# Patient Record
Sex: Male | Born: 1955 | Race: Black or African American | Hispanic: No | Marital: Married | Smoking: Never smoker
Health system: Southern US, Community
[De-identification: ages and names within clinical notes are randomized; demographics above are authoritative.]

## PROBLEM LIST (undated history)

## (undated) DIAGNOSIS — R600 Localized edema: Secondary | ICD-10-CM

## (undated) DIAGNOSIS — I10 Essential (primary) hypertension: Secondary | ICD-10-CM

## (undated) DIAGNOSIS — I219 Acute myocardial infarction, unspecified: Secondary | ICD-10-CM

## (undated) DIAGNOSIS — R0600 Dyspnea, unspecified: Secondary | ICD-10-CM

## (undated) DIAGNOSIS — E119 Type 2 diabetes mellitus without complications: Secondary | ICD-10-CM

## (undated) DIAGNOSIS — U071 COVID-19: Secondary | ICD-10-CM

## (undated) DIAGNOSIS — N185 Chronic kidney disease, stage 5: Secondary | ICD-10-CM

## (undated) DIAGNOSIS — K219 Gastro-esophageal reflux disease without esophagitis: Secondary | ICD-10-CM

## (undated) HISTORY — PX: CHOLECYSTECTOMY: SHX55

## (undated) HISTORY — PX: TOE SURGERY: SHX1073

---

## 2006-01-18 ENCOUNTER — Other Ambulatory Visit: Payer: Self-pay

## 2006-01-18 ENCOUNTER — Emergency Department: Payer: Self-pay | Admitting: Unknown Physician Specialty

## 2006-01-18 ENCOUNTER — Emergency Department: Payer: Self-pay | Admitting: Emergency Medicine

## 2006-03-22 ENCOUNTER — Emergency Department: Payer: Self-pay | Admitting: Emergency Medicine

## 2006-03-23 ENCOUNTER — Emergency Department: Payer: Self-pay | Admitting: Emergency Medicine

## 2006-05-25 ENCOUNTER — Emergency Department: Payer: Self-pay | Admitting: Emergency Medicine

## 2006-07-09 ENCOUNTER — Emergency Department: Payer: Self-pay | Admitting: Emergency Medicine

## 2006-08-16 ENCOUNTER — Ambulatory Visit: Payer: Self-pay | Admitting: Gastroenterology

## 2006-08-21 ENCOUNTER — Emergency Department: Payer: Self-pay | Admitting: Unknown Physician Specialty

## 2006-08-23 ENCOUNTER — Ambulatory Visit: Payer: Self-pay | Admitting: Unknown Physician Specialty

## 2006-08-23 ENCOUNTER — Inpatient Hospital Stay: Payer: Self-pay | Admitting: Unknown Physician Specialty

## 2006-08-23 ENCOUNTER — Other Ambulatory Visit: Payer: Self-pay

## 2006-10-12 ENCOUNTER — Other Ambulatory Visit: Payer: Self-pay

## 2006-10-12 ENCOUNTER — Emergency Department: Payer: Self-pay | Admitting: Unknown Physician Specialty

## 2006-12-24 ENCOUNTER — Emergency Department: Payer: Self-pay | Admitting: Emergency Medicine

## 2006-12-24 ENCOUNTER — Other Ambulatory Visit: Payer: Self-pay

## 2007-02-17 ENCOUNTER — Emergency Department: Payer: Self-pay | Admitting: Emergency Medicine

## 2007-04-07 ENCOUNTER — Emergency Department: Payer: Self-pay | Admitting: Emergency Medicine

## 2007-04-07 ENCOUNTER — Other Ambulatory Visit: Payer: Self-pay

## 2007-05-11 ENCOUNTER — Emergency Department: Payer: Self-pay | Admitting: Emergency Medicine

## 2007-05-11 ENCOUNTER — Other Ambulatory Visit: Payer: Self-pay

## 2007-06-02 ENCOUNTER — Other Ambulatory Visit: Payer: Self-pay

## 2007-06-02 ENCOUNTER — Emergency Department: Payer: Self-pay | Admitting: Emergency Medicine

## 2007-11-09 ENCOUNTER — Emergency Department: Payer: Self-pay | Admitting: Emergency Medicine

## 2009-10-30 ENCOUNTER — Ambulatory Visit: Payer: Self-pay | Admitting: Internal Medicine

## 2009-11-06 ENCOUNTER — Ambulatory Visit: Payer: Self-pay | Admitting: Internal Medicine

## 2009-12-03 ENCOUNTER — Emergency Department: Payer: Self-pay | Admitting: Emergency Medicine

## 2009-12-06 ENCOUNTER — Ambulatory Visit: Payer: Self-pay | Admitting: Internal Medicine

## 2009-12-06 ENCOUNTER — Inpatient Hospital Stay: Payer: Self-pay | Admitting: Internal Medicine

## 2010-12-03 ENCOUNTER — Ambulatory Visit: Payer: Self-pay | Admitting: Podiatry

## 2010-12-05 ENCOUNTER — Ambulatory Visit: Payer: Self-pay | Admitting: Podiatry

## 2010-12-08 LAB — PATHOLOGY REPORT

## 2011-09-14 DIAGNOSIS — N529 Male erectile dysfunction, unspecified: Secondary | ICD-10-CM | POA: Insufficient documentation

## 2012-03-25 DIAGNOSIS — E782 Mixed hyperlipidemia: Secondary | ICD-10-CM | POA: Insufficient documentation

## 2012-03-25 DIAGNOSIS — E559 Vitamin D deficiency, unspecified: Secondary | ICD-10-CM | POA: Insufficient documentation

## 2012-06-17 ENCOUNTER — Other Ambulatory Visit: Payer: Self-pay | Admitting: Podiatry

## 2012-06-23 LAB — WOUND CULTURE

## 2012-07-05 ENCOUNTER — Ambulatory Visit: Payer: Self-pay | Admitting: Podiatry

## 2012-07-05 LAB — BASIC METABOLIC PANEL
Anion Gap: 7 (ref 7–16)
BUN: 29 mg/dL — ABNORMAL HIGH (ref 7–18)
Creatinine: 1.41 mg/dL — ABNORMAL HIGH (ref 0.60–1.30)
EGFR (African American): >60
EGFR (Non-African Amer.): 55 — ABNORMAL LOW
Glucose: 325 mg/dL — ABNORMAL HIGH (ref 65–99)
Osmolality: 290 (ref 275–301)
Potassium: 3.9 mmol/L (ref 3.5–5.1)

## 2012-07-15 ENCOUNTER — Ambulatory Visit: Payer: Self-pay | Admitting: Podiatry

## 2012-07-19 LAB — WOUND CULTURE

## 2012-07-20 LAB — PATHOLOGY REPORT

## 2013-11-03 ENCOUNTER — Emergency Department: Payer: Self-pay | Admitting: Internal Medicine

## 2013-11-03 LAB — CBC WITH DIFFERENTIAL/PLATELET
BASOS PCT: 0.4 %
Basophil #: 0.1 10*3/uL (ref 0.0–0.1)
EOS PCT: 0.4 %
Eosinophil #: 0.1 10*3/uL (ref 0.0–0.7)
HCT: 35.6 % — AB (ref 40.0–52.0)
HGB: 11.4 g/dL — ABNORMAL LOW (ref 13.0–18.0)
Lymphocyte #: 1.1 10*3/uL (ref 1.0–3.6)
Lymphocyte %: 8.6 %
MCH: 27.8 pg (ref 26.0–34.0)
MCHC: 31.9 g/dL — AB (ref 32.0–36.0)
MCV: 87 fL (ref 80–100)
MONO ABS: 1.2 x10 3/mm — AB (ref 0.2–1.0)
MONOS PCT: 9.3 %
Neutrophil #: 10.6 10*3/uL — ABNORMAL HIGH (ref 1.4–6.5)
Neutrophil %: 81.3 %
PLATELETS: 174 10*3/uL (ref 150–440)
RBC: 4.09 10*6/uL — ABNORMAL LOW (ref 4.40–5.90)
RDW: 14.7 % — ABNORMAL HIGH (ref 11.5–14.5)
WBC: 13 10*3/uL — AB (ref 3.8–10.6)

## 2013-11-03 LAB — COMPREHENSIVE METABOLIC PANEL
ANION GAP: 6 — AB (ref 7–16)
Albumin: 2.8 g/dL — ABNORMAL LOW (ref 3.4–5.0)
Alkaline Phosphatase: 240 U/L — ABNORMAL HIGH
BUN: 20 mg/dL — ABNORMAL HIGH (ref 7–18)
Bilirubin,Total: 0.8 mg/dL (ref 0.2–1.0)
CO2: 25 mmol/L (ref 21–32)
Calcium, Total: 8.9 mg/dL (ref 8.5–10.1)
Chloride: 101 mmol/L (ref 98–107)
Creatinine: 1.46 mg/dL — ABNORMAL HIGH (ref 0.60–1.30)
GFR CALC NON AF AMER: 52 — AB
Glucose: 246 mg/dL — ABNORMAL HIGH (ref 65–99)
Osmolality: 275 (ref 275–301)
POTASSIUM: 3.8 mmol/L (ref 3.5–5.1)
SGOT(AST): 93 U/L — ABNORMAL HIGH (ref 15–37)
SGPT (ALT): 82 U/L — ABNORMAL HIGH (ref 12–78)
SODIUM: 132 mmol/L — AB (ref 136–145)
Total Protein: 7.5 g/dL (ref 6.4–8.2)

## 2013-11-06 ENCOUNTER — Inpatient Hospital Stay: Payer: Self-pay | Admitting: Internal Medicine

## 2013-11-06 LAB — COMPREHENSIVE METABOLIC PANEL
ALK PHOS: 532 U/L — AB
ALT: 85 U/L — AB (ref 12–78)
Albumin: 2.5 g/dL — ABNORMAL LOW (ref 3.4–5.0)
Anion Gap: 7 (ref 7–16)
BUN: 19 mg/dL — AB (ref 7–18)
Bilirubin,Total: 0.5 mg/dL (ref 0.2–1.0)
CALCIUM: 8.4 mg/dL — AB (ref 8.5–10.1)
CREATININE: 1.67 mg/dL — AB (ref 0.60–1.30)
Chloride: 96 mmol/L — ABNORMAL LOW (ref 98–107)
Co2: 29 mmol/L (ref 21–32)
GFR CALC AF AMER: 51 — AB
GFR CALC NON AF AMER: 44 — AB
Glucose: 132 mg/dL — ABNORMAL HIGH (ref 65–99)
OSMOLALITY: 269 (ref 275–301)
Potassium: 3.9 mmol/L (ref 3.5–5.1)
SGOT(AST): 44 U/L — ABNORMAL HIGH (ref 15–37)
SODIUM: 132 mmol/L — AB (ref 136–145)
TOTAL PROTEIN: 8.1 g/dL (ref 6.4–8.2)

## 2013-11-06 LAB — CBC WITH DIFFERENTIAL/PLATELET
BASOS ABS: 0.1 10*3/uL (ref 0.0–0.1)
BASOS PCT: 0.3 %
Eosinophil #: 0.1 10*3/uL (ref 0.0–0.7)
Eosinophil %: 0.4 %
HCT: 35 % — AB (ref 40.0–52.0)
HGB: 11.4 g/dL — AB (ref 13.0–18.0)
Lymphocyte #: 1.8 10*3/uL (ref 1.0–3.6)
Lymphocyte %: 11.2 %
MCH: 28 pg (ref 26.0–34.0)
MCHC: 32.6 g/dL (ref 32.0–36.0)
MCV: 86 fL (ref 80–100)
MONO ABS: 1.9 x10 3/mm — AB (ref 0.2–1.0)
Monocyte %: 12 %
Neutrophil #: 12.2 10*3/uL — ABNORMAL HIGH (ref 1.4–6.5)
Neutrophil %: 76.1 %
PLATELETS: 279 10*3/uL (ref 150–440)
RBC: 4.07 10*6/uL — AB (ref 4.40–5.90)
RDW: 14.6 % — ABNORMAL HIGH (ref 11.5–14.5)
WBC: 16.1 10*3/uL — ABNORMAL HIGH (ref 3.8–10.6)

## 2013-11-07 LAB — CBC WITH DIFFERENTIAL/PLATELET
Basophil #: 0.1 10*3/uL (ref 0.0–0.1)
Basophil %: 0.5 %
Eosinophil #: 0.1 10*3/uL (ref 0.0–0.7)
Eosinophil %: 1.1 %
HCT: 32.8 % — ABNORMAL LOW (ref 40.0–52.0)
HGB: 10.8 g/dL — AB (ref 13.0–18.0)
LYMPHS PCT: 11.5 %
Lymphocyte #: 1.5 10*3/uL (ref 1.0–3.6)
MCH: 28.3 pg (ref 26.0–34.0)
MCHC: 32.9 g/dL (ref 32.0–36.0)
MCV: 86 fL (ref 80–100)
Monocyte #: 1.6 x10 3/mm — ABNORMAL HIGH (ref 0.2–1.0)
Monocyte %: 12 %
Neutrophil #: 9.8 10*3/uL — ABNORMAL HIGH (ref 1.4–6.5)
Neutrophil %: 74.9 %
PLATELETS: 261 10*3/uL (ref 150–440)
RBC: 3.82 10*6/uL — AB (ref 4.40–5.90)
RDW: 14.5 % (ref 11.5–14.5)
WBC: 13 10*3/uL — AB (ref 3.8–10.6)

## 2013-11-07 LAB — BASIC METABOLIC PANEL
Anion Gap: 6 — ABNORMAL LOW (ref 7–16)
BUN: 21 mg/dL — AB (ref 7–18)
CO2: 27 mmol/L (ref 21–32)
CREATININE: 1.76 mg/dL — AB (ref 0.60–1.30)
Calcium, Total: 7.9 mg/dL — ABNORMAL LOW (ref 8.5–10.1)
Chloride: 100 mmol/L (ref 98–107)
EGFR (African American): 48 — ABNORMAL LOW
EGFR (Non-African Amer.): 41 — ABNORMAL LOW
GLUCOSE: 120 mg/dL — AB (ref 65–99)
Osmolality: 271 (ref 275–301)
Potassium: 3.4 mmol/L — ABNORMAL LOW (ref 3.5–5.1)
Sodium: 133 mmol/L — ABNORMAL LOW (ref 136–145)

## 2013-11-07 LAB — URINALYSIS, COMPLETE
BACTERIA: NONE SEEN
Bilirubin,UR: NEGATIVE
Blood: NEGATIVE
Glucose,UR: NEGATIVE mg/dL (ref 0–75)
Hyaline Cast: 3
Ketone: NEGATIVE
LEUKOCYTE ESTERASE: NEGATIVE
Nitrite: NEGATIVE
Ph: 5 (ref 4.5–8.0)
Protein: 30
SPECIFIC GRAVITY: 1.014 (ref 1.003–1.030)
Squamous Epithelial: NONE SEEN
WBC UR: 1 /HPF (ref 0–5)

## 2013-11-08 LAB — BASIC METABOLIC PANEL
Anion Gap: 6 — ABNORMAL LOW (ref 7–16)
BUN: 18 mg/dL (ref 7–18)
CHLORIDE: 103 mmol/L (ref 98–107)
Calcium, Total: 8.1 mg/dL — ABNORMAL LOW (ref 8.5–10.1)
Co2: 27 mmol/L (ref 21–32)
Creatinine: 1.54 mg/dL — ABNORMAL HIGH (ref 0.60–1.30)
EGFR (Non-African Amer.): 49 — ABNORMAL LOW
GFR CALC AF AMER: 56 — AB
Glucose: 108 mg/dL — ABNORMAL HIGH (ref 65–99)
OSMOLALITY: 274 (ref 275–301)
Potassium: 3.7 mmol/L (ref 3.5–5.1)
SODIUM: 136 mmol/L (ref 136–145)

## 2013-11-08 LAB — CBC WITH DIFFERENTIAL/PLATELET
BASOS ABS: 0.1 10*3/uL (ref 0.0–0.1)
Basophil %: 0.6 %
EOS ABS: 0.2 10*3/uL (ref 0.0–0.7)
EOS PCT: 1.5 %
HCT: 32.7 % — ABNORMAL LOW (ref 40.0–52.0)
HGB: 10.6 g/dL — AB (ref 13.0–18.0)
LYMPHS ABS: 2 10*3/uL (ref 1.0–3.6)
LYMPHS PCT: 15.1 %
MCH: 27.8 pg (ref 26.0–34.0)
MCHC: 32.2 g/dL (ref 32.0–36.0)
MCV: 86 fL (ref 80–100)
MONO ABS: 1.6 x10 3/mm — AB (ref 0.2–1.0)
Monocyte %: 12.1 %
NEUTROS ABS: 9.6 10*3/uL — AB (ref 1.4–6.5)
Neutrophil %: 70.7 %
PLATELETS: 302 10*3/uL (ref 150–440)
RBC: 3.79 10*6/uL — ABNORMAL LOW (ref 4.40–5.90)
RDW: 14.7 % — ABNORMAL HIGH (ref 11.5–14.5)
WBC: 13.6 10*3/uL — AB (ref 3.8–10.6)

## 2013-11-08 LAB — CULTURE, BLOOD (SINGLE)

## 2013-11-08 LAB — HEMOGLOBIN A1C: HEMOGLOBIN A1C: 10.7 % — AB (ref 4.2–6.3)

## 2013-11-09 LAB — SEDIMENTATION RATE: Erythrocyte Sed Rate: >140 mm/hr — ABNORMAL HIGH (ref 0–20)

## 2013-11-09 LAB — WOUND CULTURE

## 2013-11-10 LAB — CULTURE, BLOOD (SINGLE)

## 2013-11-11 LAB — CULTURE, BLOOD (SINGLE)

## 2013-11-11 LAB — WOUND CULTURE

## 2013-11-14 LAB — PATHOLOGY REPORT

## 2013-11-19 ENCOUNTER — Inpatient Hospital Stay: Payer: Self-pay | Admitting: Family Medicine

## 2013-11-19 LAB — CBC WITH DIFFERENTIAL/PLATELET
BASOS ABS: 0.1 10*3/uL (ref 0.0–0.1)
BASOS PCT: 0.8 %
Eosinophil #: 0.1 10*3/uL (ref 0.0–0.7)
Eosinophil %: 0.9 %
HCT: 36.1 % — AB (ref 40.0–52.0)
HGB: 12 g/dL — ABNORMAL LOW (ref 13.0–18.0)
LYMPHS PCT: 5.2 %
Lymphocyte #: 0.7 10*3/uL — ABNORMAL LOW (ref 1.0–3.6)
MCH: 28.4 pg (ref 26.0–34.0)
MCHC: 33.3 g/dL (ref 32.0–36.0)
MCV: 85 fL (ref 80–100)
MONOS PCT: 2.3 %
Monocyte #: 0.3 x10 3/mm (ref 0.2–1.0)
Neutrophil #: 11.6 10*3/uL — ABNORMAL HIGH (ref 1.4–6.5)
Neutrophil %: 90.8 %
PLATELETS: 399 10*3/uL (ref 150–440)
RBC: 4.24 10*6/uL — ABNORMAL LOW (ref 4.40–5.90)
RDW: 14.6 % — ABNORMAL HIGH (ref 11.5–14.5)
WBC: 12.8 10*3/uL — ABNORMAL HIGH (ref 3.8–10.6)

## 2013-11-19 LAB — COMPREHENSIVE METABOLIC PANEL
ALK PHOS: 197 U/L — AB
ANION GAP: 8 (ref 7–16)
Albumin: 2.8 g/dL — ABNORMAL LOW (ref 3.4–5.0)
BUN: 47 mg/dL — AB (ref 7–18)
Bilirubin,Total: 0.4 mg/dL (ref 0.2–1.0)
CALCIUM: 8.7 mg/dL (ref 8.5–10.1)
CO2: 26 mmol/L (ref 21–32)
CREATININE: 4.82 mg/dL — AB (ref 0.60–1.30)
Chloride: 101 mmol/L (ref 98–107)
EGFR (African American): 14 — ABNORMAL LOW
GFR CALC NON AF AMER: 12 — AB
Glucose: 308 mg/dL — ABNORMAL HIGH (ref 65–99)
OSMOLALITY: 294 (ref 275–301)
POTASSIUM: 4.4 mmol/L (ref 3.5–5.1)
SGOT(AST): 20 U/L (ref 15–37)
SGPT (ALT): 22 U/L (ref 12–78)
SODIUM: 135 mmol/L — AB (ref 136–145)
Total Protein: 9 g/dL — ABNORMAL HIGH (ref 6.4–8.2)

## 2013-11-19 LAB — URINALYSIS, COMPLETE
BACTERIA: NONE SEEN
Bilirubin,UR: NEGATIVE
Glucose,UR: 150 mg/dL (ref 0–75)
Ketone: NEGATIVE
Leukocyte Esterase: NEGATIVE
Nitrite: NEGATIVE
Ph: 6 (ref 4.5–8.0)
Protein: 30
RBC,UR: 2 /HPF (ref 0–5)
SQUAMOUS EPITHELIAL: NONE SEEN
Specific Gravity: 1.008 (ref 1.003–1.030)

## 2013-11-19 LAB — TROPONIN I: Troponin-I: <0.02 ng/mL

## 2013-11-19 LAB — LIPASE, BLOOD: LIPASE: 162 U/L (ref 73–393)

## 2013-11-20 LAB — CBC WITH DIFFERENTIAL/PLATELET
Basophil #: 0 10*3/uL (ref 0.0–0.1)
Basophil %: 0.3 %
Eosinophil #: 0 10*3/uL (ref 0.0–0.7)
Eosinophil %: 0 %
HCT: 35.3 % — ABNORMAL LOW (ref 40.0–52.0)
HGB: 11.5 g/dL — ABNORMAL LOW (ref 13.0–18.0)
LYMPHS PCT: 7 %
Lymphocyte #: 1 10*3/uL (ref 1.0–3.6)
MCH: 28 pg (ref 26.0–34.0)
MCHC: 32.7 g/dL (ref 32.0–36.0)
MCV: 86 fL (ref 80–100)
MONO ABS: 0.7 x10 3/mm (ref 0.2–1.0)
MONOS PCT: 4.8 %
NEUTROS ABS: 12 10*3/uL — AB (ref 1.4–6.5)
Neutrophil %: 87.9 %
Platelet: 402 10*3/uL (ref 150–440)
RBC: 4.11 10*6/uL — ABNORMAL LOW (ref 4.40–5.90)
RDW: 15 % — ABNORMAL HIGH (ref 11.5–14.5)
WBC: 13.7 10*3/uL — ABNORMAL HIGH (ref 3.8–10.6)

## 2013-11-20 LAB — BASIC METABOLIC PANEL
Anion Gap: 9 (ref 7–16)
BUN: 50 mg/dL — ABNORMAL HIGH (ref 7–18)
Calcium, Total: 9 mg/dL (ref 8.5–10.1)
Chloride: 103 mmol/L (ref 98–107)
Co2: 23 mmol/L (ref 21–32)
Creatinine: 4.48 mg/dL — ABNORMAL HIGH (ref 0.60–1.30)
EGFR (African American): 16 — ABNORMAL LOW
EGFR (Non-African Amer.): 13 — ABNORMAL LOW
Glucose: 262 mg/dL — ABNORMAL HIGH (ref 65–99)
Osmolality: 293 (ref 275–301)
POTASSIUM: 4.2 mmol/L (ref 3.5–5.1)
SODIUM: 135 mmol/L — AB (ref 136–145)

## 2013-11-20 LAB — PROTEIN / CREATININE RATIO, URINE
CREATININE, URINE: 85.9 mg/dL (ref 30.0–125.0)
PROTEIN/CREAT. RATIO: 885 mg/g{creat} — AB (ref 0–200)
Protein, Random Urine: 76 mg/dL — ABNORMAL HIGH (ref 0–12)

## 2013-11-20 LAB — OCCULT BLOOD X 1 CARD TO LAB, STOOL: Occult Blood, Feces: NEGATIVE

## 2013-11-21 LAB — CBC WITH DIFFERENTIAL/PLATELET
COMMENT - H1-COM1: NORMAL
COMMENT - H1-COM2: NORMAL
Eosinophil: 1 %
HCT: 40.3 % (ref 40.0–52.0)
HGB: 12.9 g/dL — ABNORMAL LOW (ref 13.0–18.0)
LYMPHS PCT: 15 %
MCH: 27.8 pg (ref 26.0–34.0)
MCHC: 32 g/dL (ref 32.0–36.0)
MCV: 87 fL (ref 80–100)
Monocytes: 4 %
Platelet: 391 10*3/uL (ref 150–440)
RBC: 4.64 10*6/uL (ref 4.40–5.90)
RDW: 15.3 % — AB (ref 11.5–14.5)
Segmented Neutrophils: 79 %
Variant Lymphocyte - H1-Rlymph: 1 %
WBC: 14.2 10*3/uL — AB (ref 3.8–10.6)

## 2013-11-21 LAB — BASIC METABOLIC PANEL
ANION GAP: 10 (ref 7–16)
BUN: 55 mg/dL — ABNORMAL HIGH (ref 7–18)
CREATININE: 4.21 mg/dL — AB (ref 0.60–1.30)
Calcium, Total: 8.8 mg/dL (ref 8.5–10.1)
Chloride: 103 mmol/L (ref 98–107)
Co2: 20 mmol/L — ABNORMAL LOW (ref 21–32)
EGFR (Non-African Amer.): 14 — ABNORMAL LOW
GFR CALC AF AMER: 17 — AB
Glucose: 209 mg/dL — ABNORMAL HIGH (ref 65–99)
OSMOLALITY: 288 (ref 275–301)
POTASSIUM: 4.2 mmol/L (ref 3.5–5.1)
Sodium: 133 mmol/L — ABNORMAL LOW (ref 136–145)

## 2013-11-21 LAB — UR PROT ELECTROPHORESIS, URINE RANDOM

## 2013-11-21 LAB — PROT IMMUNOELECTROPHORES(ARMC)

## 2013-11-21 LAB — KAPPA/LAMBDA FREE LIGHT CHAINS (ARMC)

## 2013-11-22 LAB — HEPATIC FUNCTION PANEL A (ARMC)
ALT: 32 U/L (ref 12–78)
AST: 16 U/L (ref 15–37)
Albumin: 2.8 g/dL — ABNORMAL LOW (ref 3.4–5.0)
Alkaline Phosphatase: 181 U/L — ABNORMAL HIGH
BILIRUBIN DIRECT: 0.1 mg/dL (ref 0.00–0.20)
BILIRUBIN TOTAL: 0.5 mg/dL (ref 0.2–1.0)
Total Protein: 8.2 g/dL (ref 6.4–8.2)

## 2013-11-22 LAB — BASIC METABOLIC PANEL
Anion Gap: 12 (ref 7–16)
BUN: 53 mg/dL — ABNORMAL HIGH (ref 7–18)
CALCIUM: 9.1 mg/dL (ref 8.5–10.1)
CHLORIDE: 100 mmol/L (ref 98–107)
Co2: 21 mmol/L (ref 21–32)
Creatinine: 4.06 mg/dL — ABNORMAL HIGH (ref 0.60–1.30)
EGFR (African American): 17 — ABNORMAL LOW
EGFR (Non-African Amer.): 15 — ABNORMAL LOW
Glucose: 268 mg/dL — ABNORMAL HIGH (ref 65–99)
Osmolality: 290 (ref 275–301)
POTASSIUM: 4.3 mmol/L (ref 3.5–5.1)
SODIUM: 133 mmol/L — AB (ref 136–145)

## 2013-11-23 LAB — BASIC METABOLIC PANEL
Anion Gap: 7 (ref 7–16)
BUN: 52 mg/dL — ABNORMAL HIGH (ref 7–18)
CREATININE: 3.65 mg/dL — AB (ref 0.60–1.30)
Calcium, Total: 8.7 mg/dL (ref 8.5–10.1)
Chloride: 104 mmol/L (ref 98–107)
Co2: 23 mmol/L (ref 21–32)
EGFR (African American): 20 — ABNORMAL LOW
EGFR (Non-African Amer.): 17 — ABNORMAL LOW
Glucose: 206 mg/dL — ABNORMAL HIGH (ref 65–99)
Osmolality: 288 (ref 275–301)
Potassium: 4.4 mmol/L (ref 3.5–5.1)
Sodium: 134 mmol/L — ABNORMAL LOW (ref 136–145)

## 2013-11-24 LAB — BASIC METABOLIC PANEL
Anion Gap: 10 (ref 7–16)
BUN: 51 mg/dL — AB (ref 7–18)
CO2: 23 mmol/L (ref 21–32)
Calcium, Total: 8.8 mg/dL (ref 8.5–10.1)
Chloride: 102 mmol/L (ref 98–107)
Creatinine: 3.79 mg/dL — ABNORMAL HIGH (ref 0.60–1.30)
EGFR (Non-African Amer.): 16 — ABNORMAL LOW
GFR CALC AF AMER: 19 — AB
Glucose: 266 mg/dL — ABNORMAL HIGH (ref 65–99)
OSMOLALITY: 293 (ref 275–301)
Potassium: 4.4 mmol/L (ref 3.5–5.1)
Sodium: 135 mmol/L — ABNORMAL LOW (ref 136–145)

## 2013-11-25 LAB — BASIC METABOLIC PANEL
Anion Gap: 9 (ref 7–16)
BUN: 44 mg/dL — AB (ref 7–18)
CHLORIDE: 105 mmol/L (ref 98–107)
CREATININE: 3.37 mg/dL — AB (ref 0.60–1.30)
Calcium, Total: 8.5 mg/dL (ref 8.5–10.1)
Co2: 24 mmol/L (ref 21–32)
EGFR (African American): 22 — ABNORMAL LOW
EGFR (Non-African Amer.): 19 — ABNORMAL LOW
Glucose: 150 mg/dL — ABNORMAL HIGH (ref 65–99)
Osmolality: 290 (ref 275–301)
Potassium: 4.3 mmol/L (ref 3.5–5.1)
SODIUM: 138 mmol/L (ref 136–145)

## 2013-11-25 LAB — PLATELET COUNT: PLATELETS: 246 10*3/uL (ref 150–440)

## 2013-12-25 ENCOUNTER — Emergency Department: Payer: Self-pay | Admitting: Emergency Medicine

## 2014-11-27 NOTE — Op Note (Signed)
PATIENT NAME:  Donald Silva, Donald Silva MR#:  [id number] DATE OF BIRTH:  1955-10-20  DATE OF PROCEDURE:  07/15/2012  PREOPERATIVE DIAGNOSES:  1. Gastroc equinus, right side.  2. Ulcer, plantar right first metatarsal.   POSTOPERATIVE DIAGNOSES:    1. Gastroc equinus, right side.  2. Ulcer, plantar right first metatarsal.   PROCEDURES:  1. Strayer gastroc recession.  2. Partial excision of bone, right first metatarsal.   SURGEON: Damon Hargrove A. Vickki Muff, DPM.   ANESTHESIA: General.   HEMOSTASIS: Thigh tourniquet inflated to 250 mmHg for 15 minutes.   COMPLICATIONS: None.   SPECIMEN: Bone for pathology and bone for culture.   OPERATIVE INDICATIONS: This is a 59 year old diabetic patient who has previously undergone a great toe amputation who has developed a chronic nonhealing distal first metatarsal ulceration. We have undergone conservative treatment and he presents today for surgery. All risks, benefits, alternatives, and complications associated with the procedure were discussed with the patient in full and consent has been given.   OPERATIVE PROCEDURE: The patient was brought into the Operating Room and placed on the operating table in the supine position. General intubation was administered by the anesthesia team. The posterior aspect and the foot were then prepped and draped in the usual sterile fashion. The foot was then elevated exposing the back of the calf and a longitudinal incision was made proximal to the tendo-Achilles area in the gastrocsoleus junction region. Sharp and blunt dissection was carried down to the peritenon. The peritenon was then exposed. Next, a Strayer gastroc recession was performed. Good excursion was noted afterwards and this was then flushed with copious amounts of irrigation. Layered skin closure was then performed with 3-0 and 4-0 Vicryl for the deep layers and a 3-0 nylon for skin. Attention was then directed to the first metatarsal where after inflation of the tourniquet,  a medial incision was made and this was carried distal to the plantar first metatarsal head and this was circumferential around the ulcerative site. A full thickness removal of the ulcer was then performed. The head of the first metatarsal was then exposed with subperiosteal dissection. This was then transected at about the mid shaft region. This was all smoothed and remodeled with a power saw. The fibular sesamoid was noted and was removed as well. This wound was then flushed with copious amounts of irrigation. No deeper areas of infection were noted, but I removed a small portion of bone for culture and sent the rest of the bone for pathological examination. This was then closed in layers with 2-0 Vicryl for the deep layer, 4-0 Vicryl for the subcutaneous tissue and a 3-0 nylon for the skin. 0.5% Marcaine block was then placed around the area. He tolerated the procedure and anesthesia well and was transported from the Operating Room to the PACU with all vital signs stable and neurovascular status intact. He was placed in an [employer]. He will be allowed for partial weight-bearing on his heel, but should use his walker and crutches most of the time for nonweightbearing. I will see him back in the outpatient clinic in 5 to 7 days. I will give him a prescription for pain medicine.   ____________________________ Pete Glatter. Vickki Muff, DPM jaf:ap D: 07/15/2012 14:31:08 ET T: 07/15/2012 15:17:29 ET JOB#: [id number]  cc: Larkin Ina A. Vickki Muff, DPM, <Dictator> Jayvian Escoe DPM ELECTRONICALLY SIGNED 08/05/2012 9:25

## 2014-12-01 NOTE — Discharge Summary (Signed)
PATIENT NAME:  Donald Silva, Donald Silva MR#:  [id number] DATE OF BIRTH:  1955/12/12  DATE OF ADMISSION:  11/19/2013 DATE OF DISCHARGE:  11/25/2013  REASON FOR ADMISSION: Nausea, vomiting, found to have acute kidney injury.   DISCHARGE DIAGNOSES:  1. Acute kidney injury, possible acute tubular necrosis versus interstitial nephritis  2. Chronic kidney disease stage III.  3. Proteinuria.  4. Nausea and vomiting. Likely secondary to uremia.  5. Osteomyelitis of the left fifth toe.  6. Malignant hypertension.  7. Type 2 diabetes.  IMPORTANT RESULTS: Creatinine on admission 4.82. Previous creatinine at discharge was 1.54. Glucose ranging in between 150 and 250s. LFTs overall within normal limits with a slight elevation of alkaline phosphatase. Albumin 2.8, white count 12.8 on admission up to 14,000, hemoglobin 12,000, and platelet count 399,000. Protein urine ratio is 76, protein creatinine ratio 85. Urinalysis: No signs of urinary tract infection. ANCA panel: Negative complement C3 of 196 so slightly elevated, C4 of 41 slightly elevated, eosinophils not has seen on urine, ANA negative, hepatitis panel negative. Protein electro immunophoresis: A spike on  immunoglobulin G and A and gammaglobulin. Total urine protein 61 mg/dL, free kappa LT change elevated at 71, lambda elevated at 46, occult blood negative.   HISTORY OF PRESENT ILLNESS: So the patient was admitted with a history of significant nausea, vomiting, was found to be in acute kidney injury. Unknown if this was secondary to recently being started on antibiotics Zosyn.  PROBLEMS: The patient was studied for kidney failure with the labs mentioned above. His eosinophils were negative, although they still could not rule out the possibility of acute interstitial nephritis secondary to Zosyn, which was the main medication tha could have caused his AKI.   The patient was dehydrated losing significant amount of fluids and vomiting that could be creating ATN  although the patient did not reverse significantly with IV fluids. He had some improvement and his creatinine is below 4. Nephrology want to keep him until creatinine was around 2 but the patient really wants to go home, and we are going to try to make arrangements. As far as his acute on chronic kidney failure, the patient is stopping the Zosyn and changing it for Cipro and clindamycin and recommend to continue with hydration.   2. Nausea and vomiting. The patient could have some effects of diabetes gastroparesis although at this moment he improved. We are going to hold off on any diagnostics for this as the patient requests. The patient also had kappa and lambda protein spikes on electrophoresis and Dr. Holley Raring has offer him to have a biopsy but the patient is not ready to commit to that. The patient agrees upon checking his creatinine Monday and send those results to Dr. Holley Raring for constant monitoring. At this moment, his volume status is euvolemic.  3. Osteomyelitis. The patient has fifth toe osteomyelitis status post surgery. Infectious disease has been following around. Dr. Ola Spurr has recommended changing the antibiotics to Cipro and Cleocin due to history of methicillin-resistant Staphylococcus aureus osteopenia. The patient was seen by Dr. Vickki Muff here in the hospital but there were not significant changes on management. 4. As far as his antibiotics, needs to continue for 5 more weeks as he already received 1 week. A total of 6 weeks was recommended on previous discharge up to 12/30/2013. The patient is going to be discharged on Cleocin 600 mg 3 times daily and ciprofloxacin 400 mg IV q. 24 hours.  5. As far as his diabetes, the patient  has trouble keeping up anything down for what his insulin was held at the beginning. After he was able to tolerate a diet, the patient was started back on insulin. Blood sugars remained stable. Goal blood sugar is below 180 for now, but at home, he should pursue a  common goal of blood sugars below 120 if possible.  6. As far as his cough, the patient had an x-ray that was negative.  7. As far as his elevation of protein. The patient had kappa lambda. He is going to be followed by Dr. Holley Raring for this for possible biopsy. 8. As far as his hypertension, the patient had significant malignant hypertension. He was taken off hydrochlorothiazide and enalapril due to the acute kidney injury. Now he is going to be going home with oral medications.   MEDICATIONS AT DISCHARGE: Aspirin 81 mg daily, Lantus 52 units once a day, metoprolol 50 mg once daily, amlodipine 10 mg once daily, clindamycin 600 mg IV every eight hours, ciprofloxacin 400 mg IV every 24 hours, Catapres 0.1 mg orally twice daily for blood pressure control. The patient has to complete 5 more weeks of antibiotics as per Dr. Ola Spurr at discharge.   TIME SPENT: I spent about 45 minutes discharging this patient.    ____________________________ [place] Sink, MD rsg:lt D: 11/25/2013 10:56:52 ET T: 11/25/2013 21:51:18 ET JOB#: [id number]  cc: Knowlton Sink, MD, <Dictator> Letha Mirabal America Brown MD ELECTRONICALLY SIGNED 11/26/2013 5:26

## 2014-12-01 NOTE — Consult Note (Signed)
PATIENT NAME:  Donald Silva, Donald Silva MR#:  [id number] DATE OF BIRTH:  14-Sep-1955  DATE OF CONSULTATION:  11/20/2013  INFECTIOUS DISEASE CONSULTATION  REFERRING PHYSICIAN:  Dr. Leslye Peer CONSULTING PHYSICIAN:  Cheral Marker. Ola Spurr, MD  REASON FOR CONSULTATION: Osteomyelitis and acute renal failure.   HISTORY OF PRESENT ILLNESS: This is a 59 year old gentleman who I saw on his last admission who was discharged 04/03 on IV Zosyn for severe foot infection, status post surgical debridement and toe amputation done on 04/02. He had cultures done at that time that grew methicillin-sensitive Staphylococcus aureus, as well as mixed anaerobes. His ESR was greater than 140 and his A1c was 10.7. He was discharged on Zosyn. He had labs drawn and sent to my office 04/07. These showed a creatinine of 1.6. He was admitted yesterday with nausea, vomiting and found to have a creatinine greater than 4. He reports he had been doing okay, but just had not been drinking much fluid over the last few days. Sunday he woke up and was vomiting, could not hold anything down so he came to the Emergency Room. His foot he says has been improving with minimal pain. He has decreased swelling there.   PAST MEDICAL HISTORY: 1.  Poorly controlled diabetes.  2.  Osteomyelitis and abscess of his toe status post amputation as above.  3.  Hypertension.   PAST SURGICAL HISTORY: 1.  Cholecystectomy.  2.  Right toe amputation.  3.  Left toe amputation as above.   SOCIAL HISTORY: Does not smoke or drink. He works at [employer] in Hyd[employer].   FAMILY HISTORY: Positive for hypertension, diabetes and strokes.   ALLERGIES: HE IS INTOLERANT OF CIPROFLOXACIN AND SEPTRA, BOTH OF WHICH CAUSE GI UPSET, NAUSEA AND VOMITING.  REVIEW OF SYSTEMS:  Eleven systems reviewed and negative except as per HPI.  MEDICATIONS SINCE ADMISSION:  The patient was switched to Cipro and clindamycin. Prior to that, he had been on Zosyn 4.5 q.8 hours.   PHYSICAL  EXAMINATION: VITAL SIGNS: Temperature 98.8, pulse 89, blood pressure 167/89, respirations 17, sat 95% on room air.  GENERAL: He is obese, lying in bed in no acute distress.  HEENT: Pupils equal, round and reactive to light and accommodation. Extraocular movements are intact. Sclerae anicteric. Oropharynx is clear. NECK: Supple.  HEART: Regular.  LUNGS: Clear.  ABDOMEN: Obese, soft, nontender.  EXTREMITIES: He has 1+ edema in his right lower extremity.  His foot is wrapped and had just been examined by podiatry.  LABORATORY, DIAGNOSTIC AND RADIOLOGICAL DATA:  Micro data is reviewed from his last admission. Cultures of his wound grew MSSA, as well as greater than 3 anaerobic organisms. The staph aureus was sensitive to clindamycin, oxacillin, ciprofloxacin, gentamicin, levofloxacin, linezolid, tigecycline and Bactrim. Blood cultures x 2 03/30 were negative. Culture on 03/27 also grew heavy growth staph aureus and mixed Bacteroides, beta-lactamase positive. Blood culture 03/27 had staph aureus in 1 of 2 bottles. Followup blood culture 03/30 was negative.  Basic panel showed a creatinine of 4.48, BUN 50. White count 13.7, hemoglobin 11.5, platelets 402. Neutrophil count was elevated at 12. Lymphocyte count was zero. Urinalysis showed less than 1 white cell and 30 red cells. Stool was negative for occult blood.  Renal ultrasound was significant for echogenic kidneys compatible with medical renal disease.  Chest x-ray showed no active cardiopulmonary disease.   IMPRESSION: A 59 year old gentleman with poorly controlled diabetes, admitted with renal failure after being discharged approximately a week ago on IV Zosyn for methicillin-sensitive Staphylococcus  aureus bacteremia and diabetic foot infection status post amputation 04/03. Clinically, he had been improving and creatinine on 02/07 drawn as an outpatient was 1.6. He is now in with acute renal failure after episodes of nausea, vomiting and poor by  mouth intake. It is unclear if the Zosyn is causing the renal failure, but I would suggest holding it. Renal consult is pending. There is no eosinophilia, rash, fevers and no white cells noted on his urinalysis. I would however send a Hansel stain. In order to avoid beta lactams at this point, I would recommend continuing ciprofloxacin and clindamycin.  Thank you for the consult. I will be gland to follow with you.    ____________________________ Cheral Marker. Ola Spurr, MD dpf:ce D: 11/20/2013 14:31:00 ET T: 11/20/2013 15:02:42 ET JOB#: [id number]  cc: Cheral Marker. Ola Spurr, MD, <Dictator> Kripa Foskey Ola Spurr MD ELECTRONICALLY SIGNED 11/21/2013 22:20

## 2014-12-01 NOTE — H&P (Signed)
PATIENT NAME:  Donald, Silva MR#:  [id number] DATE OF BIRTH:  1955-11-25  DATE OF ADMISSION:  11/06/2013   PRIMARY CARE PHYSICIAN: [facility].  REFERRING PHYSICIAN:  Dr. Samara Deist, from podiatry.   CHIEF COMPLAINT: Fever and not feeling well.   HISTORY OF PRESENT ILLNESS: This is a 59 year old male who has past medical history of hypertension and diabetes who also had a history of right big toe amputation 2 years ago and had infection after that a few times. For the last few days, he started feeling that his right foot at the site of amputation is getting infected again so he came to Emergency Room on the 27th of March. Samples were taken and he was sent home with some antibiotics. The patient does not remember which antibiotic. He said that he also had complaint of fever and was feeling somewhat nauseated and he had an appointment today to see Dr. Vickki Muff in the office. When he saw Dr. Vickki Muff in the office, because of this complaint, Dr. Vickki Muff decided to send him to hospital for admission directly. There were other findings also. The cultures, which were collected from his blood and wound, they were positive. His blood culture is positive for gram-positive cocci and so Dr. Vickki Muff decided to send him directly to the hospital for direct admission. On further questioning, the patient denies any cough or urinary symptoms, but he had some fever and chills.   REVIEW OF SYSTEMS:  CONSTITUTIONAL: Negative for fatigue, but positive for fever and some generalized weakness. Denies any weight loss or weight gain.  EYES: No blurring, double vision, discharge or redness.  EARS, NOSE, THROAT: No tinnitus, ear pain or hearing loss.  RESPIRATORY: No cough, wheezing, hemoptysis, or shortness of breath.  CARDIOVASCULAR: No chest pain, orthopnea, edema, arrhythmia or palpitations.  GASTROINTESTINAL: No nausea, vomiting, diarrhea, abdominal pain.  GENITOURINARY: No dysuria, hematuria, or increased frequency.   ENDOCRINE: No heat or cold intolerance.  SKIN: There is some increased redness and warmth on the right feet. Otherwise denies any other rashes.  NEUROLOGICAL: No numbness, weakness, tremor or vertigo.  PSYCHIATRIC: No anxiety, insomnia, bipolar disorder.   PAST MEDICAL HISTORY: Hypertension and diabetes.   PAST SURGICAL HISTORY: Cholecystectomy. Right toe amputation 2 years ago.   SOCIAL HISTORY: Does not smoke. No drinking alcohol. Works at a [employer].   FAMILY HISTORY: Positive for hypertension, diabetes and cerebrovascular accident.   HOME MEDICATIONS:  1. Oxycodone 5 mg every 6 hours as needed for pain.  2. Metformin 1000 mg oral 2 times a day.  3. Lantus 63 units once a day at bedtime.  4. Enalapril 20 mg once a day in the morning.  5. Aspirin 81 mg once a day.  6. Amoxicillin 875 mg oral tablet 2 times a day for 10 days.   PHYSICAL EXAMINATION:  VITAL SIGNS: Temperature 101.2, pulse rate 97, respiratory rate 18, blood pressure 131/84 and pulse oximetry 96 on room air.  GENERAL: The patient is fully alert and oriented to time, place, and person. He is cooperative with history taking and physical examination, slightly upset about being sent home from the Emergency Room on the 27th.  HEENT: Head and neck atraumatic. Oral mucosa moist.  NECK: Supple. No JVD.  RESPIRATORY: Bilateral clear and equal air entry.  CARDIOVASCULAR: S1, S2 present, regular. No murmur.  ABDOMEN: Soft, nontender. Bowel sounds present. No organomegaly.  SKIN: There is a right foot up to ankle redness and warmth somewhat edematous. Dressing present.  Right big toe status post amputation. Leg as mentioned above right ankle edema otherwise no edema on the legs.  NEUROLOGICAL: Power 5/5. Follows commands. Moves all four limbs. No gross abnormality.  PSYCHIATRIC: Does not appear in any acute psychiatric illness at this time.   LABORATORY RESULTS:  Currently the labs were sent when patient  arrived. They are still in the process.   Right foot x-ray is done, which does not show any osteomyelitis signs.   ASSESSMENT AND PLAN: 59 year old male with past medical history of diabetes and hypertension came to Emergency Room with right foot infection on 27th of March to Emergency Room and was sent home after collecting the cultures. He went to Dr. Alvera Singh office today and the cultures was reported to be positive so he was sent in for direct admission as the patient was also not feeling good with amoxicillin, what he was given from the Emergency Room.   1. Sepsis as evident by tachycardia and fever. Lab results are still pending, but his white cell count was elevated 2 days ago on 27th of March at 13,000 and his blood culture 1 out of 2 is positive. His wound culture is positive with Staphylococcus aureus, which is sensitive to oxacillin. We will give him antibiotic therapy and do further work-up as needed. Sent blood culture again as the patient had a fever here.  2. Cellulitis on right foot. Failed outpatient therapy with oral amoxicillin. We will start on vancomycin and Zosyn for now and should be able to taper down the antibiotics, as sensitivity results are available finally. Call podiatric consult with Dr. Vickki Muff for further management in the hospital.  3. Diabetes. Continue his Lantus and insulin sliding scale coverage.  4. Hypertension. Continue enalapril as he was taking at home.  5. Gastrointestinal prophylaxis with pantoprazole.   CODE STATUS: Full code.   TOTAL TIME SPENT: This admission 50 minutes.    ____________________________  Ceasar Lund Anselm Jungling, MD vgv:tc D: 11/06/2013 20:08:50 ET T: 11/06/2013 22:11:24 ET JOB#: [id number]  cc: Ceasar Lund. Anselm Jungling, MD, <Dictator> Pete Glatter. Vickki Muff, DPM  Vaughan Basta MD ELECTRONICALLY SIGNED 11/07/2013 18:32

## 2014-12-01 NOTE — H&P (Signed)
PATIENT NAME:  Donald Silva, Donald Silva MR#:  [id number] DATE OF BIRTH:  1955/11/05  DATE OF ADMISSION:  11/19/2013  PRIMARY CARE PHYSICIAN:  At the [facility]   PODIATRIST:  Dr. Vickki Muff   INFECTIOUS DISEASE:  Dr. Ola Spurr   HISTORY OF PRESENT ILLNESS:  The patient was recently in the hospital 11/06/2013 and discharged 11/10/2013. He had a left toe amputation for osteomyelitis that showed staph aureus and bacteroides, and was sent home on IV Zosyn for 6 weeks. The patient presents to the hospital with nausea/vomiting, and found to have a worsening creatinine of 4.82. The patient has no abdominal pain. No diarrhea. Does feel weak, dehydrated, and unable to keep anything down. Hospitalist services were contacted for further evaluation.   PAST MEDICAL HISTORY: Diabetes, hypertension, osteomyelitis of the fifth toe right foot.   PAST SURGICAL HISTORY: First toe right foot amputation in the past, fifth toe amputation recently.   ALLERGIES: As per patient is none. In the computer listed as CIPRO, but more of a side effect, nausea, vomiting, diarrhea; and SEPTRA.  MEDICATIONS:  As per discharge instructions shows enalapril 20 mg daily, Bayer aspirin 81 mg daily, oxycodone 5 mg every 6 hours as needed, Lantus 52 units at bedtime, metoprolol 25 mg daily, Zosyn 4.5 grams every 8 hours.   FAMILY HISTORY: Diabetes in the family.   REVIEW OF SYSTEMS:  GENERAL: Positive for weakness. No fever, chills or sweats. No weight loss. No weight gain.  EYES: No blurry vision.  EARS, NOSE, MOUTH, THROAT: Positive runny nose. No sore throat. No difficulty swallowing.  CARDIOVASCULAR: No chest pain. No palpitations.  RESPIRATORY: Positive for cough. No sputum. No hemoptysis.  GASTROINTESTINAL: Positive for nausea, vomiting. No abdominal pain. No diarrhea. No constipation. No bright red blood per rectum. No melena. No hematemesis.  GENITOURINARY: Decreased urination.  MUSCULOSKELETAL: No joint pain or muscle pain.   INTEGUMENT: No rashes or eruptions.  NEUROLOGIC: No fainting or blackouts.  PSYCHIATRIC: No anxiety or depression.   PHYSICAL EXAMINATION: VITAL SIGNS: Temperature 98.2, pulse 90, respirations 16, blood pressure 205/108, pulse ox 98% on room air.  GENERAL: No respiratory distress.  EYES: Conjunctivae and lids normal. Pupils equal, round, reactive to light. Extraocular muscles intact. No nystagmus.  EARS, NOSE, MOUTH, THROAT: Tympanic membranes: No erythema. Nasal mucosa: No erythema. Throat:  No erythema, no exudate seen. Lips and gums: No lesions.  CARDIOVASCULAR SYSTEM: S1, S2 normal. No gallops, rubs, or murmurs heard. Carotid upstroke 2+ bilaterally. No bruits. Dorsalis pedis pulses 1/2+ bilaterally.  RESPIRATORY: Scattered rhonchi throughout the lungs. No use of accessory muscles to breathe.  ABDOMEN: Soft, nontender. No organomegaly/splenomegaly. Normoactive bowel sounds. No masses felt.  LYMPHATIC: No lymph nodes in the neck.  MUSCULOSKELETAL: No clubbing, edema or cyanosis.  SKIN: Right fifth toe site, slight blackened area, slightly open. I do see sutures intact, though. I am not seeing any active drainage.  PSYCHIATRIC: The patient is oriented to person, place and time.  NEUROLOGIC: Cranial nerves II through XII grossly intact. Deep tendon reflexes 2+ bilateral lower extremities.   LABORATORY AND RADIOLOGICAL DATA: Troponin negative. White blood cell count 12.8, H and H 12.0 and 36.1, platelet count of 399. Glucose 308, BUN 47, creatinine 4.82, sodium 135, potassium 4.4, chloride 101, CO2 of 26, calcium 8.7. Liver function tests:  Alkaline phosphatase 197, ALT 22, AST 20, albumin 2.8, total protein 9.0.   ASSESSMENT AND PLAN: 1.  Acute on chronic kidney disease. Could be secondary to dehydration, could be secondary to  Zosyn and interstitial nephritis. Will give IV fluid hydration, get a Nephrology consultation, since GFR right now is 14. Hopefully this will improve with IV fluid  hydration and conservative management, and changing antibiotics.   2.  Malignant hypertension. Likely secondary to unable to keep medications down. Need to hold enalapril with the acute renal failure, but I will continue Toprol and give some p.r.n. IV hydralazine.   3.  Nausea, vomiting. Could be uremia. Will continue to monitor. Will give IV Zantac at this point, and will guaiac stools.   4. Osteomyelitis of the fifth toe, status post surgery, I spoke with Infectious Disease, Dr. Ola Spurr. He recommended changing antibiotics to IV Cipro and IV Cleocin. I will put in a consult for Dr. Vickki Muff to evaluate because the edges of the wound are blackened. I am not sure if that is secondary to bleeding into that area, or that part of the scan is not doing too well with necrosis.   5.  Diabetes. Will put on sliding scale.   6.  Cough. Will get a chest x-ray.   7.  Elevated total protein. Will send off a serum protein electrophoresis.  Time spent on admission:  55 minutes.     ____________________________ Tana Conch. Leslye Peer, MD rjw:mr D: 11/19/2013 17:39:49 ET T: 11/19/2013 19:27:01 ET JOB#: [id number]  cc: Tana Conch. Leslye Peer, MD, <Dictator> [facility] A. Vickki Muff, DPM Cheral Marker. Ola Spurr, MD    Marisue Brooklyn MD ELECTRONICALLY SIGNED 11/26/2013 14:17

## 2014-12-01 NOTE — Op Note (Signed)
PATIENT NAME:  Donald Silva, IMGRUND MR#:  [id number] DATE OF BIRTH:  01/25/1956  DATE OF PROCEDURE:  11/09/2013  PREOPERATIVE DIAGNOSIS: Right fifth toe osteomyelitis with fourth web space abscess.   POSTOPERATIVE DIAGNOSIS:  Right fifth toe osteomyelitis with fourth web space abscess.   PROCEDURE: Amputation right fifth toe with debridement of abscess.   COMPLICATIONS: None.   SPECIMEN: Infected right fifth toe.   OPERATIVE INDICATIONS: This is a 59 year old gentleman who was admitted with an infected right fourth web space. He underwent MRI that revealed an abscess in the fourth web space with an infected fifth toe with questionable infected fourth toe osteomyelitis. We have discussed possible surgical intervention and he consents today.    DESCRIPTION OF PROCEDURE: The patient was brought into the OR and placed on the operating table in the supine position. IV sedation was administered by the anesthesia team. Local sedation was administered around the fourth and fifth toe region. After sterile prep and drape, attention was directed to the fifth toe where a fishmouth incision was made around the fifth toe distal to the MTPJ. Full thickness incision was made. This was taken down to the MTPJ and the toe was disarticulated at the joint. There was noted to be purulence and necrotic tissue within the web space, as well as deep around the fifth toe. This was then debrided with a Versajet. Excisional debridement was then performed. The fourth MTPJ was evaluated and there was no obvious erosive changes, no weakness of the bone. The cortical bone looked to be intact at this point. I elected to leave this without any further debridement of bone to the fourth toe joint. The wound was then flushed further with a bulb syringe. Closure was performed with a 5-0 nylon for the skin and the proximal portion of the incision was packed with quarter inch Nu Gauze packing with Iodosorb iodoform. A large bulky sterile dressing  was placed around his right foot. He will be readmitted to the floor. We will continue to monitor until further cultures show definitive sensitivities. Will likely be discharged in the next 1 to 2 days with a PICC line placed. He has been evaluated by infectious disease.     ____________________________ Pete Glatter. Vickki Muff, DPM jaf:sg D: 11/09/2013 13:16:04 ET T: 11/09/2013 13:34:34 ET JOB#: [id number]  cc: Larkin Ina A. Vickki Muff, DPM, <Dictator> Chelsea Nusz DPM ELECTRONICALLY SIGNED 11/16/2013 9:13

## 2014-12-01 NOTE — Discharge Summary (Signed)
Dates of Admission and Diagnosis:  Date of Admission 06-Nov-2013   Date of Discharge 10-Nov-2013   Admitting Diagnosis Right foot cellulitis   Final Diagnosis Right 5th toe osteomyelitis- MSSA   Discharge Diagnosis 1 Sepsis   2 IDDM   3 HTN   4 CKD 3    Chief Complaint/History of Present Illness CHIEF COMPLAINT: Fever and not feeling well.   HISTORY OF PRESENT ILLNESS: This is a 59 year old male who has past medical history of hypertension and diabetes who also had a history of right big toe amputation 2 years ago and had infection after that a few times. For the last few days, he started feeling that his right foot at the site of amputation is getting infected again so he came to Emergency Room on the 27th of March. Samples were taken and he was sent home with some antibiotics. The patient does not remember which antibiotic. He said that he also had complaint of fever and was feeling somewhat nauseated and he had an appointment today to see Dr. Vickki Muff in the office. When he saw Dr. Vickki Muff in the office, because of this complaint, Dr. Vickki Muff decided to send him to hospital for admission directly. There were other findings also. The cultures, which were collected from his blood and wound, they were positive. His blood culture is positive for gram-positive cocci and so Dr. Vickki Muff decided to send him directly to the hospital for direct admission. On further questioning, the patient denies any cough or urinary symptoms, but he had some fever and chills.   Allergies:  Cipro: N/V/Diarrhea  Septra: GI Distress, N/V/Diarrhea    Routine Chem:  01-Apr-15 04:35   Hemoglobin A1c ([facility])  10.7 (The American Diabetes Association recommends that a primary goal of therapy should be <7% and that physicians should reevaluate the treatment regimen in patients with HbA1c values consistently >8%.)  Glucose, Serum  108  BUN 18  Creatinine (comp)  1.54  Sodium, Serum 136  Potassium, Serum 3.7   Chloride, Serum 103  CO2, Serum 27  Calcium (Total), Serum  8.1  Anion Gap  6  Osmolality (calc) 274  eGFR (African American)  56  eGFR (Non-African American)  49 (eGFR values <30m/min/1.73 m2 may be an indication of chronic kidney disease (CKD). Calculated eGFR is useful in patients with stable renal function. The eGFR calculation will not be reliable in acutely ill patients when serum creatinine is changing rapidly. It is not useful in  patients on dialysis. The eGFR calculation may not be applicable to patients at the low and high extremes of body sizes, pregnant women, and vegetarians.)  Routine Hem:  01-Apr-15 04:35   WBC (CBC)  13.6  RBC (CBC)  3.79  Hemoglobin (CBC)  10.6  Hematocrit (CBC)  32.7  Platelet Count (CBC) 302  MCV 86  MCH 27.8  MCHC 32.2  RDW  14.7  Neutrophil % 70.7  Lymphocyte % 15.1  Monocyte % 12.1  Eosinophil % 1.5  Basophil % 0.6  Neutrophil #  9.6  Lymphocyte # 2.0  Monocyte #  1.6  Eosinophil # 0.2  Basophil # 0.1 (Result(s) reported on 08 Nov 2013 at 05:16AM.)  02-Apr-15 06:19   Erythrocyte Sed Rate  > 140 (Result(s) reported on 09 Nov 2013 at 08:24AM.)   PERTINENT RADIOLOGY STUDIES: LabUnknown:    01-Apr-15 09:31, MRI Foot Right WWO  PACS Image   MRI:  MRI Foot Right WWO   REASON FOR EXAM:    right 4th webspace  abscess.  COMMENTS:       PROCEDURE: MR  - MR FOOT RT  WO/W CONTRAST  - Nov 08 2013  9:31AM     CLINICAL DATA:  Open wound.  Pain and swelling.  Diabetic.    EXAM:  MRI OF THE RIGHT FOREFOOT WITHOUT AND WITH CONTRAST    TECHNIQUE:  Multiplanar, multisequence MR imaging was performed both before and  after administration of intravenous contrast.    CONTRAST:  20 cc MultiHance  COMPARISON:  Radiographs 11/06/2013    FINDINGS:  There is an abscess between the fourth and fifth toes. This measures  approximately 3 x 3 x 1.5 cm. There is adjacent osteomyelitis  involving the fourth and fifth proximal phalanges. There is  also  probable septic arthritis involving the fifth interphalangeal joint.  Diffuse surrounding cellulitis and also myofasciitis without  findings for pyomyositis.    Remote surgical changes from a first toe and partial metatarsal  amputation.     IMPRESSION:  Fairly extensive abscess between the fourth and fifth toes which  contains air. There is also adjacent osteomyelitis involving both  the fourth and fifth proximal phalanges. Findings also suspicious  for septic arthritis involving the interphalangeal joint of the  fifth digit.    Cellulitis and myofasciitis without findings for pyomyositis.      Electronically Signed    By: Kalman Jewels M.D.    On: 11/08/2013 10:08         Verified By: Marlane Hatcher, M.D.,   Pertinent Past History:  Pertinent Past History DM   Hospital Course:  Hospital Course 60 yo male DM, HTN, hx of diabetic foot ulcer came into hospital due to fever and not feeling well and noted to have infected diabetic foot ulcer.   1. Sepsis - due to osteomyelitis - Afebrile - MSSA on cx - MRI Foot showing abscess w/ air and underlying Osteo.  s/p right 5th toe amputation and drainange of abscess.  pain control and care as per Podiatry.  - had PICC LIne placed and will need 3-6 weeks of IV abx as per ID.   2. Diabetic foot ulcer/Osteomyelitis - cont. IV Zosyn.  - MRI Foot showing abscess w/ air and underlying Osteo.   - s/p right 5th toe amputation and drainage of abscess.  seen by ID and will need 3-6 weeks of IV abx.  PICC Line placed.  [place] set up.  3. DM - On 50 Units Levemir, SSI  4. HTN - cont. Enalapril.  5. CKD Stage III - Cr. at baseline   Today on PE  S1,S2 Lungs CTA No edema A.O x 3  Time soent on discharge on discharge day 45 min   Condition on Discharge Fair   Code Status:  Code Status Full Code   DISCHARGE INSTRUCTIONS HOME MEDS:  Medication Reconciliation: Patient's Home Medications at Discharge:     Medication  Instructions  enalapril 20 mg oral tablet  1 tab(s) orally once a day (in the morning)    bayer aspirin regimen 81 mg   1 tab(s)  once a day (in the morning)    oxycodone 5 mg oral tablet  1 tab(s) orally every 6 hours, As Needed    lantus solostar pen  52 unit(s)  once a day (at bedtime)   metoprolol succinate 25 mg oral tablet, extended release  1 tab(s) orally once a day   piperacillin-tazobactam  4.5 gram(s)  every 8 hours   rolling walker  1 unit(s)  prn    PRESCRIPTIONS: PRINTED AND PLACED ON CHART  STOP TAKING THE FOLLOWING MEDICATION(S):    metformin 1000 mg oral tablet: 1 tab(s) orally 2 times a day amoxicillin 875 mg tablet: 1 tab(s) orally 2 times a day x 10 days  Physician's Instructions:  Home Health? Yes   Home Health Service Nurse   Diet Carbohydrate Controlled (ADA) Diet   Diet Consistency Regular Consistency   Activity Limitations As tolerated  with a walker   Return to Work after follow up visit with MD   Time frame for Follow Up Appointment 2-4 weeks  Dr. Ola Spurr in 3 weeks   Electronic Signatures: Clayborn Milnes, Lottie Dawson (MD)  (Signed 11-Apr-15 16:39)  Authored: ADMISSION DATE AND DIAGNOSIS, CHIEF COMPLAINT/HPI, Allergies, PERTINENT LABS, PERTINENT RADIOLOGY STUDIES, [place], PATIENT INSTRUCTIONS   Last Updated: 11-Apr-15 16:39 by Alba Destine (MD)

## 2014-12-01 NOTE — Consult Note (Signed)
Brief Consult Note: Diagnosis: SP amputation right 5th toe.   Patient was seen by consultant.   Consult note dictated.   Discussed with Attending MD.   Comments: Wound does have a little necrosis at plantar aspect of incision,but I dont recommend doing anything except continuing to wrap foot, use po shoe, and avoid all pressure from area.  He has an appointment for Dr. Vickki Muff to follow up  on  thursday of this week.  Electronic Signatures: Perry Mount (MD)  (Signed 13-Apr-15 13:21)  Authored: Brief Consult Note   Last Updated: 13-Apr-15 13:21 by Perry Mount (MD)

## 2014-12-01 NOTE — Consult Note (Signed)
Admit Diagnosis:   RT FOOT DIABETIC INFECTION: Onset Date: 07-Nov-2013, Status: Active, Description: RT FOOT DIABETIC INFECTION      Admit Reason:   Sepsis (038.9): Status: Active, Coding System: ICD9, Coded Name: Unspecified septicemia    gerd:    htn:    Diabetes:    Toe surgery:    Cholecystectomy:   Home Medications: Medication Instructions Status  oxyCODONE 5 mg oral tablet 1 tab(s) orally every 6 hours, As Needed Active  amoxicillin 875 mg tablet 1 tab(s) orally 2 times a day x 10 days Active  enalapril 20 mg oral tablet 1 tab(s) orally once a day (in the morning)  Active  Lantus Solostar Pen 63 units subcu once a day (at bedtime)  Active  Bayer Aspirin Regimen 81 mg  1 tab(s)  once a day (in the morning)  Active  metformin 1000 mg oral tablet 1 tab(s) orally 2 times a day Active   Lab Results: Routine Chem:  31-Mar-15 05:03   BUN  21  Creatinine (comp)  1.76  Routine Hem:  30-Mar-15 19:30   WBC (CBC)  16.1  31-Mar-15 05:03   WBC (CBC)  13.0   Radiology Results:  Radiology Results: XRay:    30-Mar-15 19:21, Foot Right Complete  Foot Right Complete  REASON FOR EXAM:    osteomyelitis  COMMENTS:       PROCEDURE: DXR - DXR FOOT RT COMPLETE W/OBLIQUES  - Nov 06 2013  7:21PM     CLINICAL DATA:  Patient has an open wound between the 4th and 5th  digit on the right foot. Foot has been kept wrapped. Hx aputation to  1st digit, diabetes    EXAM:  RIGHT FOOT COMPLETE - 3+ VIEW    COMPARISON:  None.    FINDINGS:  There is no evidence of fracture or dislocation. Osteotomy changes  involving the first metatarsal. There are no secondary signs  reflecting osteomyelitis.     IMPRESSION:  No evidence of acute osseous abnormalities. No secondary  radiographic signs raising concern of osteomyelitis.      Electronically Signed    By: Margaree Mackintosh M.D.    On: 11/06/2013 19:33         Verified By: Mikki Santee, M.D., MD    Cipro:  N/V/Diarrhea  Septra: GI Distress, N/V/Diarrhea  Nursing Flowsheets: **Vital Signs.:   31-Mar-15 13:10  Temperature Source oral    General Aspect Pt resting.  Well known to me.   Present Illness Admitted with recent malaise and chills. Developed infection to right foot last week.  Seen in ER. Xrays negative.  Blood culture with + in 1 bottle with GPC.  On po augmentin and continued to have drainage and general malaise and admitted for futher w/u.   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  Probes to base of proximal phalanz.    Impression Abscess with general sepsis. Xray negative. MRI ordered. Culture taken today from deep wound. Pt may need debridement but will determine after MRI results.   Electronic Signatures: Samara Deist (MD)  (Signed 31-Mar-15 13:18)  Authored: Health Issues, Significant Events - History, Home Medications, Labs, Radiology Results, Allergies, Vital Signs, General Aspect/Present Illness, History and Physical Exam, Impression/Plan   Last Updated: 31-Mar-15 13:18 by Samara Deist (MD)

## 2014-12-01 NOTE — Consult Note (Signed)
PATIENT NAME:  Donald Silva, Donald Silva MR#:  [id number] DATE OF BIRTH:  06/13/1956  DATE OF CONSULTATION:  11/08/2013  CONSULTING PHYSICIAN:  David P. Fitzgerald, MD  REFERRING PHYSICIAN: Dr. Sainani  REASON FOR CONSULT: Osteomyelitis, diabetic foot ulcer.   HISTORY OF PRESENT ILLNESS: This is a 59-year-old with long-standing diabetes, who was admitted March 30 with relatively acute onset of the development of a diabetic foot ulcer. He says he was in his usual state of health until about 2 weeks ago, when he started to notice some pain and drainage at his toe. He came to the Emergency Room, and had labs done on March 27. Blood cultures ended up being positive for MSSA in one of 2 bottles. Wound culture also was taken, and grew Staph aureus as well as anaerobes and Bacteroides. The patient was discharged on an antibiotic, which he is not sure of the name. However, he was seen by Dr. Fowler and direct admitted due to worsening toe infection. Since admission, the patient has been covered with antibiotics. He has had an MRI, which does show extensive osteomyelitis and foot abscess. He is going to go for surgery tomorrow.   The patient reports he has had issues with this before, and has had partial amputation of one toe. He had to follow with the wound care center for several weeks, but did not need IV antibiotics at that time.   PAST MEDICAL HISTORY: 1.  Diabetes.  Follows with the [facility]. He is uncertain of his last A1c.  2.  Hypertension.   PAST SURGICAL HISTORY:  1.  Cholecystectomy.  2.  Right toe amputation 2 years ago.   SOCIAL HISTORY: Does not smoke, does not drink. 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   FAMILY HISTORY: Positive for hypertension, diabetes, and strokes.   ALLERGIES: CIPROFLOXACIN and SEPTRA, both of which cause GI upset, nausea, vomiting.   REVIEW OF SYSTEMS:  Eleven systems reviewed and negative, except as per HPI.    PHYSICAL EXAMINATION: VITAL SIGNS: Temperature 99,  pulse 85, blood pressure 145/78, respirations 18, sat 96% on room air.  T-max on admission was 101.2.  GENERAL: He is obese, sitting in bed, in no acute distress. HEENT: Pupils equal, round, reactive to light and accommodation. Extraocular movements are intact. Sclerae are anicteric. Oropharynx is clear.  NECK: Supple.  HEART: Regular.  LUNGS: Clear to auscultation.  ABDOMEN: Soft, nontender, nondistended. No hepatosplenomegaly.  EXTREMITIES: Has 1+ edema bilateral lower extremity. On his right great toe, in between the first and second, he has an area of necrosis and warmth. I am able to express some pus from it.   DATA: Blood cultures March 27 are growing Staph aureus pansensitive except erythromycin. Wound culture March 27 is growing similar Staph aureus, methicillin sensitive, as well as a gram-negative rod which is anaerobic and beta-lactamase positive. Also Bacteroides with light growth, beta-lactamase negative. Follow-up blood culture March 30 negative. White blood count on admission was 13, currently 13.6, hemoglobin 10.6, platelets of 302. Renal function shows a creatinine of 1.54, estimated GFR of 56.  IMAGING:  MRI of the foot reveals cellulitis and myofasciitis without findings for pyomyositis. There is also adjacent osteomyelitis involving the fourth and fifth proximal phalanges.   IMPRESSION: A 59-year-old with diabetes, admitted with osteomyelitis and abscess, with cultures growing methicillin-susceptible Staphylococcus aureus. He also has MSSA bacteremia. Wound cultures are growing anaerobes as well. He is going for surgery tomorrow.   RECOMMENDATIONS: 1.  Check an ESR and a   hemoglobin A1c.  2.  Agree with surgical debridement.  3.  He will likely need IV antibiotics. He is currently on Zosyn, which will provide coverage for these organisms as well as for possible pseudomonas, which he has had in the past.  4.  Would recommend placing a PICC line, and we can tailor further  outpatient antibiotics based on his final culture results.   Thank you for the consult. I will be glad to follow with you.    ____________________________ David P. Fitzgerald, MD dpf:mr D: 11/08/2013 17:19:08 ET T: 11/08/2013 19:11:48 ET JOB#: [id number]  cc: David P. Fitzgerald, MD, <Dictator> DAVID FITZGERALD MD ELECTRONICALLY SIGNED 11/19/2013 16:21 

## 2014-12-01 NOTE — Consult Note (Signed)
PATIENT NAME:  Donald Silva, Donald Silva MR#:  [id number] DATE OF BIRTH:  [birth year]  DATE OF CONSULTATION:  11/20/2013  REFERRING PHYSICIAN:   CONSULTING PHYSICIAN:  Gerrit Heck. Islay Polanco, DPM  REASON FOR CONSULTATION: Concerns regarding status of postop wound, right fifth toe amputation site.   HISTORY OF PRESENT ILLNESS: Mr. Johnke was admitted yesterday to the hospital because of nausea, vomiting and dehydration. 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 He was okay for about a week with that and then he started developing more significant problems with nausea, vomiting, diarrhea, and some renal issues. They think it may be either renal failure or interstitial cystitis and they are trying to get him stabilized with that and get him well-hydrated. He is feeling better today with no more nausea.   PHYSICAL EXAMINATION:  VITAL SIGNS: Temperature 98.8, pulse 90, respirations 17, blood pressure 167/89, pulse ox is 95%.  SKIN: On examination of his foot, I removed the bandage today. He had a previous right great toe amputation that is well-healed. The right fifth toe was amputated approximately  7 to 10 days ago. He was discharged from the hospital on 04/03. Examination of that area today shows there is slight dehiscence to the wound, but it is not severe. There are a couple of areas of tissue necrosis to the plantar area adjacent to the incision margin, but is not de-hist or gapped at that point.   CLINICAL IMPRESSION: Relatively stable wound. It is a possibility that it is not going to heal because of the ischemic nature of his foot, but uncertain as to the viability at this point.  TREATMENT PLAN: I think the smart thing to do is just wait it out, give it more time to try to heal up and see how well the incision does. He may need to do some remodeling of it at some point depending upon whether the wound continues to exhibit healing. I will re-dress it today as  soon as they are stable with his kidney function and he should be able to go on home. Dr. Ola Spurr was consulted and advised switching him over to IV Cipro along with IV clindamycin instead of Zosyn which is more inclined to produce interstitial cystitis, so we will see how well he does with this change and he is scheduled to see Dr. Vickki Muff on Thursday and I encouraged him to keep that appointment.   ____________________________ Gerrit Heck. Margarine Grosshans, DPM mgt:sb D: 11/20/2013 13:26:04 ET T: 11/20/2013 14:27:07 ET JOB#: [id number]  cc: Gerrit Heck. Tamiya Colello, DPM, <Dictator> Perry Mount MD ELECTRONICALLY SIGNED 12/05/2013 12:54

## 2015-02-02 ENCOUNTER — Emergency Department
Admission: EM | Admit: 2015-02-02 | Discharge: 2015-02-03 | Disposition: A | Discharge status: Home / Self Care | Payer: Self-pay

## 2015-02-03 ENCOUNTER — Encounter: Payer: Self-pay | Admitting: Emergency Medicine

## 2015-02-03 ENCOUNTER — Emergency Department
Admission: EM | Admit: 2015-02-03 | Discharge: 2015-02-03 | Disposition: A | Discharge status: Home / Self Care | Payer: BLUE CROSS/BLUE SHIELD | Attending: Student | Admitting: Student

## 2015-02-03 ENCOUNTER — Emergency Department: Payer: BLUE CROSS/BLUE SHIELD

## 2015-02-03 DIAGNOSIS — Y998 Other external cause status: Secondary | ICD-10-CM | POA: Insufficient documentation

## 2015-02-03 DIAGNOSIS — I129 Hypertensive chronic kidney disease with stage 1 through stage 4 chronic kidney disease, or unspecified chronic kidney disease: Secondary | ICD-10-CM | POA: Diagnosis not present

## 2015-02-03 DIAGNOSIS — L03116 Cellulitis of left lower limb: Secondary | ICD-10-CM | POA: Diagnosis not present

## 2015-02-03 DIAGNOSIS — S80812A Abrasion, left lower leg, initial encounter: Secondary | ICD-10-CM | POA: Insufficient documentation

## 2015-02-03 DIAGNOSIS — N189 Chronic kidney disease, unspecified: Secondary | ICD-10-CM | POA: Insufficient documentation

## 2015-02-03 DIAGNOSIS — Y9389 Activity, other specified: Secondary | ICD-10-CM | POA: Diagnosis not present

## 2015-02-03 DIAGNOSIS — E119 Type 2 diabetes mellitus without complications: Secondary | ICD-10-CM | POA: Diagnosis not present

## 2015-02-03 DIAGNOSIS — Z880 Allergy status to penicillin: Secondary | ICD-10-CM | POA: Insufficient documentation

## 2015-02-03 DIAGNOSIS — S8992XA Unspecified injury of left lower leg, initial encounter: Secondary | ICD-10-CM | POA: Diagnosis present

## 2015-02-03 DIAGNOSIS — X58XXXA Exposure to other specified factors, initial encounter: Secondary | ICD-10-CM | POA: Diagnosis not present

## 2015-02-03 DIAGNOSIS — Y9289 Other specified places as the place of occurrence of the external cause: Secondary | ICD-10-CM | POA: Diagnosis not present

## 2015-02-03 HISTORY — DX: Type 2 diabetes mellitus without complications: E11.9

## 2015-02-03 MED ORDER — CLINDAMYCIN HCL 150 MG PO CAPS
ORAL_CAPSULE | ORAL | Status: AC
  Filled 2015-02-03: qty 2

## 2015-02-03 MED ORDER — BACITRACIN ZINC 500 UNIT/GM EX OINT
TOPICAL_OINTMENT | CUTANEOUS | Status: AC
  Filled 2015-02-03: qty 0.9

## 2015-02-03 MED ORDER — BACITRACIN 500 UNIT/GM EX OINT
1.0000 "application " | TOPICAL_OINTMENT | Freq: Once | CUTANEOUS | Status: AC
  Administered 2015-02-03: 1 via TOPICAL

## 2015-02-03 MED ORDER — CLINDAMYCIN HCL 300 MG PO CAPS: 300.0000 mg | ORAL_CAPSULE | Freq: Three times a day (TID) | ORAL | Status: DC

## 2015-02-03 MED ORDER — CLINDAMYCIN HCL 150 MG PO CAPS
300.0000 mg | ORAL_CAPSULE | Freq: Once | ORAL | Status: AC
  Administered 2015-02-03: 300 mg via ORAL

## 2015-02-03 NOTE — Discharge Instructions (Signed)
Take medication as prescribed. Elevate legs. Use TED hose. Keep abrasions clean with soap and water. Apply topical antibiotic ointment such as Neosporin.  Follow-up with her primary care physician this week. Close monitoring of wound is important.  Return to the ER for new or worsening concerns.  Abrasion An abrasion is a cut or scrape of the skin. Abrasions do not extend through all layers of the skin and most heal within 10 days. It is important to care for your abrasion properly to prevent infection. CAUSES  Most abrasions are caused by falling on, or gliding across, the ground or other surface. When your skin rubs on something, the outer and inner layer of skin rubs off, causing an abrasion. DIAGNOSIS  Your caregiver will be able to diagnose an abrasion during a physical exam.  TREATMENT  Your treatment depends on how large and deep the abrasion is. Generally, your abrasion will be cleaned with water and a mild soap to remove any dirt or debris. An antibiotic ointment may be put over the abrasion to prevent an infection. A bandage (dressing) may be wrapped around the abrasion to keep it from getting dirty.  You may need a tetanus shot if:  You cannot remember when you had your last tetanus shot.  You have never had a tetanus shot.  The injury broke your skin. If you get a tetanus shot, your arm may swell, get red, and feel warm to the touch. This is common and not a problem. If you need a tetanus shot and you choose not to have one, there is a rare chance of getting tetanus. Sickness from tetanus can be serious.  HOME CARE INSTRUCTIONS   If a dressing was applied, change it at least once a day or as directed by your caregiver. If the bandage sticks, soak it off with warm water.   Wash the area with water and a mild soap to remove all the ointment 2 times a day. Rinse off the soap and pat the area dry with a clean towel.   Reapply any ointment as directed by your caregiver. This will  help prevent infection and keep the bandage from sticking. Use gauze over the wound and under the dressing to help keep the bandage from sticking.   Change your dressing right away if it becomes wet or dirty.   Only take over-the-counter or prescription medicines for pain, discomfort, or fever as directed by your caregiver.   Follow up with your caregiver within 24-48 hours for a wound check, or as directed. If you were not given a wound-check appointment, look closely at your abrasion for redness, swelling, or pus. These are signs of infection. SEEK IMMEDIATE MEDICAL CARE IF:   You have increasing pain in the wound.   You have redness, swelling, or tenderness around the wound.   You have pus coming from the wound.   You have a fever or persistent symptoms for more than 2-3 days.  You have a fever and your symptoms suddenly get worse.  You have a bad smell coming from the wound or dressing.  MAKE SURE YOU:   Understand these instructions.  Will watch your condition.  Will get help right away if you are not doing well or get worse. Document Released: 05/06/2005 Document Revised: 07/13/2012 Document Reviewed: 06/30/2011 [facility] Patient Information 2015 [place], [place]. This information is not intended to replace advice given to you by your health care provider. Make sure you discuss any questions you have with your  health care provider.  Cellulitis Cellulitis is an infection of the skin and the tissue beneath it. The infected area is usually red and tender. Cellulitis occurs most often in the arms and lower legs.  CAUSES  Cellulitis is caused by bacteria that enter the skin through cracks or cuts in the skin. The most common types of bacteria that cause cellulitis are staphylococci and streptococci. SIGNS AND SYMPTOMS   Redness and warmth.  Swelling.  Tenderness or pain.  Fever. DIAGNOSIS  Your health care provider can usually determine what is wrong based on a  physical exam. Blood tests may also be done. TREATMENT  Treatment usually involves taking an antibiotic medicine. HOME CARE INSTRUCTIONS   Take your antibiotic medicine as directed by your health care provider. Finish the antibiotic even if you start to feel better.  Keep the infected arm or leg elevated to reduce swelling.  Apply a warm cloth to the affected area up to 4 times per day to relieve pain.  Take medicines only as directed by your health care provider.  Keep all follow-up visits as directed by your health care provider. SEEK MEDICAL CARE IF:   You notice red streaks coming from the infected area.  Your red area gets larger or turns dark in color.  Your bone or joint underneath the infected area becomes painful after the skin has healed.  Your infection returns in the same area or another area.  You notice a swollen bump in the infected area.  You develop new symptoms.  You have a fever. SEEK IMMEDIATE MEDICAL CARE IF:   You feel very sleepy.  You develop vomiting or diarrhea.  You have a general ill feeling (malaise) with muscle aches and pains. MAKE SURE YOU:   Understand these instructions.  Will watch your condition.  Will get help right away if you are not doing well or get worse. Document Released: 05/06/2005 Document Revised: 12/11/2013 Document Reviewed: 10/12/2011 [facility] Patient Information 2015 [place], [place]. This information is not intended to replace advice given to you by your health care provider. Make sure you discuss any questions you have with your health care provider.

## 2015-02-03 NOTE — ED Provider Notes (Signed)
[facility] Emergency Department Provider Note  ____________________________________________  Time seen: Approximately 5:35 PM  I have reviewed the triage vital signs and the nursing notes.   HISTORY  Chief Complaint Abrasion    HPI PRINTESS Donald Silva is a 59 y.o. male presents to the ER with wife at bedside for the complaint of scratch to left lower leg. Patient and spouse reports that 2 days ago patient noticed a small area that looked to be like a scratch and over 2 days it has began to have redness surrounding it. Patient states that the area occurred from him scratching his leg from an outside of his pants. Denies fall or injury. Denies pain. States occasionally itches. Denies fever. Denies numbness or tingling. Denies sensation changes.   Patient reports he has chronic swelling in both of his legs, and reports that his left leg is usually more swollen. Reports swelling goes away after resting his legs or elevating them. Patient states that this has been present at least 3-4 years. Denies fever, chest pain, shortness of breath, abdominal pain, back pain, numbness or tingling.Denies calf pain.  Reports continues to ambulate without difficulty. States continues to work. Wife and pt reports pt has been following with his PCP and nephrologist regarding his leg swelling and states they are adjusting medications to determine cause.   Past Medical History  Diagnosis Date  . Diabetes mellitus without complication   HTN Chronic Kidney disease  There are no active problems to display for this patient.   Past Surgical History  Procedure Laterality Date  . Cholecystectomy    Right great toe and 5 toe amputation: pt reports cut nail too deep and then became infected.  Medications: Metoprolol Amlodipine Lisinopril lantus  Allergies Penicillins  History reviewed. No pertinent family history.  Social History History  Substance Use Topics  . Smoking status:  Never Smoker   . Smokeless tobacco: Not on file  . Alcohol Use: No    Review of Systems Constitutional: No fever/chills Eyes: No visual changes. ENT: No sore throat. Cardiovascular: Denies chest pain. Respiratory: Denies shortness of breath. Gastrointestinal: No abdominal pain.  No nausea, no vomiting.  No diarrhea.  No constipation. Genitourinary: Negative for dysuria. Musculoskeletal: Negative for back pain. BLE swelling, pt reports chronic and unchanged. Skin: Negative for rash. Left lower leg with abrasion and redness Neurological: Negative for headaches, focal weakness or numbness.  10-point ROS otherwise negative.  ____________________________________________   PHYSICAL EXAM:  VITAL SIGNS: ED Triage Vitals  Enc Vitals Group     BP 02/03/15 1649 149/88 mmHg     Pulse Rate 02/03/15 1649 89     Resp 02/03/15 1649 18     Temp 02/03/15 1649 98.2 F (36.8 C)     Temp Source 02/03/15 1649 Oral     SpO2 02/03/15 1649 95 %     Weight 02/03/15 1649 268 lb (121.564 kg)     Height 02/03/15 1649 5\' 11"  (1.803 m)     Head Cir --      Peak Flow --      Pain Score 02/03/15 1650 0     Pain Loc --      Pain Edu? --      Excl. in [place]? --     Constitutional: Alert and oriented. Well appearing and in no acute distress. Eyes: Conjunctivae are normal. PERRL. EOMI. Head: Atraumatic. Nose: No congestion/rhinnorhea. Mouth/Throat: Mucous membranes are moist.  Oropharynx non-erythematous. Neck: No stridor.  No cervical spine  tenderness to palpation. Hematological/Lymphatic/Immunilogical: No cervical lymphadenopathy. Cardiovascular: Normal rate, regular rhythm. Grossly normal heart sounds.  Good peripheral circulation. Respiratory: Normal respiratory effort.  No retractions. Lungs CTAB. Gastrointestinal: Soft and nontender. No distention. No abdominal bruits. No CVA tenderness. Musculoskeletal: Right lower leg mild edema, nonpitting. Right great and 5 toe amputated. Left lower extremity  leg with pitting 2+ ankle and foot edema, per pt chronic and unchanged. Bilateral calf nontender. Bilateral lower extremities non-tender,with full ROM. Bilateral pedal pulses Dorsalis pedis and posterior tibialis equal at 2+ and easily found. No bony tenderness.  Neurologic:  Normal speech and language. No gross focal neurologic deficits are appreciated. Speech is normal. No gait instability. Skin:  Skin is warm, dry and intact. No rash noted. Except: Left lower leg: anterior shin superficial abrasions with mild surrounding erythema, mild warmth to touch immediately surrounding abrasions.NO other erythema. No induration. No fluctuance. No drainage.  Bilateral lower legs with reddish brown discoloration appearance consistent with chronic venous stasis. No ulceration. Pedal pulses equal bilaterally.  Psychiatric: Mood and affect are normal. Speech and behavior are normal.  ____________________________________________   LABS (all labs ordered are listed, but only abnormal results are displayed) Reviewed labs in computer: 12/28/14 BUN: 22, Creatinine : 1.79  Labs Reviewed - No data to display  RADIOLOGY  EXAM: Left LOWER EXTREMITY VENOUS DOPPLER ULTRASOUND  TECHNIQUE: Gray-scale sonography with graded compression, as well as color Doppler and duplex ultrasound were performed to evaluate the lower extremity deep venous systems from the level of the common femoral vein and including the common femoral, femoral, profunda femoral, popliteal and calf veins including the posterior tibial, peroneal and gastrocnemius veins when visible. The superficial great saphenous vein was also interrogated. Spectral Doppler was utilized to evaluate flow at rest and with distal augmentation maneuvers in the common femoral, femoral and popliteal veins.  COMPARISON: None.  FINDINGS: Contralateral Common Femoral Vein: Respiratory phasicity is normal and symmetric with the symptomatic side. No evidence of  thrombus. Normal compressibility.  Common Femoral Vein: No evidence of thrombus. Normal compressibility, respiratory phasicity and response to augmentation.  Saphenofemoral Junction: No evidence of thrombus. Normal compressibility and flow on color Doppler imaging.  Profunda Femoral Vein: No evidence of thrombus. Normal compressibility and flow on color Doppler imaging.  Femoral Vein: No evidence of thrombus. Normal compressibility, respiratory phasicity and response to augmentation.  Popliteal Vein: No evidence of thrombus. Normal compressibility, respiratory phasicity and response to augmentation.  Calf Veins: No evidence of thrombus. Normal compressibility and flow on color Doppler imaging.  Superficial Great Saphenous Vein: No evidence of thrombus. Normal compressibility and flow on color Doppler imaging.  Venous Reflux: None.  Other Findings: None.  IMPRESSION: No evidence of deep venous thrombosis.   Electronically Signed By: Inez Catalina M.D. On: 02/03/2015 18:39 _______________________________________   INITIAL IMPRESSION / ASSESSMENT AND PLAN / ED COURSE  Pertinent labs & imaging results that were available during my care of the patient were reviewed by me and considered in my medical decision making (see chart for details).  Very well-appearing patient. Presents the ER with complaint of abrasion to left anterior shin with mild surrounding erythema. Patient reports he accidentally scratched his shin causing the abrasions. Denies fall or injury. Denies pain. Full range of motion. Denies difficulty with ambulation. Patient also reports chronic swelling to bilateral legs and reports left is usually greater than right. Bilateral lower extremities with reddish brown skin discoloration, per patient present times years, appearance consistent with chronic venous stasis. Pedal pulses  equal bilaterally. Left lower extremity venous ultrasound negative for deep venous  thrombosis.  Will treat patient with oral clindamycin antibiotic for concern of cellulitis and pt PCN allergic. Discussed keeping clean and applying topical antibiotics. Discussed avoiding of scratching. Discussed elevation as well as use TED hose, TED hose applied in ER and sent home with pt. Patient to follow up close with primary care physician this week for follow-up in 2-3 days. Vascular information also given at patient and wife's request. Discussed strict follow-up and return parameters. ____________________________________________   FINAL CLINICAL IMPRESSION(S) / ED DIAGNOSES  Final diagnoses:  Cellulitis of left leg  Abrasion of left leg, initial encounter   Chronic bilateral lower extremity venous insufficiency    Marylene Land, NP 02/03/15 1851  Joanne Gavel, MD 02/03/15 2101

## 2015-02-03 NOTE — ED Notes (Signed)
NAD noted at time of D/C. Pt denies questions or concerns. Pt ambulatory to the lobby at this time.  

## 2015-02-03 NOTE — ED Notes (Signed)
Ted hose applied. NAD noted at this time.

## 2015-02-03 NOTE — ED Notes (Signed)
Pt states that 2 days ago noticed a couple of black marks on lower left leg. Pt states that today in shower noticed that area had opened up. Pt has abrasion noted to lower left leg with bleeding controlled at this time.

## 2015-02-03 NOTE — ED Notes (Signed)
Abrasions to left lower leg, unknown injury x2 days , redden , left leg swelling noted , per pt " that is normal, for me lately", distal pulse  present

## 2017-05-13 ENCOUNTER — Encounter (INDEPENDENT_AMBULATORY_CARE_PROVIDER_SITE_OTHER): Payer: Self-pay | Admitting: Vascular Surgery

## 2017-05-13 ENCOUNTER — Encounter (INDEPENDENT_AMBULATORY_CARE_PROVIDER_SITE_OTHER): Payer: Self-pay

## 2017-06-17 ENCOUNTER — Encounter (INDEPENDENT_AMBULATORY_CARE_PROVIDER_SITE_OTHER): Payer: BLUE CROSS/BLUE SHIELD | Admitting: Vascular Surgery

## 2017-06-17 ENCOUNTER — Encounter (INDEPENDENT_AMBULATORY_CARE_PROVIDER_SITE_OTHER): Payer: BLUE CROSS/BLUE SHIELD

## 2017-06-24 ENCOUNTER — Observation Stay
Admission: EM | Admit: 2017-06-24 | Discharge: 2017-06-25 | Disposition: A | Discharge status: Home / Self Care | Payer: BLUE CROSS/BLUE SHIELD | Attending: Internal Medicine | Admitting: Internal Medicine

## 2017-06-24 ENCOUNTER — Other Ambulatory Visit: Payer: Self-pay

## 2017-06-24 DIAGNOSIS — E1122 Type 2 diabetes mellitus with diabetic chronic kidney disease: Secondary | ICD-10-CM | POA: Diagnosis not present

## 2017-06-24 DIAGNOSIS — E872 Acidosis: Secondary | ICD-10-CM | POA: Diagnosis not present

## 2017-06-24 DIAGNOSIS — N185 Chronic kidney disease, stage 5: Secondary | ICD-10-CM | POA: Insufficient documentation

## 2017-06-24 DIAGNOSIS — E875 Hyperkalemia: Principal | ICD-10-CM | POA: Diagnosis present

## 2017-06-24 DIAGNOSIS — Z7982 Long term (current) use of aspirin: Secondary | ICD-10-CM | POA: Diagnosis not present

## 2017-06-24 DIAGNOSIS — Z794 Long term (current) use of insulin: Secondary | ICD-10-CM | POA: Insufficient documentation

## 2017-06-24 DIAGNOSIS — Z79899 Other long term (current) drug therapy: Secondary | ICD-10-CM | POA: Insufficient documentation

## 2017-06-24 DIAGNOSIS — I120 Hypertensive chronic kidney disease with stage 5 chronic kidney disease or end stage renal disease: Secondary | ICD-10-CM | POA: Diagnosis not present

## 2017-06-24 DIAGNOSIS — Z880 Allergy status to penicillin: Secondary | ICD-10-CM | POA: Diagnosis not present

## 2017-06-24 HISTORY — DX: Chronic kidney disease, stage 5: N18.5

## 2017-06-24 LAB — COMPREHENSIVE METABOLIC PANEL
ALT: 28 U/L (ref 17–63)
AST: 27 U/L (ref 15–41)
Albumin: 3.4 g/dL — ABNORMAL LOW (ref 3.5–5.0)
Alkaline Phosphatase: 98 U/L (ref 38–126)
Anion gap: 7 (ref 5–15)
BUN: 76 mg/dL — AB (ref 6–20)
CO2: 14 mmol/L — ABNORMAL LOW (ref 22–32)
Calcium: 8.5 mg/dL — ABNORMAL LOW (ref 8.9–10.3)
Chloride: 117 mmol/L — ABNORMAL HIGH (ref 101–111)
Creatinine, Ser: 5.64 mg/dL — ABNORMAL HIGH (ref 0.61–1.24)
GFR calc Af Amer: 11 mL/min — ABNORMAL LOW (ref >60)
GFR calc non Af Amer: 10 mL/min — ABNORMAL LOW (ref >60)
GLUCOSE: 111 mg/dL — AB (ref 65–99)
Potassium: 6.6 mmol/L (ref 3.5–5.1)
Sodium: 138 mmol/L (ref 135–145)
TOTAL PROTEIN: 7.6 g/dL (ref 6.5–8.1)
Total Bilirubin: 0.5 mg/dL (ref 0.3–1.2)

## 2017-06-24 LAB — CBC
HCT: 35.4 % — ABNORMAL LOW (ref 40.0–52.0)
HEMOGLOBIN: 11.8 g/dL — AB (ref 13.0–18.0)
MCH: 29.5 pg (ref 26.0–34.0)
MCHC: 33.4 g/dL (ref 32.0–36.0)
MCV: 88.3 fL (ref 80.0–100.0)
Platelets: 203 10*3/uL (ref 150–440)
RBC: 4.01 MIL/uL — AB (ref 4.40–5.90)
RDW: 15.6 % — ABNORMAL HIGH (ref 11.5–14.5)
WBC: 7.8 10*3/uL (ref 3.8–10.6)

## 2017-06-24 LAB — GLUCOSE, CAPILLARY
GLUCOSE-CAPILLARY: 156 mg/dL — AB (ref 65–99)
Glucose-Capillary: 142 mg/dL — ABNORMAL HIGH (ref 65–99)

## 2017-06-24 MED ORDER — DEXTROSE 50 % IV SOLN
25.0000 g | Freq: Once | INTRAVENOUS | Status: AC
  Administered 2017-06-24: 25 g via INTRAVENOUS
  Filled 2017-06-24: qty 50

## 2017-06-24 MED ORDER — ACETAMINOPHEN 325 MG PO TABS: 650.0000 mg | ORAL_TABLET | Freq: Four times a day (QID) | ORAL | Status: DC | PRN

## 2017-06-24 MED ORDER — SODIUM CHLORIDE 0.9 % IV BOLUS (SEPSIS)
1000.0000 mL | Freq: Once | INTRAVENOUS | Status: AC
  Administered 2017-06-24: 1000 mL via INTRAVENOUS

## 2017-06-24 MED ORDER — ONDANSETRON HCL 4 MG PO TABS: 4.0000 mg | ORAL_TABLET | Freq: Four times a day (QID) | ORAL | Status: DC | PRN

## 2017-06-24 MED ORDER — INSULIN ASPART 100 UNIT/ML ~~LOC~~ SOLN
10.0000 [IU] | Freq: Once | SUBCUTANEOUS | Status: AC
  Administered 2017-06-24: 10 [IU] via INTRAVENOUS
  Filled 2017-06-24: qty 1

## 2017-06-24 MED ORDER — SODIUM POLYSTYRENE SULFONATE 15 GM/60ML PO SUSP
30.0000 g | Freq: Once | ORAL | Status: AC
  Administered 2017-06-24: 30 g via ORAL
  Filled 2017-06-24: qty 120

## 2017-06-24 MED ORDER — HEPARIN SODIUM (PORCINE) 5000 UNIT/ML IJ SOLN
5000.0000 [IU] | Freq: Three times a day (TID) | INTRAMUSCULAR | Status: DC
  Administered 2017-06-24 – 2017-06-25 (×2): 5000 [IU] via SUBCUTANEOUS
  Filled 2017-06-24 (×2): qty 1

## 2017-06-24 MED ORDER — INSULIN ASPART 100 UNIT/ML ~~LOC~~ SOLN: 0.0000 [IU] | Freq: Three times a day (TID) | SUBCUTANEOUS | Status: DC

## 2017-06-24 MED ORDER — ONDANSETRON HCL 4 MG/2ML IJ SOLN: 4.0000 mg | Freq: Four times a day (QID) | INTRAMUSCULAR | Status: DC | PRN

## 2017-06-24 MED ORDER — HYDRALAZINE HCL 20 MG/ML IJ SOLN
10.0000 mg | Freq: Four times a day (QID) | INTRAMUSCULAR | Status: DC | PRN
  Administered 2017-06-24: 10 mg via INTRAVENOUS
  Filled 2017-06-24: qty 1

## 2017-06-24 MED ORDER — SODIUM CHLORIDE 0.9 % IV SOLN
INTRAVENOUS | Status: DC
  Administered 2017-06-24: via INTRAVENOUS

## 2017-06-24 MED ORDER — ACETAMINOPHEN 650 MG RE SUPP: 650.0000 mg | Freq: Four times a day (QID) | RECTAL | Status: DC | PRN

## 2017-06-24 MED ORDER — SODIUM CHLORIDE 0.9 % IV SOLN
1.0000 g | Freq: Once | INTRAVENOUS | Status: AC
  Administered 2017-06-24: 1 g via INTRAVENOUS
  Filled 2017-06-24: qty 10

## 2017-06-24 NOTE — ED Triage Notes (Signed)
Pt brought in from home by wife.  Pt had potassium checked this morning and it was 6.7.  Pt is A&Ox4, in NAD, ambulatory to room.  Pt denies chest pain.

## 2017-06-24 NOTE — ED Notes (Signed)
Pt up to toilet in tx room

## 2017-06-24 NOTE — ED Notes (Addendum)
Test: K+ Critical Value: 6.6 Name of Provider Notified: Paduchowski

## 2017-06-24 NOTE — ED Notes (Signed)
FIRST NURSE NOTE: Pt arrives via POV ambulatory. Pt states that he was sent over for potassium problems. Provider that called prior to arrival states that pt has hx of chronic kidney disease, potassium 6.7 today. Pt alert and oriented X4, active, cooperative, pt in NAD. RR even and unlabored, color WNL.

## 2017-06-24 NOTE — ED Provider Notes (Signed)
[facility] Emergency Department Provider Note  Time seen: 6:49 PM  I have reviewed the triage vital signs and the nursing notes.   HISTORY  Chief Complaint Abnormal Lab    HPI Donald Silva is a 61 y.o. male with a past medical history of diabetes, CKD, presents to the emergency department for an elevated potassium.  According to the patient he went to see his nephrologist today for routine checkup had blood work showing a potassium of 6.7 was called back and told to go to the emergency department for evaluation.  Patient denies any chest pain or palpitations.  States he does get short of breath at times with exertion but states that is been ongoing for months.  Patient has chronic kidney disease stage IV, they have spoken about starting dialysis, but the patient states he was not supposed to start until likely later next year.   Past Medical History:  Diagnosis Date  . CKD (chronic kidney disease) stage 5, GFR less than 15 ml/min (HCC)   . Diabetes mellitus without complication ([place])     There are no active problems to display for this patient.   Past Surgical History:  Procedure Laterality Date  . CHOLECYSTECTOMY      Prior to Admission medications   Medication Sig Start Date End Date Taking? Authorizing Provider  clindamycin (CLEOCIN) 300 MG capsule Take 1 capsule (300 mg total) by mouth 3 (three) times daily. For 10 days 02/03/15   Marylene Land, NP    Allergies  Allergen Reactions  . Penicillins     No family history on file.  Social History Social History   Tobacco Use  . Smoking status: Never Smoker  Substance Use Topics  . Alcohol use: No  . Drug use: No    Review of Systems Constitutional: Negative for fever. Cardiovascular: Negative for chest pain. Respiratory: Mild shortness of breath with exertion times months Gastrointestinal: Negative for abdominal pain Genitourinary: Urinates 4 or 5 times daily per  patient Neurological: Negative for headache All other ROS negative  ____________________________________________   PHYSICAL EXAM:  VITAL SIGNS: ED Triage Vitals  Enc Vitals Group     BP 06/24/17 1823 (!) 164/65     Pulse Rate 06/24/17 1823 77     Resp 06/24/17 1823 20     Temp 06/24/17 1823 98.4 F (36.9 C)     Temp Source 06/24/17 1823 Oral     SpO2 06/24/17 1823 100 %     Weight 06/24/17 1824 268 lb (121.6 kg)     Height --      Head Circumference --      Peak Flow --      Pain Score --      Pain Loc --      Pain Edu? --      Excl. in [place]? --     Constitutional: Alert and oriented. Well appearing and in no distress. Eyes: Normal exam ENT   Head: Normocephalic and atraumatic.   Mouth/Throat: Mucous membranes are moist. Cardiovascular: Normal rate, regular rhythm. No murmur Respiratory: Normal respiratory effort without tachypnea nor retractions. Breath sounds are clear  Gastrointestinal: Soft and nontender. No distention. Musculoskeletal: Nontender with normal range of motion in all extremities. Neurologic:  Normal speech and language. No gross focal neurologic deficits Skin:  Skin is warm, dry and intact.  Psychiatric: Mood and affect are normal.   ____________________________________________    EKG  EKG reviewed and interpreted by myself shows sinus rhythm  at 79 bpm, widened QRS with a left axis deviation, nonspecific ST changes but the patient does have peaked T waves in the lateral leads.  EKG is largely unchanged from prior 07/05/12  ____________________________________________   INITIAL IMPRESSION / ASSESSMENT AND PLAN / ED COURSE  Pertinent labs & imaging results that were available during my care of the patient were reviewed by me and considered in my medical decision making (see chart for details).  Patient presents to the emergency department for an elevated potassium.  Differential would include hyperkalemia, renal failure, CHF.  We will check  labs and continue to closely monitor in the emergency department.  If the potassium is elevated greater than 6 we will discuss with nephrology for possible dialysis.  Currently the patient appears well, fairly normal physical examination with no acute findings.  I reviewed the patient's records including [facility] records, patient's creatinine is usually in the 3-4.5 range.  Today patient's creatinine is 5.6 and his potassium is 6.6 which is usually 5 or less.  Given the patient's acute hyperkalemia we will discuss with nephrology for further recommendations and possible admission for dialysis.    Unable to reach nephrology currently.  We will dose insulin glucose and continue with hourly fingersticks.  Patient will be admitted to the hospital for further treatment.  CRITICAL CARE Performed by: Harvest Dark   Total critical care time: 30 minutes  Critical care time was exclusive of separately billable procedures and treating other patients.  Critical care was necessary to treat or prevent imminent or life-threatening deterioration.  Critical care was time spent personally by me on the following activities: development of treatment plan with patient and/or surrogate as well as nursing, discussions with consultants, evaluation of patient's response to treatment, examination of patient, obtaining history from patient or surrogate, ordering and performing treatments and interventions, ordering and review of laboratory studies, ordering and review of radiographic studies, pulse oximetry and re-evaluation of patient's condition.   ____________________________________________   FINAL CLINICAL IMPRESSION(S) / ED DIAGNOSES  Hyperkalemia    Harvest Dark, MD 06/24/17 2015

## 2017-06-24 NOTE — H&P (Addendum)
Donald Silva at [place] NAME: Donald Silva    MR#:  [id number]  DATE OF BIRTH:  [birth year]  DATE OF ADMISSION:  06/24/2017  PRIMARY CARE PHYSICIAN: System, Pcp Not In   REQUESTING/REFERRING PHYSICIAN: Dr Marchelle Folks  CHIEF COMPLAINT:  Abnormal potassium on outpatient labs  HISTORY OF PRESENT ILLNESS:  Donald Silva  is a 60 y.o. male with a known history of CKD stage V, diabetes, hypertension who follows [facility] nephrology comes to the emergency room after his routine lab work as outpatient showed a potassium of 6.7.  He was called and asked to come to the emergency room.  Potassium today is 6.7.  He is currently getting dextrose with insulin, calcium gluconate, Kayexalate. EKG does not show peaked T waves.  Patient is asymptomatic being admitted for further evaluation and management  PAST MEDICAL HISTORY:   Past Medical History:  Diagnosis Date  . CKD (chronic kidney disease) stage 5, GFR less than 15 ml/min (HCC)   . Diabetes mellitus without complication ([place])     PAST SURGICAL HISTOIRY:   Past Surgical History:  Procedure Laterality Date  . CHOLECYSTECTOMY      SOCIAL HISTORY:   Social History   Tobacco Use  . Smoking status: Never Smoker  Substance Use Topics  . Alcohol use: No    FAMILY HISTORY:  No family history on file.  DRUG ALLERGIES:   Allergies  Allergen Reactions  . Penicillins     REVIEW OF SYSTEMS:  Review of Systems  Constitutional: Negative for chills, fever and weight loss.  HENT: Negative for ear discharge, ear pain and nosebleeds.   Eyes: Negative for blurred vision, pain and discharge.  Respiratory: Negative for sputum production, shortness of breath, wheezing and stridor.   Cardiovascular: Negative for chest pain, palpitations, orthopnea and PND.  Gastrointestinal: Negative for abdominal pain, diarrhea, nausea and vomiting.  Genitourinary: Negative for frequency and urgency.   Musculoskeletal: Negative for back pain and joint pain.  Neurological: Positive for weakness. Negative for sensory change, speech change and focal weakness.  Psychiatric/Behavioral: Negative for depression and hallucinations. The patient is not nervous/anxious.      MEDICATIONS AT HOME:   Prior to Admission medications   Medication Sig Start Date End Date Taking? Authorizing Provider  clindamycin (CLEOCIN) 300 MG capsule Take 1 capsule (300 mg total) by mouth 3 (three) times daily. For 10 days 02/03/15   Marylene Land, NP      VITAL SIGNS:  Blood pressure (!) 180/100, pulse 69, temperature 98.4 F (36.9 C), temperature source Oral, resp. rate 19, weight 121.6 kg (268 lb), SpO2 98 %.  PHYSICAL EXAMINATION:  GENERAL:  61 y.o.-year-old patient lying in the bed with no acute distress.  EYES: Pupils equal, round, reactive to light and accommodation. No scleral icterus. Extraocular muscles intact.  HEENT: Head atraumatic, normocephalic. Oropharynx and nasopharynx clear.  NECK:  Supple, no jugular venous distention. No thyroid enlargement, no tenderness.  LUNGS: Normal breath sounds bilaterally, no wheezing, rales,rhonchi or crepitation. No use of accessory muscles of respiration.  CARDIOVASCULAR: S1, S2 normal. No murmurs, rubs, or gallops.  ABDOMEN: Soft, nontender, nondistended. Bowel sounds present. No organomegaly or mass.  EXTREMITIES: No pedal edema, cyanosis, or clubbing.  NEUROLOGIC: Cranial nerves II through XII are intact. Muscle strength 5/5 in all extremities. Sensation intact. Gait not checked.  PSYCHIATRIC: The patient is alert and oriented x 3.  SKIN: No obvious rash, lesion, or ulcer.   LABORATORY PANEL:  CBC Recent Labs  Lab 06/24/17 1837  WBC 7.8  HGB 11.8*  HCT 35.4*  PLT 203   ------------------------------------------------------------------------------------------------------------------  Chemistries  Recent Labs  Lab 06/24/17 1837  NA 138  K 6.6*   CL 117*  CO2 14*  GLUCOSE 111*  BUN 76*  CREATININE 5.64*  CALCIUM 8.5*  AST 27  ALT 28  ALKPHOS 98  BILITOT 0.5   ------------------------------------------------------------------------------------------------------------------  Cardiac Enzymes No results for input(s): TROPONINI in the last 168 hours. ------------------------------------------------------------------------------------------------------------------  RADIOLOGY:  No results found.  EKG:    IMPRESSION AND PLAN:   Donald Silva  is a 61 y.o. male with a known history of CKD stage V, diabetes, hypertension who follows [facility] neph[facility] comes to the emergency room after his routine lab work as outpatient showed a potassium of 6.7.  He was called and asked to come to the emergency room.  Potassium today is 6.7.    1.  Acute hyperkalemia in the setting of CKD stage IV -Admit to telemetry -EKG shows sinus rhythm with right bundle branch block and left anterior fascicular block similar to old EKG -IV calcium gluconate 1 amp, dextrose with insulin, Kayexalate -We will check metabolic panel in few hours -Nephrology consultation with Dr. Candiss Norse.  ER physician spoke with him -Patient is in the process of getting fistula/graft put in for dialysis to be started in the next couple months.  He is followed with [facility] nephrology  2.  Type 2 diabetes -Sliding scale insulin and home meds  3.  Hypertension -Continue home meds  4.  DVT prophylaxis subcu heparin  Was discussed with patient patient's wife  All the records are reviewed and case discussed with ED provider. Management plans discussed with the patient, family and they are in agreement.  CODE STATUS: Full  TOTAL TIME TAKING CARE OF THIS PATIENT: 50 minutes.    Fritzi Mandes M.D on 06/24/2017 at 8:46 PM  Between 7am to 6pm - Pager - [phone]  After 6pm go to [email or web address] - password [email or web address]  Office  [phone]  CC: Primary care  physician; System, Pcp Not In

## 2017-06-24 NOTE — ED Notes (Signed)
Family at bedside. 

## 2017-06-24 NOTE — ED Notes (Signed)
Admitting MD at bedside.

## 2017-06-24 NOTE — ED Notes (Signed)
THis RN to administer stat meds prior to transport.

## 2017-06-24 NOTE — ED Notes (Signed)
Patient is resting comfortably at this time with no signs of distress present. VS stable. Will continue to monitor.   

## 2017-06-24 NOTE — ED Notes (Signed)
Blood glucose 156.

## 2017-06-25 LAB — BASIC METABOLIC PANEL
ANION GAP: 7 (ref 5–15)
BUN: 71 mg/dL — ABNORMAL HIGH (ref 6–20)
CALCIUM: 8.3 mg/dL — AB (ref 8.9–10.3)
CO2: 14 mmol/L — AB (ref 22–32)
Chloride: 119 mmol/L — ABNORMAL HIGH (ref 101–111)
Creatinine, Ser: 5.66 mg/dL — ABNORMAL HIGH (ref 0.61–1.24)
GFR, EST AFRICAN AMERICAN: 11 mL/min — AB (ref >60)
GFR, EST NON AFRICAN AMERICAN: 10 mL/min — AB (ref >60)
GLUCOSE: 113 mg/dL — AB (ref 65–99)
POTASSIUM: 5.3 mmol/L — AB (ref 3.5–5.1)
Sodium: 140 mmol/L (ref 135–145)

## 2017-06-25 LAB — GLUCOSE, CAPILLARY: Glucose-Capillary: 115 mg/dL — ABNORMAL HIGH (ref 65–99)

## 2017-06-25 MED ORDER — HYDRALAZINE HCL 10 MG PO TABS
10.0000 mg | ORAL_TABLET | Freq: Three times a day (TID) | ORAL | Status: DC
  Filled 2017-06-25: qty 1

## 2017-06-25 MED ORDER — HYDRALAZINE HCL 10 MG PO TABS: 10.0000 mg | ORAL_TABLET | Freq: Three times a day (TID) | ORAL | 0 refills | Status: DC

## 2017-06-25 NOTE — Consult Note (Signed)
Date: 06/25/2017                  Patient Name:  Donald Silva  MRN: [id number]  DOB: 01/03/1956  Age / Sex: 61 y.o., male         PCP: Letta Median, MD                 Service Requesting Consult: ER/ Max Sane, MD                 Reason for Consult: Hyperkalemia, CKD            History of Present Illness: Patient is a 62 y.o. male with medical problems of CKD st 5, HTN, DM-2/CKD, who was admitted to [facility] on 06/24/2017 for evaluation of Hyperkalemia.    He is followed by [facility] nephrology.  Outpatient lab work showed that his potassium was 6.7.  He was asked to go to the emergency room for evaluation In the ER, his repeat potassium was 6.6 He was treated medically shifting measures and given Kayexalate. This morning, repeat potassium is 5.3 Urgent nephrology consult requested for evaluation for dialysis  Medications: Outpatient medications: Medications Prior to Admission  Medication Sig Dispense Refill Last Dose  . amLODipine (NORVASC) 10 MG tablet Take 10 mg daily by mouth.   06/24/2017 at 0800  . aspirin EC 81 MG tablet Take 81 mg daily by mouth.   06/24/2017 at 0800  . b complex-vitamin c-folic acid (NEPHRO-VITE) 0.8 MG TABS tablet Take 1 tablet daily by mouth.   06/24/2017 at 0800  . insulin glargine (LANTUS) 100 UNIT/ML injection Inject 77 Units at bedtime into the skin.   06/23/2017 at 2200  . lisinopril (PRINIVIL,ZESTRIL) 40 MG tablet Take 40 mg daily by mouth.   06/24/2017 at 0800  . metoprolol succinate (TOPROL-XL) 50 MG 24 hr tablet Take 50 mg daily by mouth.   06/24/2017 at 0800  . sodium bicarbonate 650 MG tablet Take 1,300 mg 2 (two) times daily by mouth.   06/24/2017 at 0800  . Vitamin D, Ergocalciferol, (DRISDOL) 50000 units CAPS capsule Take 50,000 Units every 7 (seven) days by mouth.   Past Week at Unknown time    Current medications: Current Facility-Administered Medications  Medication Dose Route Frequency Provider Last Rate Last Dose  . 0.9 %   sodium chloride infusion   Intravenous Continuous Fritzi Mandes, MD 50 mL/hr at 06/24/17 2344    . acetaminophen (TYLENOL) tablet 650 mg  650 mg Oral Q6H PRN Fritzi Mandes, MD       Or  . acetaminophen (TYLENOL) suppository 650 mg  650 mg Rectal Q6H PRN Fritzi Mandes, MD      . heparin injection 5,000 Units  5,000 Units Subcutaneous Q8H Fritzi Mandes, MD   5,000 Units at 06/25/17 [phone]  . hydrALAZINE (APRESOLINE) injection 10 mg  10 mg Intravenous Q6H PRN Fritzi Mandes, MD   10 mg at 06/24/17 2137  . hydrALAZINE (APRESOLINE) tablet 10 mg  10 mg Oral Q8H Shah, Vipul, MD      . insulin aspart (novoLOG) injection 0-9 Units  0-9 Units Subcutaneous TID WC Fritzi Mandes, MD      . ondansetron [facility]) tablet 4 mg  4 mg Oral Q6H PRN Fritzi Mandes, MD       Or  . ondansetron (ZOFRAN) injection 4 mg  4 mg Intravenous Q6H PRN Fritzi Mandes, MD          Allergies: Allergies  Allergen Reactions  .  Penicillins       Past Medical History: Past Medical History:  Diagnosis Date  . CKD (chronic kidney disease) stage 5, GFR less than 15 ml/min (HCC)   . Diabetes mellitus without complication [facility])      Past Surgical History: Past Surgical History:  Procedure Laterality Date  . CHOLECYSTECTOMY       Family History: No family history on file.   Social History: Social History   Socioeconomic History  . Marital status: Single    Spouse name: Not on file  . Number of children: Not on file  . Years of education: Not on file  . Highest education level: Not on file  Social Needs  . Financial resource strain: Not on file  . Food insecurity - worry: Not on file  . Food insecurity - inability: Not on file  . Transportation needs - medical: Not on file  . Transportation needs - non-medical: Not on file  Occupational History  . Not on file  Tobacco Use  . Smoking status: Never Smoker  . Smokeless tobacco: Never Used  Substance and Sexual Activity  . Alcohol use: No  . Drug use: No  . Sexual activity:  Not on file  Other Topics Concern  . Not on file  Social History Narrative  . Not on file     Review of Systems: Gen: No fevers or chills HEENT: No vision or hearing problems CV: No chest pain or shortness of breath Resp: No cough or sputum production GI: No nausea, vomiting or change in taste.  Appetite is good GU : No problems with voiding.  No blood in the urine MS: No joint pains Derm:  No rashes Psych: No complaints Heme: No complaints  neuro: No complaints Endocrine.  No complaints  Vital Signs: Blood pressure 119/75, pulse 74, temperature 98 F (36.7 C), temperature source Oral, resp. rate 17, weight 115.9 kg (255 lb 8 oz), SpO2 100 %.   Intake/Output Summary (Last 24 hours) at 06/25/2017 1012 Last data filed at 06/25/2017 0700 Gross per 24 hour  Intake 363.33 ml  Output 100 ml  Net 263.33 ml    Weight trends: Filed Weights   06/24/17 1824 06/24/17 2208  Weight: 121.6 kg (268 lb) 115.9 kg (255 lb 8 oz)    Physical Exam: General:  No acute distress, laying in the bed  HEENT  anicteric, moist oral mucous membranes  Neck:  Supple  Lungs:  Lungs are clear to auscultation bilaterally  Heart::  Regular rate and rhythm no rub or gallop  Abdomen:  Soft, nontender  Extremities:  No peripheral edema  Neurologic:  Alert, oriented  Skin:  No acute rashes             Lab results: Basic Metabolic Panel: Recent Labs  Lab 06/24/17 1837 06/25/17 0758  NA 138 140  K 6.6* 5.3*  CL 117* 119*  CO2 14* 14*  GLUCOSE 111* 113*  BUN 76* 71*  CREATININE 5.64* 5.66*  CALCIUM 8.5* 8.3*    Liver Function Tests: Recent Labs  Lab 06/24/17 1837  AST 27  ALT 28  ALKPHOS 98  BILITOT 0.5  PROT 7.6  ALBUMIN 3.4*   No results for input(s): LIPASE, AMYLASE in the last 168 hours. No results for input(s): AMMONIA in the last 168 hours.  CBC: Recent Labs  Lab 06/24/17 1837  WBC 7.8  HGB 11.8*  HCT 35.4*  MCV 88.3  PLT 203    Cardiac Enzymes: No results  for input(s): CKTOTAL, TROPONINI in the last 168 hours.  BNP: Invalid input(s): POCBNP  CBG: Recent Labs  Lab 06/24/17 2054 06/24/17 2351 06/25/17 0738  GLUCAP 156* 142* 115*    Microbiology: No results found for this or any previous visit (from the past 720 hour(s)).   Coagulation Studies: No results for input(s): LABPROT, INR in the last 72 hours.  Urinalysis: No results for input(s): COLORURINE, LABSPEC, PHURINE, GLUCOSEU, HGBUR, BILIRUBINUR, KETONESUR, PROTEINUR, UROBILINOGEN, NITRITE, LEUKOCYTESUR in the last 72 hours.  Invalid input(s): APPERANCEUR      Imaging:  No results found.   Assessment & Plan: Pt is a 61 y.o. African-American  male with chronic kidney disease stage V, diabetes, hypertension, was admitted on 06/24/2017 with hyperkalemia.  1.   Hyperkalemia -No EKG changes in the emergency room -Treated with shifting measures and Kayexalate.  His potassium is 5.3 this morning -Discontinue lisinopril -Low potassium diet discussed with patient  2.  Chronic kidney disease stage V -No uremic symptoms.  Patient will continue to follow-up with his outpatient team No acute indication for dialysis at present  3.  Acidosis Continue sodium bicarbonate-   4.  Diabetes type 2 with chronic kidney disease  No recent hemoglobin A1c available  5.  Hypertension Discontinue lisinopril until patient is started on dialysis Agree with starting low-dose hydralazine for blood pressure control

## 2017-06-25 NOTE — Discharge Instructions (Signed)
Hyperkalemia °Hyperkalemia is when you have too much potassium in your blood. Potassium is normally removed (excreted) from your body by your kidneys. If there is too much potassium in your blood, it can affect how your heart works. °Follow these instructions at home: °· Take medicines only as told by your doctor. °· Do not take any supplements, natural products, herbs, or vitamins unless your doctor says it is okay. °· Limit your alcohol intake as told by your doctor. °· Stop illegal drug use. If you need help quitting, ask your doctor. °· Keep all follow-up visits as told by your doctor. This is important. °· If you have kidney disease, you may need to follow a low potassium diet. A food specialist (dietitian) can help you. °Contact a doctor if: °· Your heartbeat is not regular or very slow. °· You feel dizzy (light-headed). °· You feel weak. °· You feel sick to your stomach (nauseous). °· You have tingling in your hands or feet. °· You cannot feel your hands or feet. °Get help right away if: °· You are short of breath. °· You have chest pain. °· You pass out (faint). °· You cannot move your muscles. °This information is not intended to replace advice given to you by your health care provider. Make sure you discuss any questions you have with your health care provider. °Document Released: 07/27/2005 Document Revised: 01/02/2016 Document Reviewed: 11/01/2013 °Elsevier Interactive Patient Education © 2018 Elsevier Inc. ° °

## 2017-06-25 NOTE — Progress Notes (Signed)
Went over discharge instruction with the patient including medication and follow-up appointment. Serenity, RN discontinue peripheral IV and telemetry monitor. Truman Hayward, NT help patient for transport.

## 2017-06-25 NOTE — Progress Notes (Signed)
CH provided HCPOA education to patient and his wife. Patient will contact [facility] office when ready to complete as patient and wife are working on completing forms. Patient was alert and appeared to be in good spirits as he stated that things were going well.    06/25/17 0900  Clinical Encounter Type  Visited With Patient;Family;Patient and family together;Health care provider  Visit Type Initial;Spiritual support  Referral From Nurse  Spiritual Encounters  Spiritual Needs Emotional

## 2017-06-25 NOTE — Plan of Care (Signed)
Patient admitted about 2200. No complaints of pain. Patient is drowsy. Spouse is at the bedside. No complications noted at this time.

## 2017-06-27 NOTE — Discharge Summary (Signed)
[place] at [place] NAME: Donald Silva    MR#:  [id number]  DATE OF BIRTH:  09-28-1955  DATE OF ADMISSION:  06/24/2017   ADMITTING PHYSICIAN: Fritzi Mandes, MD  DATE OF DISCHARGE: 06/25/2017 11:34 AM  PRIMARY CARE PHYSICIAN: Letta Median, MD   ADMISSION DIAGNOSIS:  Hyperkalemia [E87.5] DISCHARGE DIAGNOSIS:  Active Problems:   Hyperkalemia  SECONDARY DIAGNOSIS:   Past Medical History:  Diagnosis Date  . CKD (chronic kidney disease) stage 5, GFR less than 15 ml/min (HCC)   . Diabetes mellitus without complication [facility])    HOSPITAL COURSE:  61 y.o. male with a known history of CKD stage V, diabetes, hypertension who follows [facility] nephrology comes to the emergency room after his routine lab work as outpatient showed a potassium of 6.7. He received dextrose with insulin, calcium gluconate, Kayexalate in ED. EKG did not show peaked T waves.  Patient is asymptomatic being admitted for further evaluation and management   1.   Hyperkalemia -No EKG changes in the emergency room -Treated with shifting measures and Kayexalate.  His potassium is 5.3 this morning -Discontinue lisinopril at DC -Low potassium diet discussed with patient  2.  Chronic kidney disease stage V -No uremic symptoms.  Patient will continue to follow-up with his outpatient team No acute indication for dialysis at present  3.  Acidosis - treated with sodium bicarbonate.   4.  Diabetes type 2 with chronic kidney disease  - outpt mgmt  5.  Hypertension Discontinue lisinopril until patient is started on dialysis -starting low-dose hydralazine for blood pressure control DISCHARGE CONDITIONS:  stable CONSULTS OBTAINED:  Treatment Team:  Murlean Iba, MD DRUG ALLERGIES:   Allergies  Allergen Reactions  . Penicillins    DISCHARGE MEDICATIONS:   Allergies as of 06/25/2017      Reactions   Penicillins       Medication List    STOP taking these  medications   lisinopril 40 MG tablet Commonly known as:  PRINIVIL,ZESTRIL     TAKE these medications   amLODipine 10 MG tablet Commonly known as:  NORVASC Take 10 mg daily by mouth.   aspirin EC 81 MG tablet Take 81 mg daily by mouth.   b complex-vitamin c-folic acid 0.8 MG Tabs tablet Take 1 tablet daily by mouth.   hydrALAZINE 10 MG tablet Commonly known as:  APRESOLINE Take 1 tablet (10 mg total) every 8 (eight) hours by mouth.   insulin glargine 100 UNIT/ML injection Commonly known as:  LANTUS Inject 77 Units at bedtime into the skin.   metoprolol succinate 50 MG 24 hr tablet Commonly known as:  TOPROL-XL Take 50 mg daily by mouth.   sodium bicarbonate 650 MG tablet Take 1,300 mg 2 (two) times daily by mouth.   Vitamin D (Ergocalciferol) 50000 units Caps capsule Commonly known as:  DRISDOL Take 50,000 Units every 7 (seven) days by mouth.        DISCHARGE INSTRUCTIONS:   DIET:  Renal diet DISCHARGE CONDITION:  Good ACTIVITY:  Activity as tolerated OXYGEN:  Home Oxygen: No.  Oxygen Delivery: room air DISCHARGE LOCATION:  home   If you experience worsening of your admission symptoms, develop shortness of breath, life threatening emergency, suicidal or homicidal thoughts you must seek medical attention immediately by calling 911 or calling your MD immediately  if symptoms less severe.  You Must read complete instructions/literature along with all the possible adverse reactions/side effects for all the Medicines  you take and that have been prescribed to you. Take any new Medicines after you have completely understood and accpet all the possible adverse reactions/side effects.   Please note  You were cared for by a hospitalist during your hospital stay. If you have any questions about your discharge medications or the care you received while you were in the hospital after you are discharged, you can call the unit and asked to speak with the hospitalist on  call if the hospitalist that took care of you is not available. Once you are discharged, your primary care physician will handle any further medical issues. Please note that NO REFILLS for any discharge medications will be authorized once you are discharged, as it is imperative that you return to your primary care physician (or establish a relationship with a primary care physician if you do not have one) for your aftercare needs so that they can reassess your need for medications and monitor your lab values.    On the day of Discharge:  VITAL SIGNS:  Blood pressure 119/75, pulse 74, temperature 98 F (36.7 C), temperature source Oral, resp. rate 17, weight 115.9 kg (255 lb 8 oz), SpO2 100 %. PHYSICAL EXAMINATION:  GENERAL:  61 y.o.-year-old patient lying in the bed with no acute distress.  EYES: Pupils equal, round, reactive to light and accommodation. No scleral icterus. Extraocular muscles intact.  HEENT: Head atraumatic, normocephalic. Oropharynx and nasopharynx clear.  NECK:  Supple, no jugular venous distention. No thyroid enlargement, no tenderness.  LUNGS: Normal breath sounds bilaterally, no wheezing, rales,rhonchi or crepitation. No use of accessory muscles of respiration.  CARDIOVASCULAR: S1, S2 normal. No murmurs, rubs, or gallops.  ABDOMEN: Soft, non-tender, non-distended. Bowel sounds present. No organomegaly or mass.  EXTREMITIES: No pedal edema, cyanosis, or clubbing.  NEUROLOGIC: Cranial nerves II through XII are intact. Muscle strength 5/5 in all extremities. Sensation intact. Gait not checked.  PSYCHIATRIC: The patient is alert and oriented x 3.  SKIN: No obvious rash, lesion, or ulcer.  DATA REVIEW:   CBC Recent Labs  Lab 06/24/17 1837  WBC 7.8  HGB 11.8*  HCT 35.4*  PLT 203    Chemistries  Recent Labs  Lab 06/24/17 1837 06/25/17 0758  NA 138 140  K 6.6* 5.3*  CL 117* 119*  CO2 14* 14*  GLUCOSE 111* 113*  BUN 76* 71*  CREATININE 5.64* 5.66*  CALCIUM  8.5* 8.3*  AST 27  --   ALT 28  --   ALKPHOS 98  --   BILITOT 0.5  --      Follow-up Information    Letta Median, MD. Go on 07/13/2017.   Specialty:  Family Medicine Why:  at 1 pm Contact information: [place] [address] [phone]        Franciso Bend, MD. Go on 06/30/2017.   Specialty:  Nephrology Why:  at 8:30 am  at office in [facility] information: [address] [place] [address] [phone]           Management plans discussed with the patient, family and they are in agreement.  CODE STATUS: Prior   TOTAL TIME TAKING CARE OF THIS PATIENT: 45 minutes.    Max Sane M.D on 06/27/2017 at 4:01 PM  Between 7am to 6pm - Pager - [phone]  After 6pm go to [email or web address] - [facility]  Office  [phone]  CC:  Primary care physician; Letta Median, MD   Note: This dictation was prepared with Dragon dictation along with smaller phrase technology. Any transcriptional errors that result from this process are unintentional.

## 2017-08-04 ENCOUNTER — Encounter (INDEPENDENT_AMBULATORY_CARE_PROVIDER_SITE_OTHER): Payer: BLUE CROSS/BLUE SHIELD

## 2017-08-04 ENCOUNTER — Encounter (INDEPENDENT_AMBULATORY_CARE_PROVIDER_SITE_OTHER): Payer: BLUE CROSS/BLUE SHIELD | Admitting: Vascular Surgery

## 2017-08-11 ENCOUNTER — Ambulatory Visit (INDEPENDENT_AMBULATORY_CARE_PROVIDER_SITE_OTHER): Payer: BLUE CROSS/BLUE SHIELD | Admitting: Vascular Surgery

## 2017-08-11 ENCOUNTER — Other Ambulatory Visit (INDEPENDENT_AMBULATORY_CARE_PROVIDER_SITE_OTHER): Payer: BLUE CROSS/BLUE SHIELD

## 2017-08-11 ENCOUNTER — Other Ambulatory Visit (INDEPENDENT_AMBULATORY_CARE_PROVIDER_SITE_OTHER): Payer: Self-pay | Admitting: Vascular Surgery

## 2017-08-11 ENCOUNTER — Encounter (INDEPENDENT_AMBULATORY_CARE_PROVIDER_SITE_OTHER): Payer: Self-pay

## 2017-08-11 ENCOUNTER — Encounter (INDEPENDENT_AMBULATORY_CARE_PROVIDER_SITE_OTHER): Payer: Self-pay | Admitting: Vascular Surgery

## 2017-08-11 VITALS — BP 194/105 | HR 86 | Resp 17 | Ht 70.5 in | Wt 261.0 lb

## 2017-08-11 DIAGNOSIS — N185 Chronic kidney disease, stage 5: Secondary | ICD-10-CM

## 2017-08-11 DIAGNOSIS — I10 Essential (primary) hypertension: Secondary | ICD-10-CM

## 2017-08-11 DIAGNOSIS — E1122 Type 2 diabetes mellitus with diabetic chronic kidney disease: Secondary | ICD-10-CM | POA: Insufficient documentation

## 2017-08-11 DIAGNOSIS — E1159 Type 2 diabetes mellitus with other circulatory complications: Secondary | ICD-10-CM

## 2017-08-11 NOTE — Progress Notes (Signed)
Subjective:    Patient ID: Donald Silva, male    DOB: July 12, 1956, 62 y.o.   MRN: [id number] Chief Complaint  Patient presents with  . New Patient (Initial Visit)    Vein mapping   Presents as a new patient referred by Dr. Smith Mince for evaluation of creation of a permanent dialysis access.  The patient is currently chronic kidney disease stage V and will most likely need dialysis by February 2019.  At this time, the patient denies any uremic symptoms.  The patient underwent a bilateral upper extremity vein mapping which was notable and am able for creation of a left brachiocephalic AV fistula.  The patient was also a candidate for endovascular AV fistula creation however after discussing the procedure he is not interested in moving forward with this type of procedure.  The patient is right-handed.  The patient denies any fever, nausea vomiting.   Review of Systems  Constitutional: Negative.   HENT: Negative.   Eyes: Negative.   Respiratory: Negative.   Cardiovascular: Negative.   Gastrointestinal: Negative.   Endocrine: Negative.   Genitourinary: Negative.   Musculoskeletal: Negative.   Skin: Negative.   Allergic/Immunologic: Negative.   Neurological: Negative.   Hematological: Negative.   Psychiatric/Behavioral: Negative.       Objective:   Physical Exam  Constitutional: He is oriented to person, place, and time. He appears well-developed and well-nourished. No distress.  HENT:  Head: Normocephalic and atraumatic.  Eyes: Conjunctivae are normal. Pupils are equal, round, and reactive to light.  Neck: Normal range of motion.  Cardiovascular: Normal rate, regular rhythm, normal heart sounds and intact distal pulses.  Pulses:      Radial pulses are 2+ on the right side, and 2+ on the left side.  Pulmonary/Chest: Effort normal and breath sounds normal.  Musculoskeletal: Normal range of motion. He exhibits no edema.  Neurological: He is alert and oriented to person, place, and time.   Skin: Skin is warm and dry. He is not diaphoretic.  Psychiatric: He has a normal mood and affect. His behavior is normal. Judgment and thought content normal.  Vitals reviewed.  BP (!) 194/105 (BP Location: Right Arm, Patient Position: Sitting)   Pulse 86   Resp 17   Ht 5' 10.5" (1.791 m)   Wt 261 lb (118.4 kg)   BMI 36.92 kg/m   Past Medical History:  Diagnosis Date  . CKD (chronic kidney disease) stage 5, GFR less than 15 ml/min (HCC)   . Diabetes mellitus without complication [facility])    Social History   Socioeconomic History  . Marital status: Single    Spouse name: Not on file  . Number of children: Not on file  . Years of education: Not on file  . Highest education level: Not on file  Social Needs  . Financial resource strain: Not on file  . Food insecurity - worry: Not on file  . Food insecurity - inability: Not on file  . Transportation needs - medical: Not on file  . Transportation needs - non-medical: Not on file  Occupational History  . Not on file  Tobacco Use  . Smoking status: Never Smoker  . Smokeless tobacco: Never Used  Substance and Sexual Activity  . Alcohol use: No  . Drug use: No  . Sexual activity: Not on file  Other Topics Concern  . Not on file  Social History Narrative  . Not on file   Past Surgical History:  Procedure Laterality Date  .  CHOLECYSTECTOMY     No family history on file.  Allergies  Allergen Reactions  . Penicillins       Assessment & Plan:  Presents as a new patient referred by Dr. Smith Mince for evaluation of creation of a permanent dialysis access.  The patient is currently chronic kidney disease stage V and will most likely need dialysis by February 2019.  At this time, the patient denies any uremic symptoms.  The patient underwent a bilateral upper extremity vein mapping which was notable and am able for creation of a left brachiocephalic AV fistula.  The patient was also a candidate for endovascular AV fistula creation  however after discussing the procedure he is not interested in moving forward with this type of procedure.  The patient is right-handed.  The patient denies any fever, nausea vomiting.  1. Chronic kidney disease, stage V [facility]) - New Patient presents to discuss creation of a permanent dialysis access As per the patient he will need to be on dialysis possibly as soon as February 2019. The patient is right-handed Bilateral upper extremity vein mapping was amenable for creation of a left brachiocephalic AV fistula The patient was also a candidate for endovenous AV fistula creation to the bilateral upper extremity however the patient was not interested in moving forward with this type of procedure at this time. Recommend a left radiocephalic AV fistula creation Procedure, risks and benefits explained to the patient All questions answered The patient wishes to proceed  2. Essential hypertension - Stable Encouraged good control as its slows the progression of atherosclerotic disease  3. Type 2 diabetes mellitus with other circulatory complication, unspecified whether long term insulin use (HCC) - Stable Encouraged good control as its slows the progression of atherosclerotic disease  Current Outpatient Medications on File Prior to Visit  Medication Sig Dispense Refill  . amLODipine (NORVASC) 10 MG tablet Take 10 mg daily by mouth.    [name] aspirin EC 81 MG tablet Take 81 mg daily by mouth.    [name] b complex-vitamin c-folic acid (NEPHRO-VITE) 0.8 MG TABS tablet Take 1 tablet daily by mouth.    . hydrALAZINE (APRESOLINE) 10 MG tablet Take 1 tablet (10 mg total) every 8 (eight) hours by mouth. 90 tablet 0  . Insulin Detemir (LEVEMIR FLEXTOUCH) 100 UNIT/ML Pen Inject into the skin.    [name] insulin glargine (LANTUS) 100 UNIT/ML injection Inject 77 Units at bedtime into the skin.    [name] lisinopril (PRINIVIL,ZESTRIL) 40 MG tablet Take 40 mg by mouth.    . metoprolol succinate (TOPROL-XL) 50 MG 24 hr tablet Take 50  mg daily by mouth.    . sodium bicarbonate 650 MG tablet Take 1,300 mg 2 (two) times daily by mouth.    . Vitamin D, Ergocalciferol, (DRISDOL) 50000 units CAPS capsule Take 50,000 Units every 7 (seven) days by mouth.     No current facility-administered medications on file prior to visit.     There are no Patient Instructions on file for this visit. No Follow-up on file.   Jobina Maita A Caillou Minus, PA-C

## 2017-08-16 ENCOUNTER — Other Ambulatory Visit (INDEPENDENT_AMBULATORY_CARE_PROVIDER_SITE_OTHER): Payer: Self-pay | Admitting: Vascular Surgery

## 2017-08-20 ENCOUNTER — Other Ambulatory Visit: Payer: Self-pay

## 2017-08-20 ENCOUNTER — Encounter
Admission: RE | Admit: 2017-08-20 | Discharge: 2017-08-20 | Disposition: A | Discharge status: Home / Self Care | Payer: BLUE CROSS/BLUE SHIELD | Source: Ambulatory Visit | Attending: Vascular Surgery | Admitting: Vascular Surgery

## 2017-08-20 DIAGNOSIS — I10 Essential (primary) hypertension: Secondary | ICD-10-CM | POA: Diagnosis not present

## 2017-08-20 DIAGNOSIS — I4510 Unspecified right bundle-branch block: Secondary | ICD-10-CM | POA: Diagnosis not present

## 2017-08-20 DIAGNOSIS — Z01818 Encounter for other preprocedural examination: Secondary | ICD-10-CM | POA: Diagnosis present

## 2017-08-20 DIAGNOSIS — I517 Cardiomegaly: Secondary | ICD-10-CM | POA: Insufficient documentation

## 2017-08-20 HISTORY — DX: Gastro-esophageal reflux disease without esophagitis: K21.9

## 2017-08-20 HISTORY — DX: Localized edema: R60.0

## 2017-08-20 HISTORY — DX: Essential (primary) hypertension: I10

## 2017-08-20 HISTORY — DX: Dyspnea, unspecified: R06.00

## 2017-08-20 HISTORY — DX: Acute myocardial infarction, unspecified: I21.9

## 2017-08-20 LAB — BASIC METABOLIC PANEL
Anion gap: 10 (ref 5–15)
BUN: 68 mg/dL — AB (ref 6–20)
CALCIUM: 8.2 mg/dL — AB (ref 8.9–10.3)
CO2: 21 mmol/L — ABNORMAL LOW (ref 22–32)
CREATININE: 5.95 mg/dL — AB (ref 0.61–1.24)
Chloride: 110 mmol/L (ref 101–111)
GFR calc Af Amer: 10 mL/min — ABNORMAL LOW (ref >60)
GFR, EST NON AFRICAN AMERICAN: 9 mL/min — AB (ref >60)
GLUCOSE: 88 mg/dL (ref 65–99)
Potassium: 4.1 mmol/L (ref 3.5–5.1)
Sodium: 141 mmol/L (ref 135–145)

## 2017-08-20 LAB — CBC
HCT: 34.4 % — ABNORMAL LOW (ref 40.0–52.0)
Hemoglobin: 11.5 g/dL — ABNORMAL LOW (ref 13.0–18.0)
MCH: 29 pg (ref 26.0–34.0)
MCHC: 33.4 g/dL (ref 32.0–36.0)
MCV: 86.9 fL (ref 80.0–100.0)
PLATELETS: 206 10*3/uL (ref 150–440)
RBC: 3.96 MIL/uL — ABNORMAL LOW (ref 4.40–5.90)
RDW: 15 % — ABNORMAL HIGH (ref 11.5–14.5)
WBC: 8.2 10*3/uL (ref 3.8–10.6)

## 2017-08-20 LAB — TYPE AND SCREEN
ABO/RH(D): B POS
ANTIBODY SCREEN: NEGATIVE

## 2017-08-20 NOTE — Patient Instructions (Signed)
Your procedure is scheduled on: 08/27/17 Fri Report to Same Day Surgery 2nd floor medical mall [facility] Entrance-take elevator on left to 2nd floor.  Check in with surgery information desk.) To find out your arrival time please call [phone] between 1PM - 3PM on 08/26/17 Thur  Remember: Instructions that are not followed completely may result in serious medical risk, up to and including death, or upon the discretion of your surgeon and anesthesiologist your surgery may need to be rescheduled.    _x___ 1. Do not eat food after midnight the night before your procedure. You may drink clear liquids up to 2 hours before you are scheduled to arrive at the hospital for your procedure.  Do not drink clear liquids within 2 hours of your scheduled arrival to the hospital.  Clear liquids include  --Water or Apple juice without pulp  --Clear carbohydrate beverage such as ClearFast or Gatorade  --Black Coffee or Clear Tea (No milk, no creamers, do not add anything to                  the coffee or Tea Type 1 and type 2 diabetics should only drink water.  No gum chewing or hard candies.     __x__ 2. No Alcohol for 24 hours before or after surgery.   __x__3. No Smoking for 24 prior to surgery.   ____  4. Bring all medications with you on the day of surgery if instructed.    __x__ 5. Notify your doctor if there is any change in your medical condition     (cold, fever, infections).     Do not wear jewelry, make-up, hairpins, clips or nail polish.  Do not wear lotions, powders, or perfumes. You may wear deodorant.  Do not shave 48 hours prior to surgery. Men may shave face and neck.  Do not bring valuables to the hospital.    [facility] is not responsible for any belongings or valuables.               Contacts, dentures or bridgework may not be worn into surgery.  Leave your suitcase in the car. After surgery it may be brought to your room.  For patients admitted to the hospital, discharge  time is determined by your                       treatment team.   Patients discharged the day of surgery will not be allowed to drive home.  You will need someone to drive you home and stay with you the night of your procedure.    Please read over the following fact sheets that you were given:   [facility] Preparing for Surgery and or MRSA Information   _x___ Take anti-hypertensive listed below, cardiac, seizure, asthma,     anti-reflux and psychiatric medicines. These include:  1. hydrALAZINE (APRESOLINE) 10 MG tablet  2.  3.  4.  5.  6.  ____Fleets enema or Magnesium Citrate as directed.   _x___ Use CHG Soap or sage wipes as directed on instruction sheet   ____ Use inhalers on the day of surgery and bring to hospital day of surgery  ____ Stop Metformin and Janumet 2 days prior to surgery.    _x___ Take 1/2 of usual insulin dose the night before surgery and none on the morning     surgery.   _x___ Follow recommendations from Cardiologist, Pulmonologist or PCP regarding  stopping Aspirin, Coumadin, Plavix ,Eliquis, Effient, or Pradaxa, and Pletal.  X____Stop Anti-inflammatories such as Advil, Aleve, Ibuprofen, Motrin, Naproxen, Naprosyn, Goodies powders or aspirin products. OK to take Tylenol and                          Celebrex.   _x___ Stop supplements until after surgery.  But may continue Vitamin D, Vitamin B,       and multivitamin.   ____ Bring C-Pap to the hospital.

## 2017-08-23 NOTE — Pre-Procedure Instructions (Signed)
EKG COMPARED WITH OLD AND LAFB NOTED

## 2017-08-27 ENCOUNTER — Ambulatory Visit
Admission: RE | Admit: 2017-08-27 | Discharge: 2017-08-27 | Disposition: A | Discharge status: Home / Self Care | Payer: BLUE CROSS/BLUE SHIELD | Source: Ambulatory Visit | Attending: Vascular Surgery | Admitting: Vascular Surgery

## 2017-08-27 ENCOUNTER — Encounter
Admission: RE | Disposition: A | Discharge status: Home / Self Care | Payer: Self-pay | Source: Ambulatory Visit | Attending: Vascular Surgery

## 2017-08-27 ENCOUNTER — Ambulatory Visit: Payer: BLUE CROSS/BLUE SHIELD | Admitting: Anesthesiology

## 2017-08-27 ENCOUNTER — Encounter: Payer: Self-pay | Admitting: Anesthesiology

## 2017-08-27 DIAGNOSIS — N186 End stage renal disease: Secondary | ICD-10-CM | POA: Insufficient documentation

## 2017-08-27 DIAGNOSIS — Z794 Long term (current) use of insulin: Secondary | ICD-10-CM | POA: Insufficient documentation

## 2017-08-27 DIAGNOSIS — Z7982 Long term (current) use of aspirin: Secondary | ICD-10-CM | POA: Diagnosis not present

## 2017-08-27 DIAGNOSIS — E1122 Type 2 diabetes mellitus with diabetic chronic kidney disease: Secondary | ICD-10-CM | POA: Insufficient documentation

## 2017-08-27 DIAGNOSIS — Z79899 Other long term (current) drug therapy: Secondary | ICD-10-CM | POA: Insufficient documentation

## 2017-08-27 DIAGNOSIS — E1151 Type 2 diabetes mellitus with diabetic peripheral angiopathy without gangrene: Secondary | ICD-10-CM | POA: Insufficient documentation

## 2017-08-27 DIAGNOSIS — I120 Hypertensive chronic kidney disease with stage 5 chronic kidney disease or end stage renal disease: Secondary | ICD-10-CM | POA: Diagnosis not present

## 2017-08-27 HISTORY — PX: AV FISTULA PLACEMENT: SHX1204

## 2017-08-27 LAB — POCT I-STAT 4, (NA,K, GLUC, HGB,HCT)
Glucose, Bld: 110 mg/dL — ABNORMAL HIGH (ref 65–99)
HCT: 31 % — ABNORMAL LOW (ref 39.0–52.0)
Hemoglobin: 10.5 g/dL — ABNORMAL LOW (ref 13.0–17.0)
POTASSIUM: 4.9 mmol/L (ref 3.5–5.1)
SODIUM: 145 mmol/L (ref 135–145)

## 2017-08-27 LAB — ABO/RH: ABO/RH(D): B POS

## 2017-08-27 LAB — GLUCOSE, CAPILLARY
GLUCOSE-CAPILLARY: 103 mg/dL — AB (ref 65–99)
Glucose-Capillary: 113 mg/dL — ABNORMAL HIGH (ref 65–99)

## 2017-08-27 SURGERY — ARTERIOVENOUS (AV) FISTULA CREATION
Anesthesia: General | Laterality: Left | Wound class: Clean

## 2017-08-27 MED ORDER — SODIUM CHLORIDE 0.9 % IV SOLN
INTRAVENOUS | Status: DC
  Administered 2017-08-27: 08:00:00 via INTRAVENOUS

## 2017-08-27 MED ORDER — GLYCOPYRROLATE 0.2 MG/ML IJ SOLN
INTRAMUSCULAR | Status: DC | PRN
  Administered 2017-08-27: 0.2 mg via INTRAVENOUS

## 2017-08-27 MED ORDER — PROPOFOL 10 MG/ML IV BOLUS
INTRAVENOUS | Status: AC
  Filled 2017-08-27: qty 20

## 2017-08-27 MED ORDER — CLINDAMYCIN PHOSPHATE 300 MG/50ML IV SOLN
300.0000 mg | Freq: Once | INTRAVENOUS | Status: AC
  Administered 2017-08-27: 300 mg via INTRAVENOUS

## 2017-08-27 MED ORDER — FAMOTIDINE 20 MG PO TABS
20.0000 mg | ORAL_TABLET | Freq: Once | ORAL | Status: AC
  Administered 2017-08-27: 20 mg via ORAL

## 2017-08-27 MED ORDER — FENTANYL CITRATE (PF) 100 MCG/2ML IJ SOLN: 25.0000 ug | INTRAMUSCULAR | Status: DC | PRN

## 2017-08-27 MED ORDER — FAMOTIDINE 20 MG PO TABS: 40.0000 mg | ORAL_TABLET | ORAL | Status: DC | PRN

## 2017-08-27 MED ORDER — ONDANSETRON HCL 4 MG/2ML IJ SOLN
INTRAMUSCULAR | Status: AC
  Filled 2017-08-27: qty 2

## 2017-08-27 MED ORDER — METHYLPREDNISOLONE SODIUM SUCC 125 MG IJ SOLR: 125.0000 mg | INTRAMUSCULAR | Status: DC | PRN

## 2017-08-27 MED ORDER — DEXAMETHASONE SODIUM PHOSPHATE 10 MG/ML IJ SOLN
INTRAMUSCULAR | Status: AC
  Filled 2017-08-27: qty 1

## 2017-08-27 MED ORDER — CLINDAMYCIN PHOSPHATE 300 MG/50ML IV SOLN
INTRAVENOUS | Status: AC
  Filled 2017-08-27: qty 50

## 2017-08-27 MED ORDER — HEPARIN SODIUM (PORCINE) 5000 UNIT/ML IJ SOLN
INTRAMUSCULAR | Status: AC
Start: 2017-08-27 — End: ?
  Filled 2017-08-27: qty 1

## 2017-08-27 MED ORDER — VASOPRESSIN 20 UNIT/ML IV SOLN
INTRAVENOUS | Status: DC | PRN
  Administered 2017-08-27: 1 [IU] via INTRAVENOUS

## 2017-08-27 MED ORDER — SODIUM CHLORIDE 0.9 % IV SOLN: INTRAVENOUS | Status: DC

## 2017-08-27 MED ORDER — FENTANYL CITRATE (PF) 100 MCG/2ML IJ SOLN
INTRAMUSCULAR | Status: AC
  Filled 2017-08-27: qty 2

## 2017-08-27 MED ORDER — DEXAMETHASONE SODIUM PHOSPHATE 10 MG/ML IJ SOLN
INTRAMUSCULAR | Status: DC | PRN
  Administered 2017-08-27: 10 mg via INTRAVENOUS

## 2017-08-27 MED ORDER — SODIUM CHLORIDE 0.9 % IV SOLN
INTRAVENOUS | Status: DC | PRN
  Administered 2017-08-27: 50 ug/min via INTRAVENOUS

## 2017-08-27 MED ORDER — BUPIVACAINE HCL (PF) 0.5 % IJ SOLN
INTRAMUSCULAR | Status: AC
Start: 2017-08-27 — End: ?
  Filled 2017-08-27: qty 30

## 2017-08-27 MED ORDER — PAPAVERINE HCL 30 MG/ML IJ SOLN
INTRAMUSCULAR | Status: AC
Start: 2017-08-27 — End: ?
  Filled 2017-08-27: qty 2

## 2017-08-27 MED ORDER — SEVOFLURANE IN SOLN
RESPIRATORY_TRACT | Status: AC
  Filled 2017-08-27: qty 250

## 2017-08-27 MED ORDER — FENTANYL CITRATE (PF) 100 MCG/2ML IJ SOLN
INTRAMUSCULAR | Status: DC | PRN
  Administered 2017-08-27: 50 ug via INTRAVENOUS

## 2017-08-27 MED ORDER — GLYCOPYRROLATE 0.2 MG/ML IJ SOLN
INTRAMUSCULAR | Status: AC
  Filled 2017-08-27: qty 1

## 2017-08-27 MED ORDER — HYDROCODONE-ACETAMINOPHEN 5-325 MG PO TABS: 1.0000 | ORAL_TABLET | Freq: Four times a day (QID) | ORAL | 0 refills | Status: DC | PRN

## 2017-08-27 MED ORDER — ONDANSETRON HCL 4 MG/2ML IJ SOLN: 4.0000 mg | Freq: Once | INTRAMUSCULAR | Status: DC | PRN

## 2017-08-27 MED ORDER — METHYLPREDNISOLONE SODIUM SUCC 125 MG IJ SOLR
INTRAMUSCULAR | Status: AC
  Filled 2017-08-27: qty 4

## 2017-08-27 MED ORDER — LIDOCAINE HCL (CARDIAC) 20 MG/ML IV SOLN
INTRAVENOUS | Status: DC | PRN
  Administered 2017-08-27: 100 mg via INTRAVENOUS

## 2017-08-27 MED ORDER — FAMOTIDINE 20 MG PO TABS
ORAL_TABLET | ORAL | Status: AC
  Administered 2017-08-27: 20 mg via ORAL
  Filled 2017-08-27: qty 1

## 2017-08-27 MED ORDER — PROPOFOL 10 MG/ML IV BOLUS
INTRAVENOUS | Status: DC | PRN
  Administered 2017-08-27: 20 mg via INTRAVENOUS
  Administered 2017-08-27: 30 mg via INTRAVENOUS
  Administered 2017-08-27: 150 mg via INTRAVENOUS

## 2017-08-27 MED ORDER — ONDANSETRON HCL 4 MG/2ML IJ SOLN
INTRAMUSCULAR | Status: DC | PRN
  Administered 2017-08-27: 4 mg via INTRAVENOUS

## 2017-08-27 MED ORDER — LIDOCAINE HCL (PF) 2 % IJ SOLN
INTRAMUSCULAR | Status: AC
  Filled 2017-08-27: qty 10

## 2017-08-27 MED ORDER — PHENYLEPHRINE HCL 10 MG/ML IJ SOLN
INTRAMUSCULAR | Status: AC
Start: 2017-08-27 — End: ?
  Filled 2017-08-27: qty 1

## 2017-08-27 SURGICAL SUPPLY — 58 items
APPLIER CLIP 11 MED OPEN (CLIP)
APPLIER CLIP 9.375 SM OPEN (CLIP)
BAG DECANTER FOR FLEXI CONT (MISCELLANEOUS) ×3 IMPLANT
BLADE SURG SZ11 CARB STEEL (BLADE) ×3 IMPLANT
BOOT SUTURE AID YELLOW STND (SUTURE) ×3 IMPLANT
BRUSH SCRUB EZ  4% CHG (MISCELLANEOUS) ×2
BRUSH SCRUB EZ 4% CHG (MISCELLANEOUS) ×1 IMPLANT
CANISTER SUCT 1200ML W/VALVE (MISCELLANEOUS) ×3 IMPLANT
CHLORAPREP W/TINT 26ML (MISCELLANEOUS) ×3 IMPLANT
CLIP APPLIE 11 MED OPEN (CLIP) IMPLANT
CLIP APPLIE 9.375 SM OPEN (CLIP) IMPLANT
CLIP SPRNG 6MM S-JAW DBL (CLIP) ×3
DERMABOND ADVANCED (GAUZE/BANDAGES/DRESSINGS) ×2
DERMABOND ADVANCED .7 DNX12 (GAUZE/BANDAGES/DRESSINGS) ×1 IMPLANT
DRESSING SURGICEL FIBRLLR 1X2 (HEMOSTASIS) IMPLANT
DRSG SURGICEL FIBRILLAR 1X2 (HEMOSTASIS)
ELECT CAUTERY BLADE 6.4 (BLADE) ×3 IMPLANT
ELECT REM PT RETURN 9FT ADLT (ELECTROSURGICAL) ×3
ELECTRODE REM PT RTRN 9FT ADLT (ELECTROSURGICAL) ×1 IMPLANT
GEL ULTRASOUND 20GR AQUASONIC (MISCELLANEOUS) IMPLANT
GLOVE BIO SURGEON STRL SZ7 (GLOVE) ×3 IMPLANT
GLOVE INDICATOR 7.5 STRL GRN (GLOVE) ×3 IMPLANT
GLOVE SURG SYN 8.0 (GLOVE) ×3 IMPLANT
GOWN STRL REUS W/ TWL LRG LVL3 (GOWN DISPOSABLE) ×2 IMPLANT
GOWN STRL REUS W/ TWL XL LVL3 (GOWN DISPOSABLE) ×1 IMPLANT
GOWN STRL REUS W/TWL LRG LVL3 (GOWN DISPOSABLE) ×4
GOWN STRL REUS W/TWL XL LVL3 (GOWN DISPOSABLE) ×2
IV NS 500ML (IV SOLUTION) ×2
IV NS 500ML BAXH (IV SOLUTION) ×1 IMPLANT
KIT RM TURNOVER STRD PROC AR (KITS) ×3 IMPLANT
LABEL OR SOLS (LABEL) ×3 IMPLANT
LOOP RED MAXI  1X406MM (MISCELLANEOUS) ×2
LOOP VESSEL MAXI 1X406 RED (MISCELLANEOUS) ×1 IMPLANT
LOOP VESSEL MINI 0.8X406 BLUE (MISCELLANEOUS) ×2 IMPLANT
LOOPS BLUE MINI 0.8X406MM (MISCELLANEOUS) ×4
NEEDLE FILTER BLUNT 18X 1/2SAF (NEEDLE) ×2
NEEDLE FILTER BLUNT 18X1 1/2 (NEEDLE) ×1 IMPLANT
NEEDLE HYPO 30X.5 LL (NEEDLE) IMPLANT
NS IRRIG 500ML POUR BTL (IV SOLUTION) ×3 IMPLANT
PACK EXTREMITY ARMC (MISCELLANEOUS) ×3 IMPLANT
PAD PREP 24X41 OB/GYN DISP (PERSONAL CARE ITEMS) IMPLANT
PUNCH SURGICAL ROTATE 2.7MM (MISCELLANEOUS) IMPLANT
SOLUTION CELL SAVER (CLIP) ×1 IMPLANT
STOCKINETTE STRL 4IN 9604848 (GAUZE/BANDAGES/DRESSINGS) ×3 IMPLANT
SUT MNCRL+ 5-0 UNDYED PC-3 (SUTURE) ×1 IMPLANT
SUT MONOCRYL 5-0 (SUTURE) ×2
SUT PROLENE 6 0 BV (SUTURE) ×12 IMPLANT
SUT SILK 2 0 (SUTURE) ×2
SUT SILK 2-0 18XBRD TIE 12 (SUTURE) ×1 IMPLANT
SUT SILK 3 0 (SUTURE) ×2
SUT SILK 3-0 18XBRD TIE 12 (SUTURE) ×1 IMPLANT
SUT SILK 4 0 (SUTURE) ×2
SUT SILK 4-0 18XBRD TIE 12 (SUTURE) ×1 IMPLANT
SUT VIC AB 3-0 SH 27 (SUTURE) ×2
SUT VIC AB 3-0 SH 27X BRD (SUTURE) ×1 IMPLANT
SYR 20CC LL (SYRINGE) ×3 IMPLANT
SYR 3ML LL SCALE MARK (SYRINGE) ×3 IMPLANT
TOWEL OR 17X26 4PK STRL BLUE (TOWEL DISPOSABLE) IMPLANT

## 2017-08-27 NOTE — Discharge Instructions (Signed)
  AMBULATORY SURGERY  DISCHARGE INSTRUCTIONS   1) The drugs that you were given will stay in your system until tomorrow so for the next 24 hours you should not:  A) Drive an automobile B) Make any legal decisions C) Drink any alcoholic beverage   2) You may resume regular meals tomorrow.  Today it is better to start with liquids and gradually work up to solid foods.  You may eat anything you prefer, but it is better to start with liquids, then soup and crackers, and gradually work up to solid foods.   3) Please notify your doctor immediately if you have any unusual bleeding, trouble breathing, redness and pain at the surgery site, drainage, fever, or pain not relieved by medication.    4) Additional Instructions: TAKE A STOOL SOFTENER TWICE A DAY WHILE TAKING NARCOTIC PAIN MEDICINE TO PREVENT CONSTIPATION   Please contact your physician with any problems or Same Day Surgery at [phone], Monday through Friday 6 am to 4 pm, or [place] [place] Main number at [phone].   

## 2017-08-27 NOTE — Progress Notes (Signed)
Blood pressure 158/97  Dr Marcello Moores aware   No new orders at present

## 2017-08-27 NOTE — Anesthesia Procedure Notes (Addendum)
Procedure Name: LMA Insertion Date/Time: 08/27/2017 7:50 AM Performed by: Nelda Marseille, CRNA Pre-anesthesia Checklist: Patient identified, Patient being monitored, Timeout performed, Emergency Drugs available and Suction available Patient Re-evaluated:Patient Re-evaluated prior to induction Oxygen Delivery Method: Circle system utilized Preoxygenation: Pre-oxygenation with 100% oxygen Induction Type: IV induction Ventilation: Mask ventilation without difficulty LMA: LMA inserted LMA Size: 4.5 Tube type: Oral Number of attempts: 1 Placement Confirmation: positive ETCO2 and breath sounds checked- equal and bilateral Tube secured with: Tape Dental Injury: Teeth and Oropharynx as per pre-operative assessment

## 2017-08-27 NOTE — Anesthesia Preprocedure Evaluation (Addendum)
Anesthesia Evaluation  Patient identified by MRN, date of birth, ID band Patient awake    Reviewed: Allergy & Precautions, NPO status , Patient's Chart, lab work & pertinent test results, reviewed documented beta blocker date and time   Airway Mallampati: III  TM Distance: >3 FB     Dental  (+) Chipped   Pulmonary shortness of breath,           Cardiovascular hypertension, Pt. on medications + Peripheral Vascular Disease       Neuro/Psych    GI/Hepatic GERD  Controlled,  Endo/Other  diabetes, Type 2  Renal/GU ESRFRenal disease     Musculoskeletal   Abdominal   Peds  Hematology   Anesthesia Other Findings Denies MI.  Reproductive/Obstetrics                            Anesthesia Physical Anesthesia Plan  ASA: III  Anesthesia Plan: General   Post-op Pain Management:    Induction: Intravenous  PONV Risk Score and Plan:   Airway Management Planned: LMA  Additional Equipment:   Intra-op Plan:   Post-operative Plan:   Informed Consent: I have reviewed the patients History and Physical, chart, labs and discussed the procedure including the risks, benefits and alternatives for the proposed anesthesia with the patient or authorized representative who has indicated his/her understanding and acceptance.     Plan Discussed with: CRNA  Anesthesia Plan Comments:         Anesthesia Quick Evaluation

## 2017-08-27 NOTE — Transfer of Care (Signed)
Immediate Anesthesia Transfer of Care Note  Patient: Donald Silva  Procedure(s) Performed: ARTERIOVENOUS (AV) FISTULA CREATION ( BRACHIAL CEPHALIC ) (Left )  Patient Location: PACU  Anesthesia Type:General  Level of Consciousness: sedated  Airway & Oxygen Therapy: Patient Spontanous Breathing  Post-op Assessment: Report given to RN  Post vital signs: Reviewed and stable  Last Vitals:  Vitals:   08/27/17 0645  BP: (!) 185/101  Pulse: 90  Resp: 17  Temp: 36.6 C  SpO2: 100%    Last Pain:  Vitals:   08/27/17 0645  TempSrc: Oral         Complications: No apparent anesthesia complications

## 2017-08-27 NOTE — Progress Notes (Signed)
Pt does not have bruit or thrill   Dr schnier aware

## 2017-08-27 NOTE — Op Note (Signed)
     OPERATIVE NOTE   PROCEDURE: left brachial cephalic arteriovenous fistula placement  PRE-OPERATIVE DIAGNOSIS: End Stage Renal Disease  POST-OPERATIVE DIAGNOSIS: End Stage Renal Disease  SURGEON: Hortencia Pilar  ASSISTANT(S): Ms. Hezzie Bump  ANESTHESIA: general  ESTIMATED BLOOD LOSS: <50 cc  FINDING(S): 4 mm cephalic vein  SPECIMEN(S):  none  INDICATIONS:   Donald Silva is a 62 y.o. male who presents with end stage renal disease.  The patient is scheduled for left brachiocephalic arteriovenous fistula placement.  The patient is aware the risks include but are not limited to: bleeding, infection, steal syndrome, nerve damage, ischemic monomelic neuropathy, failure to mature, and need for additional procedures.  The patient is aware of the risks of the procedure and elects to proceed forward.  DESCRIPTION: After full informed written consent was obtained from the patient, the patient was brought back to the operating room and placed supine upon the operating table.  Prior to induction, the patient received IV antibiotics.   After obtaining adequate anesthesia, the patient was then prepped and draped in the standard fashion for a left arm access procedure.    A curvilinear incision was then created midway between the radial impulse and the cephalic vein. The cephalic vein was then identified and dissected circumferentially. It was marked with a surgical marker.    Attention was then turned to the brachial artery which was exposed through the same incision and looped proximally and distally. Side branches were controlled with 4-0 silk ties.  The distal segment of the vein was ligated with a  2-0 silk, and the vein was transected.  The proximal segment was interrogated with serial dilators.  The vein accepted up to a 4 mm mm dilator without any difficulty. Heparinized saline was infused into the vein and clamped it with a small bulldog.  At this point, I reset my exposure of  the brachial artery and controlled the artery with vessel loops proximally and distally.  An arteriotomy was then made with a #11 blade, and extended with a Potts scissor.  Heparinized saline was injected proximal and distal into the radial artery.  The vein was then approximated to the artery while the artery was in its native bed and subsequently the vein was beveled using Potts scissors. The vein was then sewn to the artery in an end-to-side configuration with a running stitch of 6-0 Prolene.  Prior to completing this anastomosis Flushing maneuvers were performed and the artery was allowed to forward and back bleed.  There was no evidence of clot from any vessels.  I completed the anastomosis in the usual fashion and then released all vessel loops and clamps.    There was good  thrill in the venous outflow, and there was 1+ palpable radial pulse.  At this point, I irrigated out the surgical wound.  There was no further active bleeding.  The subcutaneous tissue was reapproximated with a running stitch of 3-0 Vicryl.  The skin was then reapproximated with a running subcuticular stitch of 4-0 Vicryl.  The skin was then cleaned, dried, and reinforced with Dermabond.    The patient tolerated this procedure well.   COMPLICATIONS: None  CONDITION: [facility]  Office: [phone]   08/27/2017, 9:13 AM

## 2017-08-27 NOTE — OR Nursing (Signed)
IV removed catheter intact site clear.

## 2017-08-27 NOTE — Anesthesia Post-op Follow-up Note (Signed)
Anesthesia QCDR form completed.        

## 2017-08-27 NOTE — H&P (Signed)
[facility] History & Physical Update  The patient was interviewed and re-examined.  The patient's previous History and Physical has been reviewed and is unchanged.  There is no change in the plan of care. We plan to proceed with the scheduled procedure.  Hortencia Pilar, MD  08/27/2017, 7:23 AM

## 2017-08-27 NOTE — OR Nursing (Signed)
IV with NS at [facility] back of rt forearm. CDI

## 2017-08-27 NOTE — Anesthesia Postprocedure Evaluation (Signed)
Anesthesia Post Note  Patient: Donald Silva  Procedure(s) Performed: ARTERIOVENOUS (AV) FISTULA CREATION ( BRACHIAL CEPHALIC ) (Left )  Patient location during evaluation: PACU Anesthesia Type: General Level of consciousness: awake and alert Pain management: pain level controlled Vital Signs Assessment: post-procedure vital signs reviewed and stable Respiratory status: spontaneous breathing, nonlabored ventilation, respiratory function stable and patient connected to nasal cannula oxygen Cardiovascular status: blood pressure returned to baseline and stable Postop Assessment: no apparent nausea or vomiting Anesthetic complications: no     Last Vitals:  Vitals:   08/27/17 1033 08/27/17 1110  BP: (!) 147/91 (!) 169/89  Pulse: 84 87  Resp: 18 (!) 116  Temp:    SpO2: 98% 100%    Last Pain:  Vitals:   08/27/17 0645  TempSrc: Oral                 Hawthorne Day S

## 2017-09-01 ENCOUNTER — Encounter (INDEPENDENT_AMBULATORY_CARE_PROVIDER_SITE_OTHER): Payer: Self-pay | Admitting: Vascular Surgery

## 2017-09-01 DIAGNOSIS — Z0289 Encounter for other administrative examinations: Secondary | ICD-10-CM

## 2017-09-08 ENCOUNTER — Other Ambulatory Visit (INDEPENDENT_AMBULATORY_CARE_PROVIDER_SITE_OTHER): Payer: Self-pay | Admitting: Vascular Surgery

## 2017-09-08 DIAGNOSIS — N185 Chronic kidney disease, stage 5: Secondary | ICD-10-CM

## 2017-09-13 ENCOUNTER — Ambulatory Visit (INDEPENDENT_AMBULATORY_CARE_PROVIDER_SITE_OTHER): Payer: BLUE CROSS/BLUE SHIELD | Admitting: Vascular Surgery

## 2017-09-13 ENCOUNTER — Encounter (INDEPENDENT_AMBULATORY_CARE_PROVIDER_SITE_OTHER): Payer: Self-pay | Admitting: Vascular Surgery

## 2017-09-13 ENCOUNTER — Ambulatory Visit (INDEPENDENT_AMBULATORY_CARE_PROVIDER_SITE_OTHER): Payer: BLUE CROSS/BLUE SHIELD

## 2017-09-13 VITALS — BP 155/89 | HR 88 | Resp 16 | Wt 263.0 lb

## 2017-09-13 DIAGNOSIS — N185 Chronic kidney disease, stage 5: Secondary | ICD-10-CM

## 2017-09-13 NOTE — Progress Notes (Signed)
    Patient ID: Donald Silva, male   DOB: [birth year], 62 y.o.   MRN: [id number]  Chief Complaint  Patient presents with  . Follow-up    2wk [facility] HDA    HPI Donald Silva is a 62 y.o. male.  He is s/p left brachial cephalic arteriovenous fistula placement creation on 08/27/2017.  He notes moderate pain and swelling of his left arm  No fever no chills     Past Medical History:  Diagnosis Date  . CKD (chronic kidney disease) stage 5, GFR less than 15 ml/min (HCC)   . Diabetes mellitus without complication ([place])   . Dyspnea   . GERD (gastroesophageal reflux disease)   . Hypertension   . Lower extremity edema   . Myocardial infarction [facility])     Past Surgical History:  Procedure Laterality Date  . AV FISTULA PLACEMENT Left 08/27/2017   Procedure: ARTERIOVENOUS (AV) FISTULA CREATION ( BRACHIAL CEPHALIC );  Surgeon: Katha Cabal, MD;  Location: [facility] ORS;  Service: Vascular;  Laterality: Left;  . CHOLECYSTECTOMY    . TOE SURGERY Right       Allergies  Allergen Reactions  . Penicillins Anaphylaxis    Was told it could kill him, pt unsure of reaction  Has patient had a PCN reaction causing immediate rash, facial/tongue/throat swelling, SOB or lightheadedness with hypotension: No Has patient had a PCN reaction causing severe rash involving mucus membranes or skin necrosis: No Has patient had a PCN reaction that required hospitalization: No Has patient had a PCN reaction occurring within the last 10 years: No If all of the above answers are "NO", then may proceed with Cephalosporin use.     Current Outpatient Medications  Medication Sig Dispense Refill  . aspirin EC 81 MG tablet Take 81 mg daily by mouth.    . hydrALAZINE (APRESOLINE) 10 MG tablet Take 1 tablet (10 mg total) every 8 (eight) hours by mouth. 90 tablet 0  . HYDROcodone-acetaminophen (NORCO) 5-325 MG tablet Take 1-2 tablets by mouth every 6 (six) hours as needed. 40 tablet 0  . Insulin Detemir (LEVEMIR  FLEXTOUCH) 100 UNIT/ML Pen Inject 77 Units into the skin every evening.     . sodium bicarbonate 650 MG tablet Take 1,300 mg by mouth 3 (three) times daily.     . Vitamin D, Ergocalciferol, (DRISDOL) 50000 units CAPS capsule Take 50,000 Units by mouth every 7 (seven) days. Monday     No current facility-administered medications for this visit.         Physical Exam BP (!) 155/89 (BP Location: Right Arm)   Pulse 88   Resp 16   Wt 263 lb (119.3 kg)   BMI 36.68 kg/m  Gen:  WD/WN, NAD Skin: incision C/D/I, good thrill good bruit left fistula     Assessment/Plan:  1. Chronic kidney disease, stage V ([place]) Recommend:  The patient is doing well and currently has adequate dialysis access.  The patient's dialysis center is not reporting any access issues. Flow pattern is stable.  The patient should have a duplex ultrasound of the dialysis access in 4 weeks  The patient will follow-up with me in the office after each ultrasound         Hortencia Pilar 09/13/2017, 8:55 AM   This note was created with Dragon medical transcription system.  Any errors from dictation are unintentional.

## 2017-09-14 ENCOUNTER — Encounter (INDEPENDENT_AMBULATORY_CARE_PROVIDER_SITE_OTHER): Payer: BLUE CROSS/BLUE SHIELD

## 2017-09-14 ENCOUNTER — Encounter (INDEPENDENT_AMBULATORY_CARE_PROVIDER_SITE_OTHER): Payer: BLUE CROSS/BLUE SHIELD | Admitting: Vascular Surgery

## 2017-09-29 ENCOUNTER — Telehealth (INDEPENDENT_AMBULATORY_CARE_PROVIDER_SITE_OTHER): Payer: Self-pay

## 2017-09-29 NOTE — Telephone Encounter (Signed)
Patient called stating that he had some pus from his left arm that has has been occasional.Patient had a procedure on 08/27/17.I spoke KS and she advise for the pt to call  if the puss continues,redness,tenderness,fever,nausea occurs and we will move up his appointment.His appointment is on 10/11/17

## 2017-10-01 DIAGNOSIS — Z0289 Encounter for other administrative examinations: Secondary | ICD-10-CM

## 2017-10-11 ENCOUNTER — Encounter (INDEPENDENT_AMBULATORY_CARE_PROVIDER_SITE_OTHER): Payer: Self-pay | Admitting: Vascular Surgery

## 2017-10-11 ENCOUNTER — Ambulatory Visit (INDEPENDENT_AMBULATORY_CARE_PROVIDER_SITE_OTHER): Payer: BLUE CROSS/BLUE SHIELD | Admitting: Vascular Surgery

## 2017-10-11 VITALS — BP 151/84 | HR 85 | Resp 16 | Ht 71.0 in | Wt 272.0 lb

## 2017-10-11 DIAGNOSIS — N185 Chronic kidney disease, stage 5: Secondary | ICD-10-CM

## 2017-10-11 NOTE — Progress Notes (Signed)
Patient ID: Donald Silva, male   DOB: 03/21/1956, 62 y.o.   MRN: [id number]  Chief Complaint  Patient presents with  . Follow-up    4 week no studies    HPI Donald Silva is a 62 y.o. male.    PROCEDURE 08/27/2017: left brachial cephalic arteriovenous fistula placement  He feels his left arm is weak since surgery.  He specifically denies pain or difficulty with grasping things he feels this is just an overall decrease in his strength.  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  He is very concerned that he will physically not be able to help perform his required duties.     Past Medical History:  Diagnosis Date  . CKD (chronic kidney disease) stage 5, GFR less than 15 ml/min (HCC)   . Diabetes mellitus without complication ([place])   . Dyspnea   . GERD (gastroesophageal reflux disease)   . Hypertension   . Lower extremity edema   . Myocardial infarction [facility])     Past Surgical History:  Procedure Laterality Date  . AV FISTULA PLACEMENT Left 08/27/2017   Procedure: ARTERIOVENOUS (AV) FISTULA CREATION ( BRACHIAL CEPHALIC );  Surgeon: Katha Cabal, MD;  Location: [facility] ORS;  Service: Vascular;  Laterality: Left;  . CHOLECYSTECTOMY    . TOE SURGERY Right       Allergies  Allergen Reactions  . Penicillins Anaphylaxis    Was told it could kill him, pt unsure of reaction  Has patient had a PCN reaction causing immediate rash, facial/tongue/throat swelling, SOB or lightheadedness with hypotension: No Has patient had a PCN reaction causing severe rash involving mucus membranes or skin necrosis: No Has patient had a PCN reaction that required hospitalization: No Has patient had a PCN reaction occurring within the last 10 years: No If all of the above answers are "NO", then may proceed with Cephalosporin use.     Current Outpatient Medications  Medication Sig Dispense Refill  . amLODipine (NORVASC) 5 MG tablet   2  . aspirin EC 81 MG tablet Take 81 mg daily by mouth.    . hydrALAZINE (APRESOLINE) 10 MG tablet Take 1 tablet (10 mg total) every 8 (eight) hours by mouth. 90 tablet 0  . HYDROcodone-acetaminophen (NORCO) 5-325 MG tablet Take 1-2 tablets by mouth every 6 (six) hours as needed. 40 tablet 0  . Insulin Detemir (LEVEMIR FLEXTOUCH) 100 UNIT/ML Pen Inject 77 Units into the skin every evening.     . metoprolol succinate (TOPROL-XL) 25 MG 24 hr tablet Take 25 mg by mouth.    . sodium bicarbonate 650 MG tablet Take 1,300 mg by mouth 3 (three) times daily.     . Vitamin D, Ergocalciferol, (DRISDOL) 50000 units CAPS capsule Take 50,000 Units by mouth every 7 (seven) days. Monday     No current facility-administered medications for this visit.         Physical Exam BP (!) 151/84 (BP Location: Right Arm, Patient Position: Sitting)   Pulse 85   Resp 16   Ht 5\' 11"  (1.803 m)   Wt 272 lb (123.4 kg)   BMI 37.94 kg/m  Gen:  WD/WN, NAD Skin: Left antecubital incision C/D/I and well-healed.  The brachiocephalic fistula on the left demonstrates an excellent thrill and bruit.  It is not quite large enough yet to begin cannulation.     Assessment/Plan:  1. Chronic kidney disease, stage V ([place]) Recommend:  The patient is doing well and currently has adequate dialysis access.  I believe the fistula needs to mature just a bit more it feels good excellent thrill and bruit but it still is somewhat on the smaller size.  I have written a note that the dialysis center can begin cannulating the fistula as of November 08, 2017  The patient's dialysis center is not reporting any access issues.  With respect to his concern regarding his occupation: There is no question that given the very physical nature of his job being able to continue working full-time while on dialysis is not likely.  In the best of  all possible worlds people would be able to continue there given occupation without any decrease in their physical stamina strength and without any interruptions in their work schedule.  The reality is that this is rarely true particularly for people such as Donald Silva that have a very physically demanding job.  I have asked that he work with his nephrologist and his primary medical at the [facility] clinic regarding the permanency of his disability.  Also factored into this would be the lack of any light duty opportunity at his workplace.  The patient should have a duplex ultrasound of the dialysis access in 6 months.  The patient will follow-up with me in the office after each ultrasound     Hortencia Pilar 10/11/2017, 9:53 AM   This note was created with Dragon medical transcription system.  Any errors from dictation are unintentional.

## 2017-12-03 DIAGNOSIS — L299 Pruritus, unspecified: Secondary | ICD-10-CM | POA: Insufficient documentation

## 2017-12-03 DIAGNOSIS — R52 Pain, unspecified: Secondary | ICD-10-CM | POA: Insufficient documentation

## 2017-12-03 DIAGNOSIS — D509 Iron deficiency anemia, unspecified: Secondary | ICD-10-CM | POA: Insufficient documentation

## 2017-12-03 DIAGNOSIS — R519 Headache, unspecified: Secondary | ICD-10-CM | POA: Insufficient documentation

## 2017-12-03 DIAGNOSIS — D631 Anemia in chronic kidney disease: Secondary | ICD-10-CM | POA: Insufficient documentation

## 2017-12-03 DIAGNOSIS — R509 Fever, unspecified: Secondary | ICD-10-CM | POA: Insufficient documentation

## 2017-12-03 DIAGNOSIS — R197 Diarrhea, unspecified: Secondary | ICD-10-CM | POA: Insufficient documentation

## 2017-12-03 DIAGNOSIS — R0602 Shortness of breath: Secondary | ICD-10-CM | POA: Insufficient documentation

## 2017-12-03 DIAGNOSIS — D689 Coagulation defect, unspecified: Secondary | ICD-10-CM | POA: Insufficient documentation

## 2017-12-03 DIAGNOSIS — E440 Moderate protein-calorie malnutrition: Secondary | ICD-10-CM | POA: Insufficient documentation

## 2017-12-03 DIAGNOSIS — N2581 Secondary hyperparathyroidism of renal origin: Secondary | ICD-10-CM | POA: Insufficient documentation

## 2017-12-03 DIAGNOSIS — Z23 Encounter for immunization: Secondary | ICD-10-CM | POA: Insufficient documentation

## 2018-04-17 NOTE — Progress Notes (Signed)
MRN : [id number]  Donald Silva is a 62 y.o. (10-Jul-1956) male who presents with chief complaint of No chief complaint on file. Donald Silva  History of Present Illness:   The patient returns to the office for follow up regarding problem with the dialysis access. Currently the patient is maintained via a left brachial cephalic fistula.  The patient has had multiple failed upper extremity accesses.  The patient notes a significant increase in bleeding time after decannulation.  The patient has also been informed that there is increased recirculation.    The patient denies hand pain or other symptoms consistent with steal phenomena.  No significant arm swelling.  The patient denies redness or swelling at the access site. The patient denies fever or chills at home or while on dialysis.  The patient denies amaurosis fugax or recent TIA symptoms. There are no recent neurological changes noted. The patient denies claudication symptoms or rest pain symptoms. The patient denies history of DVT, PE or superficial thrombophlebitis. The patient denies recent episodes of angina or shortness of breath.   Duplex ultrasound of the AV access shows a patent access.  The previously noted stenosis is much worse and now significant, compared to last study.      No outpatient medications have been marked as taking for the 04/18/18 encounter (Appointment) with Donald Silva, Donald Lory, MD.    Past Medical History:  Diagnosis Date  . CKD (chronic kidney disease) stage 5, GFR less than 15 ml/min (HCC)   . Diabetes mellitus without complication ([place])   . Dyspnea   . GERD (gastroesophageal reflux disease)   . Hypertension   . Lower extremity edema   . Myocardial infarction [name])     Past Surgical History:  Procedure Laterality Date  . AV FISTULA PLACEMENT Left 08/27/2017   Procedure: ARTERIOVENOUS (AV) FISTULA CREATION ( BRACHIAL CEPHALIC );  Surgeon: Katha Cabal, MD;  Location: [facility] ORS;  Service: Vascular;   Laterality: Left;  . CHOLECYSTECTOMY    . TOE SURGERY Right     Social History Social History   Tobacco Use  . Smoking status: Never Smoker  . Smokeless tobacco: Never Used  Substance Use Topics  . Alcohol use: No  . Drug use: No    Family History Family History  Problem Relation Age of Onset  . Hypertension Sister   . Diabetes Sister     Allergies  Allergen Reactions  . Penicillins Anaphylaxis    Was told it could kill him, pt unsure of reaction  Has patient had a PCN reaction causing immediate rash, facial/tongue/throat swelling, SOB or lightheadedness with hypotension: No Has patient had a PCN reaction causing severe rash involving mucus membranes or skin necrosis: No Has patient had a PCN reaction that required hospitalization: No Has patient had a PCN reaction occurring within the last 10 years: No If all of the above answers are "NO", then may proceed with Cephalosporin use.      REVIEW OF SYSTEMS (Negative unless checked)  Constitutional: [] Weight loss  [] Fever  [] Chills Cardiac: [] Chest pain   [] Chest pressure   [] Palpitations   [] Shortness of breath when laying flat   [] Shortness of breath with exertion. Vascular:  [] Pain in legs with walking   [] Pain in legs at rest  [] History of DVT   [] Phlebitis   [] Swelling in legs   [] Varicose veins   [] Non-healing ulcers Pulmonary:   [] Uses home oxygen   [] Productive cough   [] Hemoptysis   [] Wheeze  [] COPD   []   Asthma Neurologic:  [] Dizziness   [] Seizures   [] History of stroke   [] History of TIA  [] Aphasia   [] Vissual changes   [] Weakness or numbness in arm   [] Weakness or numbness in leg Musculoskeletal:   [] Joint swelling   [] Joint pain   [] Low back pain Hematologic:  [] Easy bruising  [] Easy bleeding   [] Hypercoagulable state   [] Anemic Gastrointestinal:  [] Diarrhea   [] Vomiting  [] Gastroesophageal reflux/heartburn   [] Difficulty swallowing. Genitourinary:  [x] Chronic kidney disease   [] Difficult urination  [] Frequent  urination   [] Blood in urine Skin:  [] Rashes   [] Ulcers  Psychological:  [] History of anxiety   []  History of major depression.  Physical Examination  There were no vitals filed for this visit. There is no height or weight on file to calculate BMI. Gen: WD/WN, NAD Head: [place]/AT, No temporalis wasting.  Ear/Nose/Throat: Hearing grossly intact, nares w/o erythema or drainage Eyes: PER, EOMI, sclera nonicteric.  Neck: Supple, no large masses.   Pulmonary:  Good air movement, no audible wheezing bilaterally, no use of accessory muscles.  Cardiac: RRR, no JVD Vascular: left brachial cephalic fistula is pulsatile with poor thrill Vessel Right Left  Radial Palpable Palpable  Ulnar Palpable Palpable  Brachial Palpable Palpable  Gastrointestinal: Non-distended. No guarding/no peritoneal signs.  Musculoskeletal: M/S 5/5 throughout.  No deformity or atrophy.  Neurologic: CN 2-12 intact. Symmetrical.  Speech is fluent. Motor exam as listed above. Psychiatric: Judgment intact, Mood & affect appropriate for pt's clinical situation. Dermatologic: No rashes or ulcers noted.  No changes consistent with cellulitis. Lymph : No lichenification or skin changes of chronic lymphedema.  CBC Lab Results  Component Value Date   WBC 8.2 08/20/2017   HGB 10.5 (L) 08/27/2017   HCT 31.0 (L) 08/27/2017   MCV 86.9 08/20/2017   PLT 206 08/20/2017    BMET    Component Value Date/Time   NA 145 08/27/2017 0652   NA 138 11/25/2013 0457   K 4.9 08/27/2017 0652   K 4.3 11/25/2013 0457   CL 110 08/20/2017 0957   CL 105 11/25/2013 0457   CO2 21 (L) 08/20/2017 0957   CO2 24 11/25/2013 0457   GLUCOSE 110 (H) 08/27/2017 0652   GLUCOSE 150 (H) 11/25/2013 0457   BUN 68 (H) 08/20/2017 0957   BUN 44 (H) 11/25/2013 0457   CREATININE 5.95 (H) 08/20/2017 0957   CREATININE 3.37 (H) 11/25/2013 0457   CALCIUM 8.2 (L) 08/20/2017 0957   CALCIUM 8.5 11/25/2013 0457   GFRNONAA 9 (L) 08/20/2017 0957   GFRNONAA 19 (L)  11/25/2013 0457   GFRAA 10 (L) 08/20/2017 0957   GFRAA 22 (L) 11/25/2013 0457   CrCl cannot be calculated (Patient's most recent lab result is older than the maximum 21 days allowed.).  COAG No results found for: INR, PROTIME  Radiology No results found.   Assessment/Plan 1. Chronic kidney disease, stage V ([place]) Recommend:  The patient is experiencing increasing problems with their dialysis access.  Patient should have a fistulagram with the intention for intervention.  The intention for intervention is to restore appropriate flow and prevent thrombosis and possible loss of the access.  As well as improve the quality of dialysis therapy.  The risks, benefits and alternative therapies were reviewed in detail with the patient.  All questions were answered.  The patient agrees to proceed with angio/intervention.    2. Essential hypertension Continue antihypertensive medications as already ordered, these medications have been reviewed and there are no changes at  this time.   3. Type 2 diabetes mellitus with other circulatory complication, unspecified whether long term insulin use (HCC) Continue hypoglycemic medications as already ordered, these medications have been reviewed and there are no changes at this time.  Hgb A1C to be monitored as already arranged by primary service     Hortencia Pilar, MD  04/17/2018 3:53 PM

## 2018-04-18 ENCOUNTER — Encounter (INDEPENDENT_AMBULATORY_CARE_PROVIDER_SITE_OTHER): Payer: Self-pay | Admitting: Vascular Surgery

## 2018-04-18 ENCOUNTER — Ambulatory Visit (INDEPENDENT_AMBULATORY_CARE_PROVIDER_SITE_OTHER): Payer: BLUE CROSS/BLUE SHIELD | Admitting: Vascular Surgery

## 2018-04-18 ENCOUNTER — Ambulatory Visit (INDEPENDENT_AMBULATORY_CARE_PROVIDER_SITE_OTHER): Payer: BLUE CROSS/BLUE SHIELD

## 2018-04-18 ENCOUNTER — Encounter (INDEPENDENT_AMBULATORY_CARE_PROVIDER_SITE_OTHER): Payer: Self-pay

## 2018-04-18 VITALS — BP 172/92 | HR 79 | Resp 16 | Ht 71.0 in | Wt 253.0 lb

## 2018-04-18 DIAGNOSIS — N185 Chronic kidney disease, stage 5: Secondary | ICD-10-CM

## 2018-04-18 DIAGNOSIS — T829XXS Unspecified complication of cardiac and vascular prosthetic device, implant and graft, sequela: Secondary | ICD-10-CM

## 2018-04-18 DIAGNOSIS — E1159 Type 2 diabetes mellitus with other circulatory complications: Secondary | ICD-10-CM

## 2018-04-18 DIAGNOSIS — I10 Essential (primary) hypertension: Secondary | ICD-10-CM

## 2018-04-20 ENCOUNTER — Encounter (INDEPENDENT_AMBULATORY_CARE_PROVIDER_SITE_OTHER): Payer: Self-pay | Admitting: Vascular Surgery

## 2018-04-20 DIAGNOSIS — T829XXA Unspecified complication of cardiac and vascular prosthetic device, implant and graft, initial encounter: Secondary | ICD-10-CM | POA: Insufficient documentation

## 2018-04-22 ENCOUNTER — Encounter (INDEPENDENT_AMBULATORY_CARE_PROVIDER_SITE_OTHER): Payer: Self-pay

## 2018-05-02 ENCOUNTER — Other Ambulatory Visit (INDEPENDENT_AMBULATORY_CARE_PROVIDER_SITE_OTHER): Payer: Self-pay | Admitting: Nurse Practitioner

## 2018-05-02 MED ORDER — CLINDAMYCIN PHOSPHATE 300 MG/50ML IV SOLN
300.0000 mg | Freq: Once | INTRAVENOUS | Status: AC
  Administered 2018-05-03: 300 mg via INTRAVENOUS

## 2018-05-03 ENCOUNTER — Ambulatory Visit
Admission: RE | Admit: 2018-05-03 | Discharge: 2018-05-03 | Disposition: A | Discharge status: Home / Self Care | Payer: BLUE CROSS/BLUE SHIELD | Source: Ambulatory Visit | Attending: Vascular Surgery | Admitting: Vascular Surgery

## 2018-05-03 ENCOUNTER — Encounter
Admission: RE | Disposition: A | Discharge status: Home / Self Care | Payer: Self-pay | Source: Ambulatory Visit | Attending: Vascular Surgery

## 2018-05-03 DIAGNOSIS — R600 Localized edema: Secondary | ICD-10-CM | POA: Insufficient documentation

## 2018-05-03 DIAGNOSIS — I252 Old myocardial infarction: Secondary | ICD-10-CM | POA: Diagnosis not present

## 2018-05-03 DIAGNOSIS — N186 End stage renal disease: Secondary | ICD-10-CM

## 2018-05-03 DIAGNOSIS — E1122 Type 2 diabetes mellitus with diabetic chronic kidney disease: Secondary | ICD-10-CM | POA: Insufficient documentation

## 2018-05-03 DIAGNOSIS — Z880 Allergy status to penicillin: Secondary | ICD-10-CM | POA: Insufficient documentation

## 2018-05-03 DIAGNOSIS — Z9889 Other specified postprocedural states: Secondary | ICD-10-CM | POA: Diagnosis not present

## 2018-05-03 DIAGNOSIS — Z992 Dependence on renal dialysis: Secondary | ICD-10-CM | POA: Insufficient documentation

## 2018-05-03 DIAGNOSIS — T8241XA Breakdown (mechanical) of vascular dialysis catheter, initial encounter: Secondary | ICD-10-CM

## 2018-05-03 DIAGNOSIS — I120 Hypertensive chronic kidney disease with stage 5 chronic kidney disease or end stage renal disease: Secondary | ICD-10-CM | POA: Insufficient documentation

## 2018-05-03 DIAGNOSIS — Z8249 Family history of ischemic heart disease and other diseases of the circulatory system: Secondary | ICD-10-CM | POA: Diagnosis not present

## 2018-05-03 DIAGNOSIS — Z833 Family history of diabetes mellitus: Secondary | ICD-10-CM | POA: Diagnosis not present

## 2018-05-03 DIAGNOSIS — E1159 Type 2 diabetes mellitus with other circulatory complications: Secondary | ICD-10-CM | POA: Diagnosis not present

## 2018-05-03 DIAGNOSIS — T82858A Stenosis of vascular prosthetic devices, implants and grafts, initial encounter: Secondary | ICD-10-CM | POA: Insufficient documentation

## 2018-05-03 DIAGNOSIS — Y832 Surgical operation with anastomosis, bypass or graft as the cause of abnormal reaction of the patient, or of later complication, without mention of misadventure at the time of the procedure: Secondary | ICD-10-CM | POA: Diagnosis not present

## 2018-05-03 DIAGNOSIS — Z9049 Acquired absence of other specified parts of digestive tract: Secondary | ICD-10-CM | POA: Insufficient documentation

## 2018-05-03 HISTORY — PX: A/V FISTULAGRAM: CATH118298

## 2018-05-03 LAB — POTASSIUM (ARMC VASCULAR LAB ONLY): Potassium (ARMC vascular lab): 4.6 (ref 3.5–5.1)

## 2018-05-03 LAB — GLUCOSE, CAPILLARY: Glucose-Capillary: 146 mg/dL — ABNORMAL HIGH (ref 70–99)

## 2018-05-03 SURGERY — A/V FISTULAGRAM
Anesthesia: Moderate Sedation

## 2018-05-03 MED ORDER — IOPAMIDOL (ISOVUE-300) INJECTION 61%
INTRAVENOUS | Status: DC | PRN
  Administered 2018-05-03: 25 mL via INTRA_ARTERIAL

## 2018-05-03 MED ORDER — NITROGLYCERIN 1 MG/10 ML FOR IR/CATH LAB
INTRA_ARTERIAL | Status: DC | PRN
  Administered 2018-05-03: 750 ug via INTRA_ARTERIAL

## 2018-05-03 MED ORDER — MIDAZOLAM HCL 2 MG/2ML IJ SOLN
INTRAMUSCULAR | Status: DC | PRN
Start: 2018-05-03 — End: 2018-05-03
  Administered 2018-05-03: 2 mg via INTRAVENOUS
  Administered 2018-05-03: 1 mg via INTRAVENOUS

## 2018-05-03 MED ORDER — SODIUM CHLORIDE 0.9 % IV SOLN: INTRAVENOUS | Status: DC

## 2018-05-03 MED ORDER — HEPARIN SODIUM (PORCINE) 1000 UNIT/ML IJ SOLN
INTRAMUSCULAR | Status: AC
  Filled 2018-05-03: qty 1

## 2018-05-03 MED ORDER — SODIUM CHLORIDE 0.9 % IJ SOLN
INTRAMUSCULAR | Status: AC
  Filled 2018-05-03: qty 50

## 2018-05-03 MED ORDER — HYDROMORPHONE HCL 1 MG/ML IJ SOLN: 1.0000 mg | Freq: Once | INTRAMUSCULAR | Status: DC | PRN

## 2018-05-03 MED ORDER — MIDAZOLAM HCL 5 MG/5ML IJ SOLN
INTRAMUSCULAR | Status: AC
  Filled 2018-05-03: qty 5

## 2018-05-03 MED ORDER — CLINDAMYCIN PHOSPHATE 300 MG/50ML IV SOLN
INTRAVENOUS | Status: AC
  Filled 2018-05-03: qty 50

## 2018-05-03 MED ORDER — FENTANYL CITRATE (PF) 100 MCG/2ML IJ SOLN
INTRAMUSCULAR | Status: AC
  Filled 2018-05-03: qty 2

## 2018-05-03 MED ORDER — HEPARIN SODIUM (PORCINE) 1000 UNIT/ML IJ SOLN
INTRAMUSCULAR | Status: DC | PRN
Start: 2018-05-03 — End: 2018-05-03
  Administered 2018-05-03: 3000 [IU] via INTRAVENOUS

## 2018-05-03 MED ORDER — ONDANSETRON HCL 4 MG/2ML IJ SOLN: 4.0000 mg | Freq: Four times a day (QID) | INTRAMUSCULAR | Status: DC | PRN

## 2018-05-03 MED ORDER — FENTANYL CITRATE (PF) 100 MCG/2ML IJ SOLN
INTRAMUSCULAR | Status: DC | PRN
  Administered 2018-05-03: 50 ug via INTRAVENOUS
  Administered 2018-05-03: 25 ug via INTRAVENOUS

## 2018-05-03 SURGICAL SUPPLY — 13 items
BALLN LUTONIX DCB 7X40X130 (BALLOONS) ×3
BALLOON LUTONIX DCB 7X40X130 (BALLOONS) ×1 IMPLANT
DEVICE PRESTO INFLATION (MISCELLANEOUS) ×3 IMPLANT
DRAPE BRACHIAL (DRAPES) ×3 IMPLANT
NEEDLE ENTRY 21GA 7CM ECHOTIP (NEEDLE) ×3 IMPLANT
PACK ANGIOGRAPHY (CUSTOM PROCEDURE TRAY) ×3 IMPLANT
SET INTRO CAPELLA COAXIAL (SET/KITS/TRAYS/PACK) ×3 IMPLANT
SHEATH BRITE TIP 6FRX5.5 (SHEATH) ×3 IMPLANT
SHEATH BRITE TIP 7FRX5.5 (SHEATH) ×3 IMPLANT
STENT VIABAHN 8X2.5X120 (Permanent Stent) ×1 IMPLANT
STENT VIABAHN 8X25X120 (Permanent Stent) ×2 IMPLANT
SUT MNCRL AB 4-0 PS2 18 (SUTURE) ×3 IMPLANT
WIRE G 018X200 V18 (WIRE) ×3 IMPLANT

## 2018-05-03 NOTE — Op Note (Signed)
OPERATIVE NOTE   PROCEDURE: 1. Contrast injection left brachiocephalic AV access 2. Percutaneous transluminal angioplasty and stent placement peripheral segment left brachial cephalic fistula  PRE-OPERATIVE DIAGNOSIS: Complication of dialysis access                                                       End Stage Renal Disease  POST-OPERATIVE DIAGNOSIS: same as above   SURGEON: Katha Cabal, M.D.  ANESTHESIA: Conscious sedation was administered under my direct supervision by the interventional radiology RN. IV Versed plus fentanyl were utilized. Continuous ECG, pulse oximetry and blood pressure was monitored throughout the entire procedure.  Conscious sedation was for a total of 31.  ESTIMATED BLOOD LOSS: minimal  FINDING(S): 1. 85% stenosis in the midportion of the cephalic vein  SPECIMEN(S):  None  CONTRAST: 25 cc  FLUOROSCOPY TIME: 0.7 minutes  INDICATIONS: Donald Silva is a 62 y.o. male who  presents with malfunctioning left arm brachiocephalic AV access.  The patient is scheduled for angiography with possible intervention of the AV access.  The patient is aware the risks include but are not limited to: bleeding, infection, thrombosis of the cannulated access, and possible anaphylactic reaction to the contrast.  The patient acknowledges if the access can not be salvaged a tunneled catheter will be needed and will be placed during this procedure.  The patient is aware of the risks of the procedure and elects to proceed with the angiogram and intervention.  DESCRIPTION: After full informed written consent was obtained, the patient was brought back to the Special Procedure suite and placed supine position.  Appropriate cardiopulmonary monitors were placed.  The left arm was prepped and draped in the standard fashion.  Appropriate timeout is called. The left brachiocephalic fistula was cannulated with a micropuncture needle.  Cannulation was performed with ultrasound guidance.  Ultrasound was placed in a sterile sleeve, the AV access was interrogated and noted to be echolucent and compressible indicating patency. Image was recorded for the permanent record. The puncture is performed under continuous ultrasound visualization.   The microwire was advanced and the needle was exchanged for  a microsheath.  The J-wire was then advanced and a 6 Fr sheath inserted.  Hand injections were completed to image the access from the arterial anastomosis through the entire access.  The central venous structures were also imaged by hand injections.  Based on the images,  3000 units of heparin was given and a wire was negotiated through the strictures within the venous portion of the graft.  An 8 mm x 25 mm Viabahn was deployed across the stenoses and postdilated with an 7 mm Lutonix drug-eluting balloon.  Follow-up imaging demonstrates complete resolution of the stricture with rapid flow of contrast through the graft, the central venous anatomy is preserved.  A 4-0 Monocryl purse-string suture was sewn around the sheath.  The sheath was removed and light pressure was applied.  A sterile bandage was applied to the puncture site.  Interpretation: Initial imaging of the fistula demonstrate a focal 85% stenosis in the peripheral segment.  The subclavian cephalic confluence and central veins are widely patent as is the arterial anastomosis.  Following angioplasty and stent placement there is now restoration of flow with less than 10% residual stenosis.  Small amount of spasm is treated with a single injection  of nitroglycerin successfully.  COMPLICATIONS: None  CONDITION: Donald Silva, M.D [facility] Office: [phone]  05/03/2018 11:20 AM

## 2018-05-03 NOTE — H&P (Signed)
[facility] History & Physical Update  The patient was interviewed and re-examined.  The patient's previous History and Physical has been reviewed and is unchanged.  There is no change in the plan of care. We plan to proceed with the scheduled procedure.  Hortencia Pilar, MD  05/03/2018, 10:36 AM

## 2018-05-24 ENCOUNTER — Other Ambulatory Visit (INDEPENDENT_AMBULATORY_CARE_PROVIDER_SITE_OTHER): Payer: Self-pay | Admitting: Vascular Surgery

## 2018-05-24 ENCOUNTER — Encounter (INDEPENDENT_AMBULATORY_CARE_PROVIDER_SITE_OTHER): Payer: BLUE CROSS/BLUE SHIELD

## 2018-05-24 ENCOUNTER — Encounter (INDEPENDENT_AMBULATORY_CARE_PROVIDER_SITE_OTHER): Payer: Self-pay

## 2018-05-24 ENCOUNTER — Ambulatory Visit (INDEPENDENT_AMBULATORY_CARE_PROVIDER_SITE_OTHER): Payer: BLUE CROSS/BLUE SHIELD | Admitting: Nurse Practitioner

## 2018-05-24 DIAGNOSIS — N186 End stage renal disease: Secondary | ICD-10-CM

## 2018-06-16 ENCOUNTER — Ambulatory Visit: Payer: BLUE CROSS/BLUE SHIELD | Admitting: Podiatry

## 2018-06-27 ENCOUNTER — Ambulatory Visit: Payer: BLUE CROSS/BLUE SHIELD | Admitting: Podiatry

## 2018-07-03 ENCOUNTER — Other Ambulatory Visit: Payer: Self-pay

## 2018-07-03 ENCOUNTER — Emergency Department
Admission: EM | Admit: 2018-07-03 | Discharge: 2018-07-03 | Disposition: A | Discharge status: Home / Self Care | Payer: BLUE CROSS/BLUE SHIELD | Attending: Emergency Medicine | Admitting: Emergency Medicine

## 2018-07-03 ENCOUNTER — Encounter: Payer: Self-pay | Admitting: Emergency Medicine

## 2018-07-03 DIAGNOSIS — Z7982 Long term (current) use of aspirin: Secondary | ICD-10-CM | POA: Insufficient documentation

## 2018-07-03 DIAGNOSIS — Z79899 Other long term (current) drug therapy: Secondary | ICD-10-CM | POA: Diagnosis not present

## 2018-07-03 DIAGNOSIS — Z992 Dependence on renal dialysis: Secondary | ICD-10-CM | POA: Insufficient documentation

## 2018-07-03 DIAGNOSIS — N185 Chronic kidney disease, stage 5: Secondary | ICD-10-CM | POA: Insufficient documentation

## 2018-07-03 DIAGNOSIS — G629 Polyneuropathy, unspecified: Secondary | ICD-10-CM

## 2018-07-03 DIAGNOSIS — I120 Hypertensive chronic kidney disease with stage 5 chronic kidney disease or end stage renal disease: Secondary | ICD-10-CM | POA: Insufficient documentation

## 2018-07-03 DIAGNOSIS — R200 Anesthesia of skin: Secondary | ICD-10-CM | POA: Diagnosis present

## 2018-07-03 DIAGNOSIS — E1140 Type 2 diabetes mellitus with diabetic neuropathy, unspecified: Secondary | ICD-10-CM | POA: Diagnosis not present

## 2018-07-03 MED ORDER — GABAPENTIN 300 MG PO CAPS: 300.0000 mg | ORAL_CAPSULE | Freq: Three times a day (TID) | ORAL | 0 refills | Status: DC

## 2018-07-03 MED ORDER — DIPHENHYDRAMINE HCL 25 MG PO CAPS
25.0000 mg | ORAL_CAPSULE | Freq: Once | ORAL | Status: AC
  Administered 2018-07-03: 25 mg via ORAL
  Filled 2018-07-03: qty 1

## 2018-07-03 MED ORDER — GABAPENTIN 300 MG PO CAPS
300.0000 mg | ORAL_CAPSULE | Freq: Once | ORAL | Status: AC
  Administered 2018-07-03: 300 mg via ORAL
  Filled 2018-07-03: qty 1

## 2018-07-03 MED ORDER — IBUPROFEN 800 MG PO TABS
800.0000 mg | ORAL_TABLET | Freq: Once | ORAL | Status: AC
  Administered 2018-07-03: 800 mg via ORAL
  Filled 2018-07-03: qty 1

## 2018-07-03 NOTE — ED Notes (Signed)
Pt was unable to take his BP medicine due to dialysis.

## 2018-07-03 NOTE — ED Triage Notes (Signed)
Pt c/o left leg numbness x 2 weeks due to numbness in left leg. Pt also states he has been having itching in his back which has been causing him to not sleep.  Pt having dialysis and states the numbness got worse during dialysis.  Pt states his feet get cold as well. Pt states he took Tylenol this morning as well.   Pt did not complete dialysis due to pain and numbness. Pt normally goes MWF to dialysis.

## 2018-07-03 NOTE — ED Provider Notes (Signed)
[facility] Emergency Department Provider Note ____________________________________________   I have reviewed the triage vital signs and the triage nursing note.  HISTORY  Chief Complaint Numbness and Pruritis   Historian Patient and wife  HPI Donald Silva is a 62 y.o. male presents from dialysis complaining of left ankle and foot pain.  Sounds like for the past 1 week is been bothering him pretty severely at night making it difficult to sleep.  He is been taking ibuprofen 800 which seems to help.  States that he was previously prescribed Neurontin, sounds like was supposed to be 300 mg tablets but was prescribed 100 mg tablets instead.  He ran out of these about a week ago.  No skin rash.  States that times his left foot feels cold.  There is no joint pain or swelling.  No fevers.  In addition as an additional complaint he has been itching on the top upper back, uncertain etiology.  Wife states they were trying to get a Neurontin prescription from the dialysis clinic, but they were not getting a phone call back about that.  He was on dialysis today Sunday in preparation for holiday schedule.  He did not get a hold dialysis, but states that he has been underweight in any case.  He is not complaining of any chest pain or trouble breathing.     Past Medical History:  Diagnosis Date  . CKD (chronic kidney disease) stage 5, GFR less than 15 ml/min (HCC)   . Diabetes mellitus without complication ([place])   . Dyspnea   . GERD (gastroesophageal reflux disease)   . Hypertension   . Lower extremity edema   . Myocardial infarction [facility])     Patient Active Problem List   Diagnosis Date Noted  . Complication of vascular access for dialysis 04/20/2018  . Chronic kidney disease, stage V ([place]) 08/11/2017  . Essential hypertension 08/11/2017  . 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[place]) 08/11/2017  . Hyperkalemia 06/24/2017    Past Surgical  History:  Procedure Laterality Date  . A/V FISTULAGRAM N/A 05/03/2018   Procedure: A/V FISTULAGRAM;  Surgeon: Katha Cabal, MD;  Location: [facility];  Service: Cardiovascular;  Laterality: N/A;  . AV FISTULA PLACEMENT Left 08/27/2017   Procedure: ARTERIOVENOUS (AV) FISTULA CREATION ( BRACHIAL CEPHALIC );  Surgeon: Katha Cabal, MD;  Location: [facility] ORS;  Service: Vascular;  Laterality: Left;  . CHOLECYSTECTOMY    . TOE SURGERY Right     Prior to Admission medications   Medication Sig Start Date End Date Taking? Authorizing Provider  amLODipine (NORVASC) 5 MG tablet Take 5 mg by mouth daily.  09/10/17   [provider]  aspirin EC 81 MG tablet Take 81 mg daily by mouth.    [provider]  b complex-vitamin c-folic acid (NEPHRO-VITE) 0.8 MG TABS tablet Take 1 tablet by mouth every Monday, Wednesday, and Friday. After dialysis treatment 03/11/18   [provider]  calcium acetate (PHOSLO) 667 MG capsule Take 1,334 mg by mouth 3 (three) times daily with meals.  01/28/18   [provider]  calcium carbonate (TUMS EX) 750 MG chewable tablet Chew 2 tablets by mouth at bedtime.    [provider]  gabapentin (NEURONTIN) 100 MG capsule Take 100 mg by mouth daily as needed (leg pain.).    [provider]  Insulin Detemir (LEVEMIR FLEXTOUCH) 100 UNIT/ML Pen Inject 66 Units into the skin at bedtime.  06/01/16  [provider]  lisinopril (PRINIVIL,ZESTRIL) 40 MG tablet Take 40 mg by mouth daily.  04/15/18 04/15/19  [provider]  metoprolol succinate (TOPROL-XL) 25 MG 24 hr tablet Take 25 mg by mouth daily.  09/07/17 09/07/18  [provider]    Allergies  Allergen Reactions  . Penicillins Anaphylaxis    Was told it could kill him, pt unsure of reaction  Has patient had a PCN reaction causing immediate rash, facial/tongue/throat swelling, SOB or lightheadedness with hypotension: No Has patient had a PCN  reaction causing severe rash involving mucus membranes or skin necrosis: No Has patient had a PCN reaction that required hospitalization: No Has patient had a PCN reaction occurring within the last 10 years: No If all of the above answers are "NO", then may proceed with Cephalosporin use.     Family History  Problem Relation Age of Onset  . Hypertension Sister   . Diabetes Sister     Social History Social History   Tobacco Use  . Smoking status: Never Smoker  . Smokeless tobacco: Never Used  Substance Use Topics  . Alcohol use: No  . Drug use: No    Review of Systems  Constitutional: Negative for fever. Eyes: Negative for visual changes. ENT: Negative for sore throat. Cardiovascular: Negative for chest pain. Respiratory: Negative for shortness of breath. Gastrointestinal: Negative for abdominal pain, vomiting and diarrhea. Genitourinary: Negative for dysuria. Musculoskeletal: Chronic back pain. Skin: Negative for rash. Neurological: Negative for headache.  ____________________________________________   PHYSICAL EXAM:  VITAL SIGNS: ED Triage Vitals  Enc Vitals Group     BP 07/03/18 1201 (!) 217/93     Pulse Rate 07/03/18 1201 75     Resp 07/03/18 1201 18     Temp 07/03/18 1201 97.8 F (36.6 C)     Temp Source 07/03/18 1201 Oral     SpO2 07/03/18 1201 100 %     Weight 07/03/18 1201 258 lb 6.1 oz (117.2 kg)     Height 07/03/18 1201 5\' 11"  (1.803 m)     Head Circumference --      Peak Flow --      Pain Score 07/03/18 1200 10     Pain Loc --      Pain Edu? --      Excl. in [place]? --      Constitutional: Alert and oriented.  HEENT      Head: Normocephalic and atraumatic.      Eyes: Conjunctivae are normal. Pupils equal and round.       Ears:         Nose: No congestion/rhinnorhea.      Mouth/Throat: Mucous membranes are moist.      Neck: No stridor. Cardiovascular/Chest: Normal rate, regular rhythm.  No murmurs, rubs, or gallops. Respiratory: Normal  respiratory effort without tachypnea nor retractions. Breath sounds are clear and equal bilaterally. No wheezes/rales/rhonchi. Gastrointestinal: Soft. No distention, no guarding, no rebound. Nontender.    Genitourinary/rectal:Deferred Musculoskeletal: Nontender with normal range of motion in all extremities. No joint effusions.  No lower extremity tenderness.  No edema.  No joint swelling.  Neurovascular intact bilateral lower extremities.  Left foot pain although there is no redness or swelling there.  Right great toe amputation. Neurologic:  Normal speech and language. No gross or focal neurologic deficits are appreciated. Skin:  Skin is warm, dry and intact. No rash noted. Psychiatric: Mood and affect are normal. Speech and behavior are normal. Patient exhibits appropriate insight and judgment.  ____________________________________________  LABS (pertinent positives/negatives) I, Lisa Roca, MD the attending physician have reviewed the labs noted below.  Labs Reviewed - No data to display  ____________________________________________    EKG I, Lisa Roca, MD, the attending physician have personally viewed and interpreted all ECGs.  None ____________________________________________  RADIOLOGY   None __________________________________________  PROCEDURES  Procedure(s) performed: None  Procedures  Critical Care performed: None   ____________________________________________  ED COURSE / ASSESSMENT AND PLAN  Pertinent labs & imaging results that were available during my care of the patient were reviewed by me and considered in my medical decision making (see chart for details).     Patient is here for evaluation of left ankle to foot pain.  He has had right great toe amputation, and I suspect he does have peripheral vascular disease, however he does not appear to be having symptoms that are any worse with exertion or walking, so it seems less likely that the pain  that he is describing at his foot is due to arterial insufficiency.  In any case, there is certainly no evidence of an acute arterial compromise or ischemia.  He has a pink and normal sensation to the left lower extremity/neurovascular intact.  Think symptoms are related to gout although we discussed that.  At these having neuropathy pain.  It sound like his doctor had prescribed Neurontin but he is not been on very high dose and is out of medication now.  We discussed NSAID for anti-inflammatory effect as well as starting on Neurontin at 300 mg 3 times a day and then he can discuss with his primary care doctor about increasing that if it is not effective.  In terms of the pruritus, he does not have any obvious skin rash in the area that he is been complaining of back itchiness.  Will refer him to primary care for that.    CONSULTATIONS:   None  Patient / Family / Caregiver informed of clinical course, medical decision-making process, and agree with plan.   I discussed return precautions, follow-up instructions, and discharge instructions with patient and/or family.  Discharge Instructions : You were evaluated for left foot pain and as we discussed your exam and evaluation are reassuring in the ER today.  I suspect nerve pain called neuropathy and you are being restarted at a higher dose of gabapentin (neuropathy).    Return to the ER for any worsening condition including new or worsening or uncontrolled pain, skin rash, redness, swelling, join pain, blue or cold foot, numbness or any other symptoms concerning to you.    ___________________________________________   FINAL CLINICAL IMPRESSION(S) / ED DIAGNOSES   Final diagnoses:  Neuropathy      ___________________________________________         Note: This dictation was prepared with Dragon dictation. Any transcriptional errors that result from this process are unintentional    Lisa Roca, MD 07/03/18 1307

## 2018-07-03 NOTE — Discharge Instructions (Addendum)
You were evaluated for left foot pain and as we discussed your exam and evaluation are reassuring in the ER today.  I suspect nerve pain called neuropathy and you are being restarted at a higher dose of gabapentin (neuropathy).    Return to the ER for any worsening condition including new or worsening or uncontrolled pain, skin rash, redness, swelling, join pain, blue or cold foot, numbness or any other symptoms concerning to you.

## 2018-07-05 ENCOUNTER — Encounter (INDEPENDENT_AMBULATORY_CARE_PROVIDER_SITE_OTHER): Payer: BLUE CROSS/BLUE SHIELD

## 2018-07-05 ENCOUNTER — Ambulatory Visit (INDEPENDENT_AMBULATORY_CARE_PROVIDER_SITE_OTHER): Payer: BLUE CROSS/BLUE SHIELD | Admitting: Nurse Practitioner

## 2018-07-18 ENCOUNTER — Encounter (INDEPENDENT_AMBULATORY_CARE_PROVIDER_SITE_OTHER): Payer: Self-pay | Admitting: Nurse Practitioner

## 2018-09-15 ENCOUNTER — Ambulatory Visit: Payer: BLUE CROSS/BLUE SHIELD | Admitting: Podiatry

## 2018-09-15 ENCOUNTER — Encounter: Payer: Self-pay | Admitting: Podiatry

## 2018-09-15 VITALS — BP 234/112 | HR 112

## 2018-09-15 DIAGNOSIS — M79674 Pain in right toe(s): Secondary | ICD-10-CM | POA: Diagnosis not present

## 2018-09-15 DIAGNOSIS — E1142 Type 2 diabetes mellitus with diabetic polyneuropathy: Secondary | ICD-10-CM | POA: Diagnosis not present

## 2018-09-15 DIAGNOSIS — S98131S Complete traumatic amputation of one right lesser toe, sequela: Secondary | ICD-10-CM

## 2018-09-15 DIAGNOSIS — M79675 Pain in left toe(s): Secondary | ICD-10-CM

## 2018-09-15 DIAGNOSIS — B351 Tinea unguium: Secondary | ICD-10-CM | POA: Diagnosis not present

## 2018-09-15 DIAGNOSIS — L84 Corns and callosities: Secondary | ICD-10-CM | POA: Diagnosis not present

## 2018-09-15 NOTE — Progress Notes (Signed)
This patient presents the office for an evaluation and treatment of his diabetic feet.  He says that he has pain noted in office toenails especially the toenail on the left foot.  He says he also has a painful callus on the bottom of his right forefoot.  Patient states he is unable to self treat.  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  Patient has been diagnosed as diabetic with neuropathy for which he takes gabapentin.  He presents the office the office today for an evaluation of his diabetic feet.  General Appearance  Alert, conversant and in no acute stress.  Vascular  Dorsalis pedis and posterior tibial  pulses are palpable  bilaterally.  Capillary return is within normal limits  bilaterally. Temperature is within normal limits  bilaterally.  Neurologic  Senn-Weinstein monofilament wire test diminished/absent    bilaterally. Muscle power within normal limits bilaterally.  Nails Thick disfigured discolored nails with subungual debris  from hallux to fifth toes bilaterally. No evidence of bacterial infection or drainage bilaterally.  Orthopedic  No limitations of motion  feet .  No crepitus or effusions noted.  No bony pathology or digital deformities noted.  Skin  normotropic skin with no porokeratosis noted bilaterally.  No signs of infections or ulcers noted.  Preulcerous callus noted sub 1 right foot.  Hemorrhagic callus noted.  Onychomycosis  B/L  Pre-ulcerous callus sub 1 right foot.  IE.  Debride nails  X 10.  Debride callus right foot.  Discussed his diabetes with this patient.  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  He was told to return to the office in 3 months for an evaluation and treatment with myself.  He is also to make an appointment with Liliane Channel to obtain diabetic shoes.   Gardiner Barefoot DPM

## 2018-09-30 ENCOUNTER — Encounter: Payer: Self-pay | Admitting: Podiatry

## 2018-09-30 ENCOUNTER — Ambulatory Visit (INDEPENDENT_AMBULATORY_CARE_PROVIDER_SITE_OTHER): Payer: BLUE CROSS/BLUE SHIELD | Admitting: Podiatry

## 2018-09-30 ENCOUNTER — Telehealth: Payer: Self-pay | Admitting: Podiatry

## 2018-09-30 ENCOUNTER — Ambulatory Visit (INDEPENDENT_AMBULATORY_CARE_PROVIDER_SITE_OTHER): Payer: BLUE CROSS/BLUE SHIELD

## 2018-09-30 ENCOUNTER — Ambulatory Visit: Payer: BLUE CROSS/BLUE SHIELD | Admitting: Podiatry

## 2018-09-30 DIAGNOSIS — L84 Corns and callosities: Secondary | ICD-10-CM | POA: Diagnosis not present

## 2018-09-30 DIAGNOSIS — L97512 Non-pressure chronic ulcer of other part of right foot with fat layer exposed: Secondary | ICD-10-CM | POA: Diagnosis not present

## 2018-09-30 DIAGNOSIS — E0843 Diabetes mellitus due to underlying condition with diabetic autonomic (poly)neuropathy: Secondary | ICD-10-CM | POA: Diagnosis not present

## 2018-09-30 MED ORDER — DOXYCYCLINE HYCLATE 100 MG PO TABS: 100.0000 mg | ORAL_TABLET | Freq: Two times a day (BID) | ORAL | 0 refills | Status: DC

## 2018-09-30 NOTE — Telephone Encounter (Signed)
Patient called the office this morning saying his foot opened up last night at the site of his bunion(callus).  He says there is already a bad odor at the site of the bunion.  Told this patient to come into the office this morning to be seen .  He was seen by myself on for preventative foot care services earlier this months.    Gardiner Barefoot DPM

## 2018-10-03 LAB — WOUND CULTURE: Organism ID, Bacteria: NONE SEEN

## 2018-10-03 NOTE — Progress Notes (Signed)
   Subjective:  63 year old male with PMHx of DM presents to the office today with a chief complaint of an ulceration of the right foot that began a few weeks ago. He was seen by Dr. Prudence Davidson earlier in the month and had the pre-ulcerative callus lesion shaved down but states it has since cracked open and has a malodor. He has not done anything at home for treatment. Walking and applying pressure to the area increases the pain. Patient is here for further evaluation and treatment.   Past Medical History:  Diagnosis Date  . CKD (chronic kidney disease) stage 5, GFR less than 15 ml/min (HCC)   . Diabetes mellitus without complication ([place])   . Dyspnea   . GERD (gastroesophageal reflux disease)   . Hypertension   . Lower extremity edema   . Myocardial infarction [facility])      Objective/Physical Exam General: The patient is alert and oriented x3 in no acute distress.  Dermatology:  Wound #1 noted to the right sub-first MPJ measuring 2.5 x 2.5 x 0.3 cm (LxWxD).   To the noted ulceration(s), there is no eschar. There is a moderate amount of slough, fibrin, and necrotic tissue noted. Granulation tissue and wound base is red. There is a minimal amount of serosanguineous drainage noted. There is no exposed bone muscle-tendon ligament or joint. There is no malodor. Periwound integrity is intact. Skin is warm, dry and supple bilateral lower extremities.  Vascular: Palpable pedal pulses bilaterally. No edema or erythema noted. Capillary refill within normal limits.  Neurological: Epicritic and protective threshold diminished bilaterally.   Musculoskeletal Exam: Range of motion within normal limits to all pedal and ankle joints bilateral. Muscle strength 5/5 in all groups bilateral.   Radiographic Exam:  Normal osseous mineralization. Joint spaces preserved. No fracture/dislocation/boney destruction.    Assessment: #1 ulceration of the right sub-first MPJ secondary to diabetes mellitus #2 diabetes  mellitus w/ peripheral neuropathy #3 h/o right 1st and 5th digit amputations    Plan of Care:  #1 Patient was evaluated. X-Rays reviewed.  #2 medically necessary excisional debridement including subcutaneous tissue was performed using a tissue nipper and a chisel blade. Excisional debridement of all the necrotic nonviable tissue down to healthy bleeding viable tissue was performed with post-debridement measurements same as pre-. #3 the wound was cleansed and dry sterile dressing applied. #4 Has appointment with Liliane Channel, Pedorthist, for DM shoes and insoles on 10/19/2018.  #5 Recommended using Betadine daily with a dry sterile dressing.  #6 Culture taken from wound.  #7 Prescription for Doxycycline #20 provided to patient.  #8 Surgical shoe dispensed.  #9 patient is to return to clinic in 3 weeks.   Edrick Kins, DPM [facility]  Dr. Edrick Kins, [place]                                        [place], [place] [address]                Office [phone]  Fax [phone]

## 2018-10-18 ENCOUNTER — Encounter: Payer: Self-pay | Admitting: Podiatry

## 2018-10-18 ENCOUNTER — Ambulatory Visit (INDEPENDENT_AMBULATORY_CARE_PROVIDER_SITE_OTHER): Payer: BLUE CROSS/BLUE SHIELD | Admitting: Podiatry

## 2018-10-18 DIAGNOSIS — E0843 Diabetes mellitus due to underlying condition with diabetic autonomic (poly)neuropathy: Secondary | ICD-10-CM | POA: Diagnosis not present

## 2018-10-18 DIAGNOSIS — L97512 Non-pressure chronic ulcer of other part of right foot with fat layer exposed: Secondary | ICD-10-CM | POA: Diagnosis not present

## 2018-10-18 DIAGNOSIS — L989 Disorder of the skin and subcutaneous tissue, unspecified: Secondary | ICD-10-CM

## 2018-10-18 MED ORDER — GENTAMICIN SULFATE 0.1 % EX CREA: 1.0000 "application " | TOPICAL_CREAM | Freq: Two times a day (BID) | CUTANEOUS | 1 refills | Status: DC

## 2018-10-19 ENCOUNTER — Other Ambulatory Visit: Payer: BLUE CROSS/BLUE SHIELD | Admitting: Orthotics

## 2018-10-19 ENCOUNTER — Other Ambulatory Visit: Payer: Self-pay

## 2018-10-19 ENCOUNTER — Ambulatory Visit: Payer: BLUE CROSS/BLUE SHIELD | Admitting: Orthotics

## 2018-10-19 DIAGNOSIS — S98131S Complete traumatic amputation of one right lesser toe, sequela: Secondary | ICD-10-CM

## 2018-10-19 DIAGNOSIS — E1142 Type 2 diabetes mellitus with diabetic polyneuropathy: Secondary | ICD-10-CM

## 2018-10-19 DIAGNOSIS — E0843 Diabetes mellitus due to underlying condition with diabetic autonomic (poly)neuropathy: Secondary | ICD-10-CM

## 2018-10-19 DIAGNOSIS — L84 Corns and callosities: Secondary | ICD-10-CM

## 2018-10-19 DIAGNOSIS — L97512 Non-pressure chronic ulcer of other part of right foot with fat layer exposed: Secondary | ICD-10-CM

## 2018-10-19 NOTE — Progress Notes (Signed)
Patient was measured for med necessity extra depth shoes (x2) w/ 3 left and 1 RT l5000 toe tillercustom f/o. Patient was measured w/ brannock to determine size and width.  Foam impression mold was achieved and deemed appropriate for fabrication of  cmfo.   See DPM chart notes for further documentation and dx codes for determination of medical necessity.  Appropriate forms will be sent to PCP to verify and sign off on medical necessity.

## 2018-10-20 NOTE — Progress Notes (Signed)
   Subjective:  63 year old male with PMHx of DM presents to the office today for follow up evaluation of an ulceration of the right foot. He states he is doing well and the wound looks better. He notes painful callus lesions of the right foot as well. Walking and bearing weight increases the pain. He has completed the course of the Doxycycline as directed. Patient is here for further evaluation and treatment.   Past Medical History:  Diagnosis Date  . CKD (chronic kidney disease) stage 5, GFR less than 15 ml/min (HCC)   . Diabetes mellitus without complication ([place])   . Dyspnea   . GERD (gastroesophageal reflux disease)   . Hypertension   . Lower extremity edema   . Myocardial infarction [facility])      Objective/Physical Exam General: The patient is alert and oriented x3 in no acute distress.  Dermatology:  Wound #1 noted to the right sub-first MPJ measuring 0.8 x 0.8 x 0.2 cm (LxWxD).   To the noted ulceration(s), there is no eschar. There is a moderate amount of slough, fibrin, and necrotic tissue noted. Granulation tissue and wound base is red. There is a minimal amount of serosanguineous drainage noted. There is no exposed bone muscle-tendon ligament or joint. There is no malodor. Periwound integrity is intact. Skin is warm, dry and supple bilateral lower extremities. Hyperkeratotic lesions present on the right foot x 3. Pain on palpation with a central nucleated core noted.   Vascular: Palpable pedal pulses bilaterally. No edema or erythema noted. Capillary refill within normal limits.  Neurological: Epicritic and protective threshold diminished bilaterally.   Musculoskeletal Exam: Range of motion within normal limits to all pedal and ankle joints bilateral. Muscle strength 5/5 in all groups bilateral.   Assessment: #1 ulceration of the right sub-first MPJ secondary to diabetes mellitus #2 diabetes mellitus w/ peripheral neuropathy #3 h/o right 1st and 5th digit amputations  #4  Pre-ulcerative callus lesions noted to the right foot x 3   Plan of Care:  #2 medically necessary excisional debridement including subcutaneous tissue was performed using a tissue nipper and a chisel blade. Excisional debridement of all the necrotic nonviable tissue down to healthy bleeding viable tissue was performed with post-debridement measurements same as pre-. #3 the wound was cleansed and dry sterile dressing applied. #4 Prescription for Gentamicin cream provided to patient to use daily with a bandage.  #5 Excisional debridement of keratotic lesions x 3 using a chisel blade was performed without incident. Light dressing applied.  #6 Continue using post op shoe.  #7 Return to clinic in 3 weeks.    Edrick Kins, DPM [facility]  Dr. Edrick Kins, [place]                                        [place], [place] [address]                Office [phone]  Fax [phone]

## 2018-11-08 ENCOUNTER — Ambulatory Visit: Payer: BLUE CROSS/BLUE SHIELD | Admitting: Podiatry

## 2018-11-09 ENCOUNTER — Other Ambulatory Visit: Payer: BLUE CROSS/BLUE SHIELD | Admitting: Orthotics

## 2018-11-15 ENCOUNTER — Ambulatory Visit: Payer: BLUE CROSS/BLUE SHIELD | Admitting: Podiatry

## 2018-11-15 ENCOUNTER — Other Ambulatory Visit: Payer: Self-pay

## 2018-11-15 ENCOUNTER — Ambulatory Visit (INDEPENDENT_AMBULATORY_CARE_PROVIDER_SITE_OTHER): Payer: BLUE CROSS/BLUE SHIELD | Admitting: Podiatry

## 2018-11-15 ENCOUNTER — Encounter: Payer: Self-pay | Admitting: Podiatry

## 2018-11-15 VITALS — Temp 98.8°F

## 2018-11-15 DIAGNOSIS — E0843 Diabetes mellitus due to underlying condition with diabetic autonomic (poly)neuropathy: Secondary | ICD-10-CM

## 2018-11-15 DIAGNOSIS — L97512 Non-pressure chronic ulcer of other part of right foot with fat layer exposed: Secondary | ICD-10-CM

## 2018-11-15 DIAGNOSIS — E1142 Type 2 diabetes mellitus with diabetic polyneuropathy: Secondary | ICD-10-CM

## 2018-11-15 NOTE — Progress Notes (Signed)
   Subjective:  63 year old male with PMHx of DM presents to the office today for follow up evaluation of an ulceration of the right foot.  Patient states that he is noticed some black spots on the plantar aspect of the foot.  Since last visit on 10/18/2018 he has been applying antibiotic cream and a light dressing.  He presents for further treatment evaluation  Past Medical History:  Diagnosis Date  . CKD (chronic kidney disease) stage 5, GFR less than 15 ml/min (HCC)   . Diabetes mellitus without complication ([place])   . Dyspnea   . GERD (gastroesophageal reflux disease)   . Hypertension   . Lower extremity edema   . Myocardial infarction [facility])      Objective/Physical Exam General: The patient is alert and oriented x3 in no acute distress.  Dermatology:  Wound #1 noted to the right sub-first MPJ measuring 1.31.0 x 0.32 cm (LxWxD).   To the noted ulceration(s), there is no eschar. There is a moderate amount of slough, fibrin, and necrotic tissue noted. Granulation tissue and wound base is red. There is a minimal amount of serosanguineous drainage noted. There is no exposed bone muscle-tendon ligament or joint. There is no malodor. Periwound integrity is intact. Skin is warm, dry and supple bilateral lower extremities. Hyperkeratotic lesions present on the right foot x 3. Pain on palpation with a central nucleated core noted.   Vascular: Palpable pedal pulses bilaterally. No edema or erythema noted. Capillary refill within normal limits.  Neurological: Epicritic and protective threshold diminished bilaterally.   Musculoskeletal Exam: Range of motion within normal limits to all pedal and ankle joints bilateral. Muscle strength 5/5 in all groups bilateral.   Assessment: #1 ulceration of the right sub-first MPJ secondary to diabetes mellitus #2 diabetes mellitus w/ peripheral neuropathy #3 h/o right 1st and 5th digit amputations  #4 Pre-ulcerative callus lesions noted to the right foot  x 3   Plan of Care:  #2 medically necessary excisional debridement including subcutaneous tissue was performed using a tissue nipper and a chisel blade. Excisional debridement of all the necrotic nonviable tissue down to healthy bleeding viable tissue was performed with post-debridement measurements same as pre-. #3 the wound was cleansed and dry sterile dressing applied. #4  Collagen and aquacel Ag applied to the ulceration with dry sterile dressing.  Keep clean dry and intact x1 week #5 continue weightbearing in the postoperative shoe.  New postoperative shoe was dispensed today #6 return to clinic in 1 week   Edrick Kins, DPM [facility]  Dr. Edrick Kins, [address]                                        [place], [place] [address]                Office [phone]  Fax [phone]

## 2018-11-22 ENCOUNTER — Encounter: Payer: Self-pay | Admitting: Podiatry

## 2018-11-22 ENCOUNTER — Ambulatory Visit (INDEPENDENT_AMBULATORY_CARE_PROVIDER_SITE_OTHER): Payer: Medicare Other | Admitting: Podiatry

## 2018-11-22 ENCOUNTER — Other Ambulatory Visit: Payer: Self-pay

## 2018-11-22 VITALS — Temp 98.7°F

## 2018-11-22 DIAGNOSIS — E0843 Diabetes mellitus due to underlying condition with diabetic autonomic (poly)neuropathy: Secondary | ICD-10-CM

## 2018-11-22 DIAGNOSIS — S98131S Complete traumatic amputation of one right lesser toe, sequela: Secondary | ICD-10-CM

## 2018-11-22 DIAGNOSIS — L97512 Non-pressure chronic ulcer of other part of right foot with fat layer exposed: Secondary | ICD-10-CM | POA: Diagnosis not present

## 2018-11-22 DIAGNOSIS — M79671 Pain in right foot: Secondary | ICD-10-CM

## 2018-11-22 NOTE — Progress Notes (Signed)
   Subjective:  63 year old male with PMHx of DM presents to the office today for follow up evaluation of an ulceration of the right foot.  Patient relates that the ulceration is improved significantly.  Last visit on 11/15/2018 dressings were applied and he left them clean dry and intact for 3 days.  After that he has been changing the Band-Aid daily.  He continues to weight-bear in his postoperative shoe as directed.  Past Medical History:  Diagnosis Date  . CKD (chronic kidney disease) stage 5, GFR less than 15 ml/min (HCC)   . Diabetes mellitus without complication ([place])   . Dyspnea   . GERD (gastroesophageal reflux disease)   . Hypertension   . Lower extremity edema   . Myocardial infarction [facility])      Objective/Physical Exam General: The patient is alert and oriented x3 in no acute distress.  Dermatology:  Wound #1 noted to the right sub-first MPJ measuring 0.7 x 0.7 x 0.2 cm (LxWxD).   To the noted ulceration(s), there is no eschar. There is a moderate amount of slough, fibrin, and necrotic tissue noted. Granulation tissue and wound base is red. There is a minimal amount of serosanguineous drainage noted. There is no exposed bone muscle-tendon ligament or joint. There is no malodor. Periwound integrity is intact. Skin is warm, dry and supple bilateral lower extremities.  Vascular: Palpable pedal pulses bilaterally. No edema or erythema noted. Capillary refill within normal limits.  Neurological: Epicritic and protective threshold diminished bilaterally.   Musculoskeletal Exam: Range of motion within normal limits to all pedal and ankle joints bilateral. Muscle strength 5/5 in all groups bilateral.   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 #2 medically necessary excisional debridement including  subcutaneous tissue was performed using a tissue nipper and a chisel blade. Excisional debridement of all the necrotic nonviable tissue down to healthy bleeding viable tissue was performed with post-debridement measurements same as pre-. #3  Wound cleansed with normal saline #4  Collagen and aquacel Ag applied to the ulceration with dry sterile dressing.  Keep clean dry and intact x 3 days.  Collagen supplies given to the patient to apply collagen and a Band-Aid daily after the 3 days #5 continue weightbearing in the postoperative shoe. #6 return to clinic in 1 week   Edrick Kins, DPM [facility]  Dr. Edrick Kins, [address]                                        [place], [place] [address]                Office [phone]  Fax [phone]

## 2018-11-29 ENCOUNTER — Ambulatory Visit (INDEPENDENT_AMBULATORY_CARE_PROVIDER_SITE_OTHER): Payer: Medicare Other | Admitting: Podiatry

## 2018-11-29 ENCOUNTER — Other Ambulatory Visit: Payer: Self-pay

## 2018-11-29 VITALS — Temp 98.1°F

## 2018-11-29 DIAGNOSIS — E0843 Diabetes mellitus due to underlying condition with diabetic autonomic (poly)neuropathy: Secondary | ICD-10-CM

## 2018-11-29 DIAGNOSIS — L97512 Non-pressure chronic ulcer of other part of right foot with fat layer exposed: Secondary | ICD-10-CM | POA: Diagnosis not present

## 2018-11-29 DIAGNOSIS — S98131S Complete traumatic amputation of one right lesser toe, sequela: Secondary | ICD-10-CM

## 2018-11-29 NOTE — Progress Notes (Signed)
   Subjective:  63 year old male with PMHx of DM presents to the office today for follow up evaluation of an ulceration of the right foot.  Patient has been weight bearing in a postoperative shoe and changing dressings daily as directed. No new complaints at this time  Past Medical History:  Diagnosis Date  . CKD (chronic kidney disease) stage 5, GFR less than 15 ml/min (HCC)   . Diabetes mellitus without complication ([place])   . Dyspnea   . GERD (gastroesophageal reflux disease)   . Hypertension   . Lower extremity edema   . Myocardial infarction [facility])      Objective/Physical Exam General: The patient is alert and oriented x3 in no acute distress.  Dermatology:  Wound #1 noted to the right sub-first MPJ measuring 0.5 x 0.5 x 0.2 cm (LxWxD).   To the noted ulceration(s), there is no eschar. There is a moderate amount of slough, fibrin, and necrotic tissue noted. Granulation tissue and wound base is red. There is a minimal amount of serosanguineous drainage noted. There is no exposed bone muscle-tendon ligament or joint. There is no malodor. Periwound integrity is intact. Skin is warm, dry and supple bilateral lower extremities.  Vascular: Palpable pedal pulses bilaterally. No edema or erythema noted. Capillary refill within normal limits.  Neurological: Epicritic and protective threshold diminished bilaterally.   Musculoskeletal Exam: Range of motion within normal limits to all pedal and ankle joints bilateral. Muscle strength 5/5 in all groups bilateral.   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 #2 medically necessary excisional debridement including subcutaneous tissue was performed using a tissue nipper and a chisel blade. Excisional debridement of all the necrotic nonviable tissue down to healthy  bleeding viable tissue was performed with post-debridement measurements same as pre-. #3  Wound cleansed with normal saline. DSD applied.  Continue collagen dressings daily at home #4  Today diabetic shoes with Plastizote insoles were dispensed.  Break-in instructions provided.  Patient may transition from the postoperative shoe to the diabetic insoles #5 return to clinic in 3 weeks  Edrick Kins, DPM [facility]  Dr. Edrick Kins, [address] [place]                                        [place], [place] [address]                Office [phone]  Fax [phone]

## 2018-12-15 ENCOUNTER — Other Ambulatory Visit: Payer: Self-pay

## 2018-12-15 ENCOUNTER — Encounter: Payer: Self-pay | Admitting: Podiatry

## 2018-12-15 ENCOUNTER — Ambulatory Visit (INDEPENDENT_AMBULATORY_CARE_PROVIDER_SITE_OTHER): Payer: Medicare Other | Admitting: Podiatry

## 2018-12-15 ENCOUNTER — Ambulatory Visit: Payer: BLUE CROSS/BLUE SHIELD | Admitting: Podiatry

## 2018-12-15 VITALS — Temp 97.6°F

## 2018-12-15 DIAGNOSIS — E0843 Diabetes mellitus due to underlying condition with diabetic autonomic (poly)neuropathy: Secondary | ICD-10-CM

## 2018-12-15 DIAGNOSIS — E139 Other specified diabetes mellitus without complications: Secondary | ICD-10-CM | POA: Insufficient documentation

## 2018-12-15 DIAGNOSIS — Z89421 Acquired absence of other right toe(s): Secondary | ICD-10-CM

## 2018-12-15 DIAGNOSIS — L97512 Non-pressure chronic ulcer of other part of right foot with fat layer exposed: Secondary | ICD-10-CM

## 2018-12-15 NOTE — Progress Notes (Signed)
This patient returns to the office for preventative foot care services treatment.  He was seen previously in April for an ulcer by Dr. Amalia Hailey.  At that time Dr. Amalia Hailey proceeded to do nail care.  He presents the office today stating he still has a callus and has been wearing a bandage on the skin lesion under his first metatarsal right foot following an amputation of the big toe.  Patient denies any drainage from the site of the skin lesion.  This patient is a diabetic and has received diabetic shoes from [facility] to help off weight-bear this lesion.  He presents the office today for continued evaluation of this skin lesion right foot.  He presents to the office wearing a bandage and saying he has been soaking his foot and applying bandage given to him by Dr.  Amalia Hailey.  General Appearance  Alert, conversant and in no acute stress.  Vascular  Dorsalis pedis and posterior tibial  pulses are palpable  bilaterally.  Capillary return is within normal limits  bilaterally. Temperature is within normal limits  bilaterally.  Neurologic  Senn-Weinstein monofilament wire test diminished   bilaterally. Muscle power within normal limits bilaterally.  Nails Thick disfigured discolored nails with subungual debris  from hallux to fifth toes bilaterally. No evidence of bacterial infection or drainage bilaterally.  Orthopedic  No limitations of motion  feet .  No crepitus or effusions noted.  No bony pathology or digital deformities noted.  Skin  normotropic skin with no porokeratosis noted bilaterally.  There is an open skin lesion noted measuring approximately 0.5 mm x 5 mm.  No evidence of any redness swelling or drainage from the site of the central area of the skin lesion.  There is callus noted around the outside of the central skin lesion.   Diabetic ulcer right secondary toe amputation.  ROV.  Examination of skin lesion was performed.  DSD applied to skin lesion.  Padding applied to diabetic insole right foot to  increase  Off weight bearing skin lesion.  RTC 6 weeks.   Gardiner Barefoot DPM

## 2018-12-20 ENCOUNTER — Ambulatory Visit: Payer: Medicare Other | Admitting: Podiatry

## 2019-01-11 ENCOUNTER — Telehealth: Payer: Self-pay | Admitting: Podiatry

## 2019-01-11 NOTE — Telephone Encounter (Signed)
Pt's wife called and stated pt's wound has a slight odor to it. Wants to know if its okay if he takes the rest of his antibiotic from the first time as he did not take them all. Wanted to know what else they should do. Pt is scheduled to see Dr. Amalia Hailey Friday and I did offer them to come in tomorrow to see Dr. Prudence Davidson but pt requested to see Dr. Amalia Hailey.

## 2019-01-11 NOTE — Telephone Encounter (Signed)
Returned call to patient,  Spoke with wife, she stated that he got his foot wet and when she went to take the bandage off, she said there was a slight odor, no redness. I informed her to clean with soap and water, and cover with Betadine and gauze.  Educated on s/s of infection and to keep appt with Dr. Amalia Hailey on Friday.  She verbalized understanading

## 2019-01-13 ENCOUNTER — Other Ambulatory Visit: Payer: Self-pay

## 2019-01-13 ENCOUNTER — Encounter: Payer: Self-pay | Admitting: Podiatry

## 2019-01-13 ENCOUNTER — Ambulatory Visit (INDEPENDENT_AMBULATORY_CARE_PROVIDER_SITE_OTHER): Payer: Medicare Other

## 2019-01-13 ENCOUNTER — Ambulatory Visit (INDEPENDENT_AMBULATORY_CARE_PROVIDER_SITE_OTHER): Payer: Medicare Other | Admitting: Podiatry

## 2019-01-13 VITALS — Temp 97.5°F

## 2019-01-13 DIAGNOSIS — L97512 Non-pressure chronic ulcer of other part of right foot with fat layer exposed: Secondary | ICD-10-CM | POA: Diagnosis not present

## 2019-01-13 DIAGNOSIS — E0843 Diabetes mellitus due to underlying condition with diabetic autonomic (poly)neuropathy: Secondary | ICD-10-CM

## 2019-01-13 MED ORDER — SULFAMETHOXAZOLE-TRIMETHOPRIM 800-160 MG PO TABS: 1.0000 | ORAL_TABLET | Freq: Two times a day (BID) | ORAL | 1 refills | Status: DC

## 2019-01-16 LAB — WOUND CULTURE

## 2019-01-16 NOTE — Progress Notes (Signed)
   Subjective:  63 y.o. male with PMHx of diabetes mellitus presenting today for follow up evaluation of an ulceration of the right foot secondary to diabetes mellitus. He expresses concern for infection noted mildly malodorous drainage. He has been soaking the foot and applying a dry sterile dressing for treatment. He denies modifying factors. He denies redness. Patient is here for further evaluation and treatment.    Past Medical History:  Diagnosis Date  . CKD (chronic kidney disease) stage 5, GFR less than 15 ml/min (HCC)   . Diabetes mellitus without complication ([place])   . Dyspnea   . GERD (gastroesophageal reflux disease)   . Hypertension   . Lower extremity edema   . Myocardial infarction [facility])       Objective/Physical Exam General: The patient is alert and oriented x3 in no acute distress.  Dermatology:  Wound #1 noted to the right foot measuring 1.0 x 1.0 x 0.6 cm (LxWxD).   To the noted ulceration(s), there is no eschar. There is a moderate amount of slough, fibrin, and necrotic tissue noted. Granulation tissue and wound base is red. There is a minimal amount of serosanguineous drainage noted. There is no exposed bone muscle-tendon ligament or joint. There is no malodor. Periwound integrity is intact. Skin is warm, dry and supple bilateral lower extremities.  Vascular: Palpable pedal pulses bilaterally. No edema or erythema noted. Capillary refill within normal limits.  Neurological: Epicritic and protective threshold diminished bilaterally.   Musculoskeletal Exam: Range of motion within normal limits to all pedal and ankle joints bilateral. Muscle strength 5/5 in all groups bilateral.   Radiographic Exam:  No evidence of osteomyelitis. Bone spurring noted to the 1st metatarsal which is likely contributing to ulceration.     Assessment: 1. Ulceration of the right foot secondary to diabetes mellitus 2. diabetes mellitus w/ peripheral neuropathy   Plan of Care:  1.  Patient was evaluated. 2. Medically necessary excisional debridement including muscle and deep fascial tissue was performed using a tissue nipper and a chisel blade. Excisional debridement of all the necrotic nonviable tissue down to healthy bleeding viable tissue was performed with post-debridement measurements same as pre-. 3. The wound was cleansed and dry sterile dressing applied. 4. Recommended using Betadine daily with a dry sterile dressing.  5. Continue wearing DM shoes.  6. Culture was taken from wound.  7. Prescription for Bactrim DS provided to patient.  8. Return to clinic in 3 weeks. At some point, we may need to consider surgical intervention to shave off bone spurs.     Edrick Kins, DPM [facility]  Dr. Edrick Kins, [place]                                        [place], [place] [address]                Office [phone]  Fax [phone]

## 2019-01-17 LAB — WOUND CULTURE

## 2019-01-31 DIAGNOSIS — R1084 Generalized abdominal pain: Secondary | ICD-10-CM | POA: Insufficient documentation

## 2019-02-03 ENCOUNTER — Encounter: Payer: Self-pay | Admitting: Podiatry

## 2019-02-03 ENCOUNTER — Other Ambulatory Visit: Payer: Self-pay

## 2019-02-03 ENCOUNTER — Ambulatory Visit (INDEPENDENT_AMBULATORY_CARE_PROVIDER_SITE_OTHER): Payer: Medicare Other | Admitting: Podiatry

## 2019-02-03 VITALS — Temp 98.0°F

## 2019-02-03 DIAGNOSIS — Z89421 Acquired absence of other right toe(s): Secondary | ICD-10-CM

## 2019-02-03 DIAGNOSIS — M7751 Other enthesopathy of right foot: Secondary | ICD-10-CM

## 2019-02-03 DIAGNOSIS — L97512 Non-pressure chronic ulcer of other part of right foot with fat layer exposed: Secondary | ICD-10-CM | POA: Diagnosis not present

## 2019-02-03 DIAGNOSIS — E0843 Diabetes mellitus due to underlying condition with diabetic autonomic (poly)neuropathy: Secondary | ICD-10-CM

## 2019-02-03 NOTE — Patient Instructions (Signed)
Pre-Operative Instructions  Congratulations, you have decided to take an important step towards improving your quality of life.  You can be assured that the doctors and staff at [facility] will be with you every step of the way.  Here are some important things you should know:  1. Plan to be at the surgery center/hospital at least 1 (one) hour prior to your scheduled time, unless otherwise directed by the surgical center/hospital staff.  You must have a responsible adult accompany you, remain during the surgery and drive you home.  Make sure you have directions to the surgical center/hospital to ensure you arrive on time. 2. If you are having surgery at [facility] or [facility] hospitals, you will need a copy of your medical history and physical form from your family physician within one month prior to the date of surgery. We will give you a form for your primary physician to complete.  3. We make every effort to accommodate the date you request for surgery.  However, there are times where surgery dates or times have to be moved.  We will contact you as soon as possible if a change in schedule is required.   4. No aspirin/ibuprofen for one week before surgery.  If you are on aspirin, any non-steroidal anti-inflammatory medications (Mobic, Aleve, Ibuprofen) should not be taken seven (7) days prior to your surgery.  You make take Tylenol for pain prior to surgery.  5. Medications - If you are taking daily heart and blood pressure medications, seizure, reflux, allergy, asthma, anxiety, pain or diabetes medications, make sure you notify the surgery center/hospital before the day of surgery so they can tell you which medications you should take or avoid the day of surgery. 6. No food or drink after midnight the night before surgery unless directed otherwise by surgical center/hospital staff. 7. No alcoholic beverages 24-hours prior to surgery.  No smoking 24-hours prior or 24-hours after  surgery. 8. Wear loose pants or shorts. They should be loose enough to fit over bandages, boots, and casts. 9. Don't wear slip-on shoes. Sneakers are preferred. 10. Bring your boot with you to the surgery center/hospital.  Also bring crutches or a walker if your physician has prescribed it for you.  If you do not have this equipment, it will be provided for you after surgery. 11. If you have not been contacted by the surgery center/hospital by the day before your surgery, call to confirm the date and time of your surgery. 12. Leave-time from work may vary depending on the type of surgery you have.  Appropriate arrangements should be made prior to surgery with your employer. 13. Prescriptions will be provided immediately following surgery by your doctor.  Fill these as soon as possible after surgery and take the medication as directed. Pain medications will not be refilled on weekends and must be approved by the doctor. 14. Remove nail polish on the operative foot and avoid getting pedicures prior to surgery. 15. Wash the night before surgery.  The night before surgery wash the foot and leg well with water and the antibacterial soap provided. Be sure to pay special attention to beneath the toenails and in between the toes.  Wash for at least three (3) minutes. Rinse thoroughly with water and dry well with a towel.  Perform this wash unless told not to do so by your physician.  Enclosed: 1 Ice pack (please put in freezer the night before surgery)   1 Hibiclens skin cleaner     Pre-op instructions  If you have any questions regarding the instructions, please do not hesitate to call our office.  [place]: [address], [place], [place] [address] -- [phone]  [place]: [address] [place], [place] [address] -- [phone]  [place]: [address]  [place], [place] [address] -- [phone]  [place]: [address], [address], [place], [place] [address] -- [phone]  Website:  [email or web address] 

## 2019-02-07 ENCOUNTER — Telehealth: Payer: Self-pay | Admitting: *Deleted

## 2019-02-07 NOTE — Progress Notes (Signed)
   Subjective:  63 y.o. male with PMHx of diabetes mellitus presenting today for follow up evaluation of an ulceration of the right foot secondary to diabetes mellitus. He states he is doing well overall but is worried the wound is not healing appropriately. He has been using Betadine as directed with no significant improvement. There are no modifying factors noted. Patient is here for further evaluation and treatment.    Past Medical History:  Diagnosis Date  . CKD (chronic kidney disease) stage 5, GFR less than 15 ml/min (HCC)   . Diabetes mellitus without complication ([place])   . Dyspnea   . GERD (gastroesophageal reflux disease)   . Hypertension   . Lower extremity edema   . Myocardial infarction [facility])       Objective/Physical Exam General: The patient is alert and oriented x3 in no acute distress.  Dermatology:  Wound #1 noted to the right foot measuring 1.0 x 1.0 x 0.6 cm (LxWxD).   To the noted ulceration(s), there is no eschar. There is a moderate amount of slough, fibrin, and necrotic tissue noted. Granulation tissue and wound base is red. There is a minimal amount of serosanguineous drainage noted. There is no exposed bone muscle-tendon ligament or joint. There is no malodor. Periwound integrity is intact. Skin is warm, dry and supple bilateral lower extremities.  Vascular: Palpable pedal pulses bilaterally. No edema or erythema noted. Capillary refill within normal limits.  Neurological: Epicritic and protective threshold diminished bilaterally.   Musculoskeletal Exam: Range of motion within normal limits to all pedal and ankle joints bilateral. Muscle strength 5/5 in all groups bilateral.   Assessment: 1. Ulceration of the right foot secondary to diabetes mellitus 2. diabetes mellitus w/ peripheral neuropathy   Plan of Care:  1. Patient was evaluated. 2. Medically necessary excisional debridement including subcutaneous tissue was performed using a tissue nipper and a  chisel blade. Excisional debridement of all the necrotic nonviable tissue down to healthy bleeding viable tissue was performed with post-debridement measurements same as pre-. 3. The wound was cleansed and dry sterile dressing applied. 4. Continue using Betadine daily.  5. Continue wearing DM shoes.  6. Today we discussed the conservative versus surgical management of the presenting pathology. The patient opts for surgical management. All possible complications and details of the procedure were explained. All patient questions were answered. No guarantees were expressed or implied. 7. Authorization for surgery was initiated today. Surgery will consist of 1st metatarsal exostectomy with debridement of underlying ulceration right foot.  8. Return to clinic one week post op.    Edrick Kins, DPM [facility]  Dr. Edrick Kins, [place]                                        [place], [place] [address]                Office [phone]  Fax [phone]

## 2019-02-07 NOTE — Telephone Encounter (Signed)
"  I'm calling to schedule an appointment for my husband, Donald Silva."  Dr. Amalia Hailey does surgeries on Thursday.  Do you have a date that you would like?  "I'd like to do it for July 16, I'm going out of town from the 11th thru the 13th.  I'll be the one taking care of him."  Dr. Amalia Hailey does not have anything available on 02/23/2019 but he can do it on 03/02/2019.  "Okay, that date will be fine.  What time?"  Someone from the surgical center will give him a call a day or two prior to his surgery date and will give him his arrival time.  He does need to go online and register with the surgical center.  The instructions are in the brochure that we gave him.  "Okay, I'll take care of that."

## 2019-02-09 ENCOUNTER — Ambulatory Visit: Payer: Medicare Other | Admitting: Podiatry

## 2019-02-24 ENCOUNTER — Telehealth: Payer: Self-pay | Admitting: *Deleted

## 2019-02-24 NOTE — Telephone Encounter (Signed)
DOS 03/02/2019; 01222 - EXOSTECTOMY 1ST MET. AND 41146 - DEBRIDEMENT ULCER RIGHT FOOT  BCBS: Effective Date - 11/09/2018 - 08/09/9998   In-Network    Max Per Benefit Period Year-to-Date Remaining  CoInsurance  20%   Deductible  $300.00 $0.00  Out-Of-Pocket  $3500.00 $0.00   AMBULATORY SURGERY  In Network  Copay Coinsurance  Not Applicable  43% per [place]

## 2019-03-02 ENCOUNTER — Ambulatory Visit (INDEPENDENT_AMBULATORY_CARE_PROVIDER_SITE_OTHER): Payer: Self-pay | Admitting: Podiatry

## 2019-03-02 ENCOUNTER — Other Ambulatory Visit: Payer: Medicare Other

## 2019-03-02 ENCOUNTER — Telehealth: Payer: Self-pay

## 2019-03-02 ENCOUNTER — Encounter: Payer: Self-pay | Admitting: Podiatry

## 2019-03-02 ENCOUNTER — Other Ambulatory Visit: Payer: Self-pay

## 2019-03-02 ENCOUNTER — Other Ambulatory Visit: Payer: Self-pay | Admitting: Podiatry

## 2019-03-02 DIAGNOSIS — Z09 Encounter for follow-up examination after completed treatment for conditions other than malignant neoplasm: Secondary | ICD-10-CM

## 2019-03-02 DIAGNOSIS — D1631 Benign neoplasm of short bones of right lower limb: Secondary | ICD-10-CM

## 2019-03-02 DIAGNOSIS — L97512 Non-pressure chronic ulcer of other part of right foot with fat layer exposed: Secondary | ICD-10-CM

## 2019-03-02 MED ORDER — OXYCODONE-ACETAMINOPHEN 5-325 MG PO TABS: 1.0000 | ORAL_TABLET | Freq: Four times a day (QID) | ORAL | 0 refills | Status: DC | PRN

## 2019-03-02 NOTE — Progress Notes (Signed)
.  postop

## 2019-03-02 NOTE — Progress Notes (Signed)
This patient presents to the office stating that he had surgery this morning by Dr. Amalia Hailey and his bandage has bled through.  Patient had a first metatarsal exostectomy and debridement of an ulcer right foot.  He presents the office with excessive bleeding from the surgical site on his right foot.  He presents the office here at [facility] for an evaluation and treatment.  Patient has been experiencing an open ulcer under the first MPJ right foot for months.  Neurovascular status is intact for this patient.  Severe bleeding is noted on the bandage that was applied at the surgical center this morning.  Post-op Visit  ROV.  Examination of the bandage reveals that there is severe bleeding noted from the surgical site right foot.  The bloody gauze pad was removed from the right foot and a new dry bandage was reapplied to his right foot.  Patient was instructed to keep his first postoperative visit with Dr. Amalia Hailey as scheduled.  Gardiner Barefoot DPM

## 2019-03-02 NOTE — Telephone Encounter (Signed)
[name] called stating patient had procedure done today and was bleeding through bandages really bad and was on the way to [facility] to be seen.  Called back, spoke to patient and he informed me that he was taken care of already at the [place] office and doing better.

## 2019-03-07 ENCOUNTER — Encounter: Payer: Self-pay | Admitting: Podiatry

## 2019-03-07 ENCOUNTER — Telehealth: Payer: Self-pay

## 2019-03-07 NOTE — Telephone Encounter (Signed)
Called pt post surgery, had to go to [place] office after surgery to have bandages changed due to excessive bleeding; "doing better, no excessive pain, but had to change bandage again last night, no new bleeding but had dried up blood on bandages so wife changed it"; Confirmed appt for tomorrow, informed that bandages would be changed again.

## 2019-03-08 ENCOUNTER — Other Ambulatory Visit: Payer: Self-pay

## 2019-03-08 ENCOUNTER — Ambulatory Visit (INDEPENDENT_AMBULATORY_CARE_PROVIDER_SITE_OTHER): Payer: Medicare Other

## 2019-03-08 ENCOUNTER — Ambulatory Visit (INDEPENDENT_AMBULATORY_CARE_PROVIDER_SITE_OTHER): Payer: Medicare Other | Admitting: Podiatry

## 2019-03-08 ENCOUNTER — Encounter: Payer: Self-pay | Admitting: Podiatry

## 2019-03-08 VITALS — Temp 97.6°F

## 2019-03-08 DIAGNOSIS — Z09 Encounter for follow-up examination after completed treatment for conditions other than malignant neoplasm: Secondary | ICD-10-CM

## 2019-03-08 DIAGNOSIS — L97512 Non-pressure chronic ulcer of other part of right foot with fat layer exposed: Secondary | ICD-10-CM

## 2019-03-08 NOTE — Progress Notes (Signed)
He presents today date of surgery 03/02/2019 with a ray resection partial first met.  States that the wound is been draining and actively bleeding and he had to change the dressing yesterday.  He denies fever chills nausea vomiting muscle aches and pains.  States that he has been on his foot a lot and with questions that he is asking about driving I would assume that he has been driving.  Objective: Presents in a cam walker dressing removed demonstrates blood that appears to be fresh and not coagulated on the dressing itself.  Staples are intact margins are coapting slight area of maceration more than likely due to being outside and sweating.  Assessment: Well-healing surgical foot not actively bleeding.  Plan: Placed a dry sterile compression dressing on the right foot and encouraged him to leave this in position and less completely soaked through with blood he is not to drive he is not be out walking around in his yard he is to stay off of this foot and keep it elevated in hopes that he will not lose his leg.

## 2019-03-11 ENCOUNTER — Other Ambulatory Visit: Payer: Self-pay | Admitting: Podiatry

## 2019-03-11 MED ORDER — DOXYCYCLINE HYCLATE 100 MG PO TABS: 100.0000 mg | ORAL_TABLET | Freq: Two times a day (BID) | ORAL | 0 refills | Status: DC

## 2019-03-11 NOTE — Progress Notes (Signed)
Patient and his wife called stating that he took off the outer bandage today and noticed a smell. His wife took the entire dressing down except for the gauze right on the incision. There is dried blood on the gauze. I had her remove this. He does not think that the incision is open. There is apparently no surrounding erythema or red streaking. No pus. I had her apply a small amount of betadine to the wound and re-dress the foot with gauze and a wrap. Encouraged elevation and him staying off of his foot. As a precaution I prescribed doxycycline. He states that on Thursday when he came to the office he was told the foot looked "moist". If there is any worsening or any signs of infection or active bleeding he needs to go to the ER. He denies any fevers, chills, nausea, vomiting or any other symptoms. We will have him come to the office on Monday but encouraged to call back with any changes or concerns.   Trula Calaway

## 2019-03-13 ENCOUNTER — Other Ambulatory Visit: Payer: Self-pay

## 2019-03-13 ENCOUNTER — Encounter: Payer: Self-pay | Admitting: Podiatry

## 2019-03-13 ENCOUNTER — Ambulatory Visit (INDEPENDENT_AMBULATORY_CARE_PROVIDER_SITE_OTHER): Payer: Medicare Other | Admitting: Podiatry

## 2019-03-13 VITALS — Temp 97.7°F

## 2019-03-13 DIAGNOSIS — Z9889 Other specified postprocedural states: Secondary | ICD-10-CM

## 2019-03-13 DIAGNOSIS — L97512 Non-pressure chronic ulcer of other part of right foot with fat layer exposed: Secondary | ICD-10-CM

## 2019-03-15 NOTE — Progress Notes (Signed)
   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 DOS: 03/02/2019. He states the wound has opened and is malodorous. He states he talked to the physician on call two days ago and was told to unwrap the dressing, apply Iodine and rewrap which he did. There are no modifying factors noted. Patient is here for further evaluation and treatment.    Past Medical History:  Diagnosis Date  . CKD (chronic kidney disease) stage 5, GFR less than 15 ml/min (HCC)   . Diabetes mellitus without complication ([place])   . Dyspnea   . GERD (gastroesophageal reflux disease)   . Hypertension   . Lower extremity edema   . Myocardial infarction [facility])       Objective/Physical Exam Neurovascular status intact. Dehiscence noted to central incision with moderate drainage. No active bleeding noted. Moderate edema noted to the surgical extremity.  Assessment: 1. 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 DOS: 03/02/2019 2. Post op infection right    Plan of Care:  1. Patient was evaluated. 2. Culture taken from wound.  3. Recommended Betadine daily with a dry sterile dressing.  4. CAM boot dispensed.  5. Continue taking oral Doxycycline twice daily.  6. Return to clinic in one week.    Edrick Kins, DPM [facility]  Dr. Edrick Kins, [address]                                        [place], [place] [address]                Office [phone]  Fax [phone]

## 2019-03-16 LAB — WOUND CULTURE
MICRO NUMBER:: 732333
SPECIMEN QUALITY:: ADEQUATE

## 2019-03-17 ENCOUNTER — Encounter: Payer: Self-pay | Admitting: Podiatry

## 2019-03-17 ENCOUNTER — Ambulatory Visit (INDEPENDENT_AMBULATORY_CARE_PROVIDER_SITE_OTHER): Payer: Self-pay | Admitting: Podiatry

## 2019-03-17 ENCOUNTER — Other Ambulatory Visit: Payer: Self-pay

## 2019-03-17 VITALS — Temp 97.9°F

## 2019-03-17 DIAGNOSIS — L97512 Non-pressure chronic ulcer of other part of right foot with fat layer exposed: Secondary | ICD-10-CM

## 2019-03-17 DIAGNOSIS — Z89421 Acquired absence of other right toe(s): Secondary | ICD-10-CM

## 2019-03-17 DIAGNOSIS — Z9889 Other specified postprocedural states: Secondary | ICD-10-CM

## 2019-03-19 NOTE — Progress Notes (Signed)
   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 DOS: 03/02/2019. He is concerned about his incision opening in the center. He reports associated malodorous drainage. He reports taking his Doxycycline as directed. He denies any pain or modifying factors. Patient is here for further evaluation and treatment.   Past Medical History:  Diagnosis Date  . CKD (chronic kidney disease) stage 5, GFR less than 15 ml/min (HCC)   . Diabetes mellitus without complication ([place])   . Dyspnea   . GERD (gastroesophageal reflux disease)   . Hypertension   . Lower extremity edema   . Myocardial infarction [facility])       Objective/Physical Exam Neurovascular status intact. Dehiscence / ulceration measuring 2.5 x 1.5 x 1.0 cm noted to central incision with moderate drainage. No active bleeding noted. Moderate edema noted to the surgical extremity.  Assessment: 1. 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 DOS: 03/02/2019 2. Post op infection right - resolved  3. Dehiscence / ulceration noted to the surgical incision site   Plan of Care:  1. Patient was evaluated. 2. Medically necessary excisional debridement including muscle and deep fascial tissue was performed using a tissue nipper and a chisel blade. Excisional debridement of all the necrotic nonviable tissue down to healthy bleeding viable tissue was performed with post-debridement measurements same as pre-. 3. The wound was cleansed and dry sterile dressing applied. 4. Aquacel Ag supplies provided. Order placed to have dressing supplies delivered to house.  5. Continue weightbearing in CAM boot.  6. Return to clinic in 2 weeks.    [place], DPM [facility]  Dr. Edrick Silva, [place]                                        [place], [place] [address]                Office [phone]  Fax [phone]

## 2019-03-20 ENCOUNTER — Telehealth: Payer: Self-pay

## 2019-03-20 NOTE — Telephone Encounter (Signed)
Referral has been sent to [facility] for wound care supplies

## 2019-03-20 NOTE — Telephone Encounter (Signed)
-----   Message from Edrick Kins, DPM sent at 03/17/2019 10:05 AM EDT ----- Regarding: Home dressing supplies Please order Aquacel Ag 2 in. X 2 in.   Sig: dressing changes daily x 6 weeks  Dx: ulcer RT foot secondary to diabetes mellitus  Dr. Amalia Hailey

## 2019-03-31 ENCOUNTER — Other Ambulatory Visit: Payer: Self-pay

## 2019-03-31 ENCOUNTER — Ambulatory Visit (INDEPENDENT_AMBULATORY_CARE_PROVIDER_SITE_OTHER): Payer: Medicare Other | Admitting: Podiatry

## 2019-03-31 ENCOUNTER — Ambulatory Visit (INDEPENDENT_AMBULATORY_CARE_PROVIDER_SITE_OTHER): Payer: Medicare Other

## 2019-03-31 DIAGNOSIS — Z9889 Other specified postprocedural states: Secondary | ICD-10-CM

## 2019-03-31 DIAGNOSIS — L97512 Non-pressure chronic ulcer of other part of right foot with fat layer exposed: Secondary | ICD-10-CM

## 2019-03-31 DIAGNOSIS — Z89421 Acquired absence of other right toe(s): Secondary | ICD-10-CM | POA: Diagnosis not present

## 2019-03-31 DIAGNOSIS — D1631 Benign neoplasm of short bones of right lower limb: Secondary | ICD-10-CM | POA: Diagnosis not present

## 2019-04-02 NOTE — Progress Notes (Signed)
   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 DOS: 03/02/2019. He states he is about the same as he was at his last visit. He denies any significant pain or modifying factors. He has been using the post op shoe as directed. Patient is here for further evaluation and treatment.   Past Medical History:  Diagnosis Date  . CKD (chronic kidney disease) stage 5, GFR less than 15 ml/min (HCC)   . Diabetes mellitus without complication ([place])   . Dyspnea   . GERD (gastroesophageal reflux disease)   . Hypertension   . Lower extremity edema   . Myocardial infarction [facility])       Objective/Physical Exam Neurovascular status intact. Dehiscence / ulceration measuring 3.0 x 1.5 x 2.0 cm noted to central incision with moderate drainage. No active bleeding noted. Moderate edema noted to the surgical extremity.  Radiographic Exam:  Orthopedic hardware and osteotomies sites appear to be stable with routine healing.  Assessment: 1. 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 DOS: 03/02/2019 2. Post op infection right - resolved  3. Dehiscence / ulceration noted to the surgical incision site   Plan of Care:  1. Patient was evaluated. X-Rays reviewed.  2. Staples removed.  3. Dry sterile dressing applied. Keep clean, dry and intact for one week.  4. Continue using post op shoe.  5. Return to clinic in one week.     Edrick Kins, DPM [facility]  Dr. Edrick Kins, [address]                                        [place], [place] [address]                Office [phone]  Fax [phone]

## 2019-04-05 ENCOUNTER — Telehealth: Payer: Self-pay | Admitting: *Deleted

## 2019-04-05 MED ORDER — DOXYCYCLINE HYCLATE 100 MG PO TABS: 100.0000 mg | ORAL_TABLET | Freq: Two times a day (BID) | ORAL | 0 refills | Status: DC

## 2019-04-05 NOTE — Telephone Encounter (Signed)
I informed pt's wife, [place] Silva of Dr. Amalia Hailey' orders and to go to the ED if worsened, fever, chills, sweats or red darkening streaks from the foot. Donald [place] states she will continue to pain the white areas with the iodine and dress.

## 2019-04-05 NOTE — Telephone Encounter (Signed)
Left message informing pt I would send her request to Dr. Amalia Hailey, she should call to get pt in to be seen in [place] on Friday

## 2019-04-05 NOTE — Telephone Encounter (Signed)
Dr. Amalia Hailey ordered refill doxycycline originally ordered 03/11/2019 and make sure pt keeps appt 04/07/2019.

## 2019-04-05 NOTE — Telephone Encounter (Signed)
Pt's wife, states pt's surgical foot is smelling.

## 2019-04-07 ENCOUNTER — Encounter: Payer: Self-pay | Admitting: Podiatry

## 2019-04-07 ENCOUNTER — Ambulatory Visit (INDEPENDENT_AMBULATORY_CARE_PROVIDER_SITE_OTHER): Payer: Medicare Other | Admitting: Podiatry

## 2019-04-07 ENCOUNTER — Other Ambulatory Visit: Payer: Self-pay

## 2019-04-07 DIAGNOSIS — L97512 Non-pressure chronic ulcer of other part of right foot with fat layer exposed: Secondary | ICD-10-CM

## 2019-04-07 DIAGNOSIS — Z89421 Acquired absence of other right toe(s): Secondary | ICD-10-CM

## 2019-04-07 DIAGNOSIS — Z9889 Other specified postprocedural states: Secondary | ICD-10-CM

## 2019-04-07 DIAGNOSIS — E0843 Diabetes mellitus due to underlying condition with diabetic autonomic (poly)neuropathy: Secondary | ICD-10-CM

## 2019-04-10 NOTE — Progress Notes (Signed)
   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 DOS: 03/02/2019. He states he is doing well overall. He reports the wound has gotten slightly smaller than it was at his previous visit. He has been using the post op shoe as directed. He denies any modifying factors at this time. Patient is here for further evaluation and treatment.   Past Medical History:  Diagnosis Date  . CKD (chronic kidney disease) stage 5, GFR less than 15 ml/min (HCC)   . Diabetes mellitus without complication ([place])   . Dyspnea   . GERD (gastroesophageal reflux disease)   . Hypertension   . Lower extremity edema   . Myocardial infarction [facility])       Objective/Physical Exam Neurovascular status intact. Dehiscence / ulceration measuring 2.5 x 1.0 x 2.0 cm noted to central incision with moderate drainage. No active bleeding noted. Moderate edema noted to the surgical extremity.  Assessment: 1. 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 DOS: 03/02/2019 2. Post op infection right - resolved  3. Dehiscence / ulceration noted to the surgical incision site   Plan of Care:  1. Patient was evaluated.  2. Medically necessary excisional debridement including muscle and deep fascial tissue was performed using a tissue nipper and a chisel blade. Excisional debridement of all the necrotic nonviable tissue down to healthy bleeding viable tissue was performed with post-debridement measurements same as pre-. 3. The wound was cleansed and dry sterile dressing applied. 4. Recommended Aquacel Ag every other day with a dry sterile dressing.  5. Continue using post op shoe.  6. Patient scheduled for arterial studies.  7. Culture taken from wound. Continue taking oral Doxycycline as prescribed.  8. Return to clinic in 2 weeks.     Edrick Kins, DPM [facility]  Dr. Edrick Kins, [address]                                        [place], [place] [address]                 Office [phone]  Fax [phone]

## 2019-04-12 LAB — WOUND CULTURE

## 2019-04-21 ENCOUNTER — Other Ambulatory Visit: Payer: Self-pay

## 2019-04-21 ENCOUNTER — Ambulatory Visit (INDEPENDENT_AMBULATORY_CARE_PROVIDER_SITE_OTHER): Payer: Medicare Other | Admitting: Podiatry

## 2019-04-21 ENCOUNTER — Encounter: Payer: Self-pay | Admitting: Podiatry

## 2019-04-21 DIAGNOSIS — Z89421 Acquired absence of other right toe(s): Secondary | ICD-10-CM | POA: Diagnosis not present

## 2019-04-21 DIAGNOSIS — L97512 Non-pressure chronic ulcer of other part of right foot with fat layer exposed: Secondary | ICD-10-CM

## 2019-04-21 DIAGNOSIS — E0843 Diabetes mellitus due to underlying condition with diabetic autonomic (poly)neuropathy: Secondary | ICD-10-CM | POA: Diagnosis not present

## 2019-04-21 MED ORDER — CLINDAMYCIN HCL 300 MG PO CAPS: 300.0000 mg | ORAL_CAPSULE | Freq: Three times a day (TID) | ORAL | 0 refills | Status: DC

## 2019-04-21 NOTE — Progress Notes (Signed)
   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 DOS: 03/02/2019.  Patient says that the ulceration and wound has improved since last visit.  He has been applying the Aquacel Ag every other day with dry sterile dressings.  He continues to use the postoperative shoe.  He is scheduled for arterial studies this month.  He is also been taking oral doxycycline as prescribed.  He presents today for follow-up evaluation regarding the cultures that were taken last visit as well as management of the wound.   Past Medical History:  Diagnosis Date  . CKD (chronic kidney disease) stage 5, GFR less than 15 ml/min (HCC)   . Diabetes mellitus without complication ([place])   . Dyspnea   . GERD (gastroesophageal reflux disease)   . Hypertension   . Lower extremity edema   . Myocardial infarction [facility])       Objective/Physical Exam Neurovascular status intact. Dehiscence / ulceration measuring 2.51.0 x 2.5 cm noted to central incision with moderate drainage. No active bleeding noted. Moderate edema noted to the surgical extremity.  Assessment: 1. 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 DOS: 03/02/2019 2. Post op infection right; MRSA positive 3. Dehiscence / ulceration noted to the surgical incision site   Plan of Care:  1. Patient was evaluated.  Cultures that were taken last visit were reviewed today 2.  Today were going to change the patient's antibiotic based on cultures and sensitivities to clindamycin 300 mg 3 times daily 3.  Medically necessary excisional debridement including muscle and deep fascial tissue was performed using a curettage.  Excisional debridement of all the necrotic nonviable tissue down to healthy bleeding viable tissue was performed with post debridement measurement same as pre-. 4.  Patient has arterial studies this month to rule out any vascular etiology. 5.  Return to clinic in 2 weeks  Edrick Kins, DPM [facility]  Dr.  Edrick Kins, [address] [place]                                        [place], [place] [address]                Office [phone]  Fax [phone]

## 2019-05-04 LAB — ANAEROBIC AND AEROBIC CULTURE
MICRO NUMBER:: 699389
MICRO NUMBER:: 699390
SPECIMEN QUALITY:: ADEQUATE
SPECIMEN QUALITY:: ADEQUATE

## 2019-05-04 LAB — HOUSE ACCOUNT TRACKING

## 2019-05-05 ENCOUNTER — Ambulatory Visit (INDEPENDENT_AMBULATORY_CARE_PROVIDER_SITE_OTHER): Payer: Medicare Other | Admitting: Podiatry

## 2019-05-05 ENCOUNTER — Other Ambulatory Visit: Payer: Self-pay

## 2019-05-05 DIAGNOSIS — N185 Chronic kidney disease, stage 5: Secondary | ICD-10-CM

## 2019-05-05 DIAGNOSIS — Z89421 Acquired absence of other right toe(s): Secondary | ICD-10-CM

## 2019-05-05 DIAGNOSIS — E0843 Diabetes mellitus due to underlying condition with diabetic autonomic (poly)neuropathy: Secondary | ICD-10-CM | POA: Diagnosis not present

## 2019-05-05 DIAGNOSIS — L97512 Non-pressure chronic ulcer of other part of right foot with fat layer exposed: Secondary | ICD-10-CM | POA: Diagnosis not present

## 2019-05-05 MED ORDER — CLINDAMYCIN HCL 300 MG PO CAPS: 300.0000 mg | ORAL_CAPSULE | Freq: Three times a day (TID) | ORAL | 0 refills | Status: DC

## 2019-05-07 NOTE — Progress Notes (Signed)
   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 DOS: 03/02/2019. He states he is doing well and improving significantly. He states the wound seems to be closing. He has been using the Aquacel dressing and the post op shoe as directed. He has been taking Clindamycin three times daily as directed which he reports is helping. He denies any worsening factors. Patient is here for further evaluation and treatment.   Past Medical History:  Diagnosis Date  . CKD (chronic kidney disease) stage 5, GFR less than 15 ml/min (HCC)   . Diabetes mellitus without complication ([place])   . Dyspnea   . GERD (gastroesophageal reflux disease)   . Hypertension   . Lower extremity edema   . Myocardial infarction [facility])       Objective/Physical Exam Neurovascular status intact. Dehiscence / ulceration measuring 2.3  1.3 x 2.0 cm noted to central incision with moderate drainage. No active bleeding noted. Moderate edema noted to the surgical extremity.  Assessment: 1. 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 DOS: 03/02/2019 2. Post op infection right; MRSA positive 3. Dehiscence / ulceration noted to the surgical incision site   Plan of Care:  1. Patient was evaluated.   2. Medically necessary excisional debridement including muscle and deep fascial tissue was performed using a tissue nipper and a chisel blade. Excisional debridement of all the necrotic nonviable tissue down to healthy bleeding viable tissue was performed with post-debridement measurements same as pre-. 3. The wound was cleansed and dry sterile dressing applied. 4. Continue using Aquacel daily.  5. Continue using post op shoe.  6. Continue taking Clindamycin 300 mg TID as directed.  7. Return to clinic in 3 weeks.    Edrick Kins, DPM [facility]  Dr. Edrick Kins, [address]                                        [place], [place] [address]                Office [phone]  Fax [phone]

## 2019-05-22 ENCOUNTER — Inpatient Hospital Stay
Admission: EM | Admit: 2019-05-22 | Discharge: 2019-05-24 | DRG: 640 | Disposition: A | Discharge status: Home / Self Care | Payer: BC Managed Care – PPO | Attending: Internal Medicine | Admitting: Internal Medicine

## 2019-05-22 ENCOUNTER — Emergency Department: Payer: BC Managed Care – PPO

## 2019-05-22 ENCOUNTER — Other Ambulatory Visit: Payer: Self-pay

## 2019-05-22 DIAGNOSIS — Z833 Family history of diabetes mellitus: Secondary | ICD-10-CM

## 2019-05-22 DIAGNOSIS — Z888 Allergy status to other drugs, medicaments and biological substances status: Secondary | ICD-10-CM

## 2019-05-22 DIAGNOSIS — N186 End stage renal disease: Secondary | ICD-10-CM | POA: Diagnosis present

## 2019-05-22 DIAGNOSIS — E8770 Fluid overload, unspecified: Principal | ICD-10-CM | POA: Diagnosis present

## 2019-05-22 DIAGNOSIS — Z7982 Long term (current) use of aspirin: Secondary | ICD-10-CM | POA: Diagnosis not present

## 2019-05-22 DIAGNOSIS — Z79891 Long term (current) use of opiate analgesic: Secondary | ICD-10-CM

## 2019-05-22 DIAGNOSIS — Y835 Amputation of limb(s) as the cause of abnormal reaction of the patient, or of later complication, without mention of misadventure at the time of the procedure: Secondary | ICD-10-CM | POA: Diagnosis present

## 2019-05-22 DIAGNOSIS — J189 Pneumonia, unspecified organism: Secondary | ICD-10-CM | POA: Diagnosis present

## 2019-05-22 DIAGNOSIS — E1122 Type 2 diabetes mellitus with diabetic chronic kidney disease: Secondary | ICD-10-CM | POA: Diagnosis present

## 2019-05-22 DIAGNOSIS — Z992 Dependence on renal dialysis: Secondary | ICD-10-CM

## 2019-05-22 DIAGNOSIS — Z794 Long term (current) use of insulin: Secondary | ICD-10-CM

## 2019-05-22 DIAGNOSIS — I120 Hypertensive chronic kidney disease with stage 5 chronic kidney disease or end stage renal disease: Secondary | ICD-10-CM | POA: Diagnosis present

## 2019-05-22 DIAGNOSIS — Z79899 Other long term (current) drug therapy: Secondary | ICD-10-CM | POA: Diagnosis not present

## 2019-05-22 DIAGNOSIS — I5033 Acute on chronic diastolic (congestive) heart failure: Secondary | ICD-10-CM

## 2019-05-22 DIAGNOSIS — I5031 Acute diastolic (congestive) heart failure: Secondary | ICD-10-CM | POA: Diagnosis not present

## 2019-05-22 DIAGNOSIS — Z20828 Contact with and (suspected) exposure to other viral communicable diseases: Secondary | ICD-10-CM | POA: Diagnosis present

## 2019-05-22 DIAGNOSIS — E875 Hyperkalemia: Secondary | ICD-10-CM

## 2019-05-22 DIAGNOSIS — R0602 Shortness of breath: Secondary | ICD-10-CM

## 2019-05-22 DIAGNOSIS — S91309A Unspecified open wound, unspecified foot, initial encounter: Secondary | ICD-10-CM

## 2019-05-22 DIAGNOSIS — I509 Heart failure, unspecified: Secondary | ICD-10-CM

## 2019-05-22 DIAGNOSIS — D631 Anemia in chronic kidney disease: Secondary | ICD-10-CM | POA: Diagnosis present

## 2019-05-22 DIAGNOSIS — Z880 Allergy status to penicillin: Secondary | ICD-10-CM | POA: Diagnosis not present

## 2019-05-22 DIAGNOSIS — T8743 Infection of amputation stump, right lower extremity: Secondary | ICD-10-CM | POA: Diagnosis present

## 2019-05-22 DIAGNOSIS — R0902 Hypoxemia: Secondary | ICD-10-CM

## 2019-05-22 DIAGNOSIS — Z8249 Family history of ischemic heart disease and other diseases of the circulatory system: Secondary | ICD-10-CM

## 2019-05-22 DIAGNOSIS — J9601 Acute respiratory failure with hypoxia: Secondary | ICD-10-CM | POA: Diagnosis present

## 2019-05-22 DIAGNOSIS — N2581 Secondary hyperparathyroidism of renal origin: Secondary | ICD-10-CM | POA: Diagnosis present

## 2019-05-22 DIAGNOSIS — K219 Gastro-esophageal reflux disease without esophagitis: Secondary | ICD-10-CM | POA: Diagnosis present

## 2019-05-22 DIAGNOSIS — I252 Old myocardial infarction: Secondary | ICD-10-CM | POA: Diagnosis not present

## 2019-05-22 DIAGNOSIS — R0603 Acute respiratory distress: Secondary | ICD-10-CM

## 2019-05-22 DIAGNOSIS — E785 Hyperlipidemia, unspecified: Secondary | ICD-10-CM | POA: Diagnosis present

## 2019-05-22 LAB — BASIC METABOLIC PANEL
Anion gap: 17 — ABNORMAL HIGH (ref 5–15)
BUN: 115 mg/dL — ABNORMAL HIGH (ref 8–23)
CO2: 23 mmol/L (ref 22–32)
Calcium: 8.1 mg/dL — ABNORMAL LOW (ref 8.9–10.3)
Chloride: 99 mmol/L (ref 98–111)
Creatinine, Ser: 19.86 mg/dL — ABNORMAL HIGH (ref 0.61–1.24)
GFR calc Af Amer: 2 mL/min — ABNORMAL LOW (ref >60)
GFR calc non Af Amer: 2 mL/min — ABNORMAL LOW (ref >60)
Glucose, Bld: 145 mg/dL — ABNORMAL HIGH (ref 70–99)
Potassium: 6.5 mmol/L (ref 3.5–5.1)
Sodium: 139 mmol/L (ref 135–145)

## 2019-05-22 LAB — CBC
HCT: 30.6 % — ABNORMAL LOW (ref 39.0–52.0)
Hemoglobin: 9.9 g/dL — ABNORMAL LOW (ref 13.0–17.0)
MCH: 31.9 pg (ref 26.0–34.0)
MCHC: 32.4 g/dL (ref 30.0–36.0)
MCV: 98.7 fL (ref 80.0–100.0)
Platelets: 185 10*3/uL (ref 150–400)
RBC: 3.1 MIL/uL — ABNORMAL LOW (ref 4.22–5.81)
RDW: 14.4 % (ref 11.5–15.5)
WBC: 10.2 10*3/uL (ref 4.0–10.5)
nRBC: 0 % (ref 0.0–0.2)

## 2019-05-22 LAB — SARS CORONAVIRUS 2 BY RT PCR (HOSPITAL ORDER, PERFORMED IN ~~LOC~~ HOSPITAL LAB): SARS Coronavirus 2: NEGATIVE

## 2019-05-22 LAB — GLUCOSE, CAPILLARY: Glucose-Capillary: 115 mg/dL — ABNORMAL HIGH (ref 70–99)

## 2019-05-22 LAB — SEDIMENTATION RATE: Sed Rate: 60 mm/hr — ABNORMAL HIGH (ref 0–20)

## 2019-05-22 MED ORDER — ALBUTEROL SULFATE (2.5 MG/3ML) 0.083% IN NEBU: 5.0000 mg | INHALATION_SOLUTION | Freq: Once | RESPIRATORY_TRACT | Status: DC

## 2019-05-22 MED ORDER — GABAPENTIN 300 MG PO CAPS
300.0000 mg | ORAL_CAPSULE | Freq: Three times a day (TID) | ORAL | Status: DC
  Administered 2019-05-23 – 2019-05-24 (×3): 300 mg via ORAL
  Filled 2019-05-22 (×3): qty 1

## 2019-05-22 MED ORDER — ASPIRIN EC 81 MG PO TBEC
81.0000 mg | DELAYED_RELEASE_TABLET | Freq: Every day | ORAL | Status: DC
  Administered 2019-05-23 – 2019-05-24 (×2): 81 mg via ORAL
  Filled 2019-05-22 (×2): qty 1

## 2019-05-22 MED ORDER — AMLODIPINE BESYLATE 10 MG PO TABS
10.0000 mg | ORAL_TABLET | Freq: Every day | ORAL | Status: DC
  Administered 2019-05-23 – 2019-05-24 (×2): 10 mg via ORAL
  Filled 2019-05-22 (×2): qty 1

## 2019-05-22 MED ORDER — CALCIUM GLUCONATE-NACL 1-0.675 GM/50ML-% IV SOLN
1.0000 g | Freq: Once | INTRAVENOUS | Status: AC
  Administered 2019-05-22: 1000 mg via INTRAVENOUS
  Filled 2019-05-22: qty 50

## 2019-05-22 MED ORDER — RENA-VITE PO TABS: 1.0000 | ORAL_TABLET | ORAL | Status: DC

## 2019-05-22 MED ORDER — VANCOMYCIN HCL 10 G IV SOLR
2000.0000 mg | Freq: Once | INTRAVENOUS | Status: AC
  Administered 2019-05-23: 2000 mg via INTRAVENOUS
  Filled 2019-05-22 (×2): qty 2000

## 2019-05-22 MED ORDER — SODIUM CHLORIDE 0.9 % IV SOLN
1.0000 g | Freq: Once | INTRAVENOUS | Status: DC
  Filled 2019-05-22: qty 10

## 2019-05-22 MED ORDER — INSULIN DETEMIR 100 UNIT/ML ~~LOC~~ SOLN
20.0000 [IU] | Freq: Every day | SUBCUTANEOUS | Status: DC
  Administered 2019-05-23: 20 [IU] via SUBCUTANEOUS
  Filled 2019-05-22 (×3): qty 0.2

## 2019-05-22 MED ORDER — OXYCODONE-ACETAMINOPHEN 5-325 MG PO TABS: 1.0000 | ORAL_TABLET | Freq: Four times a day (QID) | ORAL | Status: DC | PRN

## 2019-05-22 MED ORDER — IPRATROPIUM-ALBUTEROL 0.5-2.5 (3) MG/3ML IN SOLN
3.0000 mL | Freq: Four times a day (QID) | RESPIRATORY_TRACT | Status: DC
  Administered 2019-05-22 – 2019-05-23 (×2): 3 mL via RESPIRATORY_TRACT
  Filled 2019-05-22 (×3): qty 3

## 2019-05-22 MED ORDER — CALCIUM ACETATE (PHOS BINDER) 667 MG PO CAPS
1334.0000 mg | ORAL_CAPSULE | Freq: Three times a day (TID) | ORAL | Status: DC
  Administered 2019-05-23 – 2019-05-24 (×2): 1334 mg via ORAL
  Filled 2019-05-22 (×2): qty 2

## 2019-05-22 MED ORDER — INSULIN ASPART 100 UNIT/ML ~~LOC~~ SOLN: 0.0000 [IU] | Freq: Three times a day (TID) | SUBCUTANEOUS | Status: DC

## 2019-05-22 MED ORDER — FERRIC CITRATE 1 GM 210 MG(FE) PO TABS
210.0000 mg | ORAL_TABLET | Freq: Three times a day (TID) | ORAL | Status: DC
  Administered 2019-05-23 – 2019-05-24 (×2): 210 mg via ORAL
  Filled 2019-05-22 (×6): qty 1

## 2019-05-22 MED ORDER — ROPINIROLE HCL 1 MG PO TABS
1.0000 mg | ORAL_TABLET | Freq: Three times a day (TID) | ORAL | Status: DC
  Administered 2019-05-23 – 2019-05-24 (×3): 1 mg via ORAL
  Filled 2019-05-22 (×3): qty 1

## 2019-05-22 MED ORDER — LABETALOL HCL 5 MG/ML IV SOLN
10.0000 mg | INTRAVENOUS | Status: DC | PRN
  Administered 2019-05-22 – 2019-05-23 (×2): 10 mg via INTRAVENOUS
  Filled 2019-05-22 (×2): qty 4

## 2019-05-22 MED ORDER — GENTAMICIN SULFATE 0.1 % EX CREA: 1.0000 "application " | TOPICAL_CREAM | Freq: Every day | CUTANEOUS | Status: DC

## 2019-05-22 MED ORDER — INSULIN ASPART 100 UNIT/ML ~~LOC~~ SOLN
0.0000 [IU] | Freq: Every day | SUBCUTANEOUS | Status: DC
  Filled 2019-05-22: qty 1

## 2019-05-22 MED ORDER — LISINOPRIL 20 MG PO TABS
40.0000 mg | ORAL_TABLET | Freq: Every day | ORAL | Status: DC
  Administered 2019-05-23 – 2019-05-24 (×2): 40 mg via ORAL
  Filled 2019-05-22 (×2): qty 2

## 2019-05-22 MED ORDER — LEVOFLOXACIN 500 MG PO TABS
500.0000 mg | ORAL_TABLET | ORAL | Status: DC
  Administered 2019-05-23: 500 mg via ORAL
  Filled 2019-05-22: qty 1

## 2019-05-22 MED ORDER — METOPROLOL SUCCINATE ER 50 MG PO TB24
50.0000 mg | ORAL_TABLET | Freq: Every day | ORAL | Status: DC
  Administered 2019-05-23 – 2019-05-24 (×2): 50 mg via ORAL
  Filled 2019-05-22 (×2): qty 1

## 2019-05-22 MED ORDER — ALBUTEROL SULFATE HFA 108 (90 BASE) MCG/ACT IN AERS: 1.0000 | INHALATION_SPRAY | Freq: Four times a day (QID) | RESPIRATORY_TRACT | Status: DC

## 2019-05-22 MED ORDER — BUDESONIDE 0.5 MG/2ML IN SUSP
0.5000 mg | Freq: Two times a day (BID) | RESPIRATORY_TRACT | Status: DC
  Administered 2019-05-22 – 2019-05-24 (×4): 0.5 mg via RESPIRATORY_TRACT
  Filled 2019-05-22 (×4): qty 2

## 2019-05-22 MED ORDER — SODIUM ZIRCONIUM CYCLOSILICATE 10 G PO PACK
10.0000 g | PACK | Freq: Once | ORAL | Status: AC
  Administered 2019-05-22: 10 g via ORAL
  Filled 2019-05-22: qty 1

## 2019-05-22 MED ORDER — LEVOFLOXACIN IN D5W 750 MG/150ML IV SOLN
750.0000 mg | Freq: Once | INTRAVENOUS | Status: AC
  Administered 2019-05-22: 750 mg via INTRAVENOUS
  Filled 2019-05-22: qty 150

## 2019-05-22 MED ORDER — ACETAMINOPHEN 325 MG PO TABS
325.0000 mg | ORAL_TABLET | ORAL | Status: DC | PRN
  Administered 2019-05-23: 325 mg via ORAL
  Filled 2019-05-22: qty 1

## 2019-05-22 MED ORDER — LANTHANUM CARBONATE 500 MG PO CHEW
1000.0000 mg | CHEWABLE_TABLET | Freq: Three times a day (TID) | ORAL | Status: DC
  Administered 2019-05-23 – 2019-05-24 (×2): 1000 mg via ORAL
  Filled 2019-05-22 (×6): qty 2

## 2019-05-22 MED ORDER — CALCIUM CARBONATE ANTACID 500 MG PO CHEW
2.0000 | CHEWABLE_TABLET | Freq: Every day | ORAL | Status: DC
  Administered 2019-05-23: 400 mg via ORAL
  Filled 2019-05-22: qty 2

## 2019-05-22 NOTE — ED Notes (Signed)
Patient transported to X-ray 

## 2019-05-22 NOTE — Progress Notes (Addendum)
Pharmacy Antibiotic Note  [place] Silva is a 63 y.o. male admitted on 05/22/2019. Pharmacy has been consulted for vancomycin dosing for wound infection and levofloxacin for CAP.  Patient is a dialysis patient and has missed several sessions.  Plan: Vancomycin 2000 mg IV x 1. Unclear what the plan for dialysis is at this time. Will follow up plan in the morning and dose vancomycin accordingly.  Levofloxacin 750 mg IV x 1 given. Will order levofloxacin 500 mg PO q48h to start tomorrow. Will adjust according to dialysis schedule.  Height: 5\' 11"  (180.3 cm) Weight: 260 lb (117.9 kg) IBW/kg (Calculated) : 75.3  Temp (24hrs), Avg:98.2 F (36.8 C), Min:98.2 F (36.8 C), Max:98.2 F (36.8 C)  Recent Labs  Lab 05/22/19 1529  WBC 10.2  CREATININE 19.86*    Estimated Creatinine Clearance: 4.8 mL/min (A) (by C-G formula based on SCr of 19.86 mg/dL (H)).    Allergies  Allergen Reactions  . Penicillins Anaphylaxis    Was told it could kill him, pt unsure of reaction  Has patient had a PCN reaction causing immediate rash, facial/tongue/throat swelling, SOB or lightheadedness with hypotension: No Has patient had a PCN reaction causing severe rash involving mucus membranes or skin necrosis: No Has patient had a PCN reaction that required hospitalization: No Has patient had a PCN reaction occurring within the last 10 years: No If all of the above answers are "NO", then may proceed with Cephalosporin use.   . Cyclopentolate Other (See Comments)    Antimicrobials this admission: Levofloxacin 10/12 >> Vancomycin 10/12 >>  Dose adjustments this admission: NA  Microbiology results: 10/12 BCx: pending  Thank you for allowing pharmacy to be a part of this patient's care.  Tawnya Crook, PharmD 05/22/2019 7:23 PM

## 2019-05-22 NOTE — ED Triage Notes (Addendum)
Pt comes via POV from home with c/o cough and shaking. Pt states he missed his dialysis treatment Saturday. Pt states he was real sick today and missed treatment.   Pt states he hasn't been around anyone with COVID.  Pt denies any CP, SOB, fever, chills or abdominal pain.  Pt talking and appears to be having difficulty catching his breath. Pt's belly is swollen and firm. Pt is breathing heavy and labored.

## 2019-05-22 NOTE — H&P (Addendum)
[facility] [place] NAME: Donald Silva    MR#:  [id number]  DATE OF BIRTH:  [birth year]  DATE OF ADMISSION:  05/22/2019  PRIMARY CARE PHYSICIAN: Letta Median, MD   REQUESTING/REFERRING PHYSICIAN: Dr Charolett Bumpers  CHIEF COMPLAINT:   Chief Complaint  Patient presents with  . Cough    HISTORY OF PRESENT ILLNESS:  Donald Silva  is a 63 y.o. male coming in not feeling well.  He states he is so weak and can hardly walk.  He cannot breathe.  He is short of breath.  He has been twitching.  He states he missed dialysis on Saturday and thinks that that was the cause of how he is feeling now.  He thinks he has had a cold for the past 2 days.  He got the flu shot then got sick.  He has been being treated for an infection on his foot.  He follows with Dr. Amalia Hailey podiatry with [facility].  The patient's wife states that it was deeper and it starting to close up.  The patient was hypoxic in the emergency room and was placed on oxygen 2 L.  His COVID-19 test was negative.  Hospitalist services contacted for admission.  PAST MEDICAL HISTORY:   Past Medical History:  Diagnosis Date  . CKD (chronic kidney disease) stage 5, GFR less than 15 ml/min (HCC)   . Diabetes mellitus without complication ([place])   . Dyspnea   . GERD (gastroesophageal reflux disease)   . Hypertension   . Lower extremity edema   . Myocardial infarction [facility])     PAST SURGICAL HISTORY:   Past Surgical History:  Procedure Laterality Date  . A/V FISTULAGRAM N/A 05/03/2018   Procedure: A/V FISTULAGRAM;  Surgeon: Katha Cabal, MD;  Location: [facility];  Service: Cardiovascular;  Laterality: N/A;  . AV FISTULA PLACEMENT Left 08/27/2017   Procedure: ARTERIOVENOUS (AV) FISTULA CREATION ( BRACHIAL CEPHALIC );  Surgeon: Katha Cabal, MD;  Location: [facility] ORS;  Service: Vascular;  Laterality: Left;  . CHOLECYSTECTOMY    . TOE SURGERY  Right     SOCIAL HISTORY:   Social History   Tobacco Use  . Smoking status: Never Smoker  . Smokeless tobacco: Never Used  Substance Use Topics  . Alcohol use: No    FAMILY HISTORY:   Family History  Problem Relation Age of Onset  . Hypertension Sister   . Diabetes Sister   . Diabetes Father     DRUG ALLERGIES:   Allergies  Allergen Reactions  . Penicillins Anaphylaxis    Was told it could kill him, pt unsure of reaction  Has patient had a PCN reaction causing immediate rash, facial/tongue/throat swelling, SOB or lightheadedness with hypotension: No Has patient had a PCN reaction causing severe rash involving mucus membranes or skin necrosis: No Has patient had a PCN reaction that required hospitalization: No Has patient had a PCN reaction occurring within the last 10 years: No If all of the above answers are "NO", then may proceed with Cephalosporin use.   . Cyclopentolate Other (See Comments)    REVIEW OF SYSTEMS:  CONSTITUTIONAL: No fever, chills or sweats.  Positive for weakness.  EYES: No blurred or double vision.  EARS, NOSE, AND THROAT: No tinnitus or ear pain. No sore throat RESPIRATORY: Positive for cough, positive for shortness of breath, slight wheezing.  No hemoptysis.  CARDIOVASCULAR: No chest pain, orthopnea.trace edema.  GASTROINTESTINAL: No nausea, vomiting, diarrhea or abdominal pain. No blood in bowel movements GENITOURINARY: No dysuria, hematuria.  ENDOCRINE: No polyuria, nocturia,  HEMATOLOGY: No anemia, easy bruising or bleeding SKIN: No rash or lesion. MUSCULOSKELETAL: No joint pain or arthritis.   NEUROLOGIC: Positive for twitching PSYCHIATRY: No anxiety or depression.   MEDICATIONS AT HOME:   Prior to Admission medications   Medication Sig Start Date End Date Taking? Authorizing Provider  acetaminophen (MAPAP) 325 MG tablet Take 325 mg by mouth every 4 (four) hours as needed for mild pain.  01/31/19 01/30/20  [provider]   albuterol (PROVENTIL HFA;VENTOLIN HFA) 108 (90 Base) MCG/ACT inhaler Inhale 1-2 puffs into the lungs every 6 (six) hours as needed for wheezing or shortness of breath.  08/17/18 08/17/19  [provider]  amLODipine (NORVASC) 10 MG tablet Take 10 mg by mouth daily.  09/02/18   [provider]  aspirin EC 81 MG tablet Take 81 mg daily by mouth.    [provider]  AURYXIA 1 GM 210 MG(Fe) tablet Take 210 mg by mouth 3 (three) times daily with meals.  05/20/18   [provider]  b complex-vitamin c-folic acid (NEPHRO-VITE) 0.8 MG TABS tablet Take 1 tablet by mouth every Monday, Wednesday, and Friday. After dialysis treatment 03/11/18   [provider]  calcium acetate (PHOSLO) 667 MG capsule Take 1,334 mg by mouth 3 (three) times daily with meals.  01/28/18   [provider]  calcium carbonate (TUMS EX) 750 MG chewable tablet Chew 2 tablets by mouth at bedtime.    [provider]  clindamycin (CLEOCIN) 300 MG capsule Take 1 capsule (300 mg total) by mouth 3 (three) times daily. 05/05/19   Edrick Kins, DPM  doxycycline (VIBRA-TABS) 100 MG tablet Take 1 tablet (100 mg total) by mouth 2 (two) times daily. 04/05/19   Edrick Kins, DPM  gabapentin (NEURONTIN) 300 MG capsule Take 300 mg by mouth 3 (three) times daily.  07/22/18   [provider]  gentamicin cream (GARAMYCIN) 0.1 % Apply 1 application topically 2 (two) times daily. 10/18/18   Edrick Kins, DPM  heparin 1000 UNIT/ML injection Heparin Sodium (Porcine) 1,000 Units/mL Systemic 01/17/19 01/16/20  [provider]  Insulin Detemir (LEVEMIR FLEXTOUCH) 100 UNIT/ML Pen Inject 66 Units into the skin at bedtime.  06/01/16   [provider]  lanthanum (FOSRENOL) 1000 MG chewable tablet Chew 1,000 mg by mouth 3 (three) times daily with meals.  05/30/18   [provider]  lisinopril (ZESTRIL) 40 MG tablet Take 40 mg by mouth daily.  02/05/18   [provider]   metoprolol succinate (TOPROL-XL) 50 MG 24 hr tablet Take 50 mg by mouth daily.  09/12/18   [provider]  oxyCODONE-acetaminophen (PERCOCET) 5-325 MG tablet Take 1 tablet by mouth every 6 (six) hours as needed for severe pain. 03/02/19   Edrick Kins, DPM  rOPINIRole (REQUIP) 1 MG tablet Take 1 mg by mouth 3 (three) times daily.  03/23/19   [provider]      VITAL SIGNS:  Blood pressure (!) 144/97, pulse 95, resp. rate (!) 24, height 5\' 11"  (1.803 m), weight 117.9 kg, SpO2 98 %.  PHYSICAL EXAMINATION:  GENERAL:  63 y.o.-year-old patient lying in the bed with no acute distress.  EYES: Pupils equal, round, reactive to light and accommodation. No scleral icterus. Extraocular muscles intact.  HEENT: Head atraumatic, normocephalic. Oropharynx and nasopharynx clear.  NECK:  Supple, no  jugular venous distention. No thyroid enlargement, no tenderness.  LUNGS: Decreased breath sounds bilaterally, upper airway expiratory wheezing.  Positive rales at the bases.  Light use of accessory muscles of respiration.  CARDIOVASCULAR: S1, S2 normal. No murmurs, rubs, or gallops.  ABDOMEN: Soft, nontender, nondistended. Bowel sounds present. No organomegaly or mass.  EXTREMITIES: 2+ pedal edema, no cyanosis, or clubbing.  NEUROLOGIC: Cranial nerves II through XII are intact. Muscle strength 5/5 in all extremities. Sensation intact. Gait not checked.  PSYCHIATRIC: The patient is alert and oriented x 3.  SKIN: Right foot open wound at amputation site with some drainage.  LABORATORY PANEL:   CBC Recent Labs  Lab 05/22/19 1529  WBC 10.2  HGB 9.9*  HCT 30.6*  PLT 185   ------------------------------------------------------------------------------------------------------------------  Chemistries  Recent Labs  Lab 05/22/19 1529  NA 139  K 6.5*  CL 99  CO2 23  GLUCOSE 145*  BUN 115*  CREATININE 19.86*  CALCIUM 8.1*    ------------------------------------------------------------------------------------------------------------------   RADIOLOGY:  Dg Chest 2 View  Result Date: 05/22/2019 CLINICAL DATA:  Cough. EXAM: CHEST - 2 VIEW COMPARISON:  12/25/2013 and 11/19/2013 FINDINGS: There is a hazy left perihilar infiltrate. Bilateral peribronchial thickening. Slight interstitial infiltrate in the right lung. Heart size and pulmonary vascularity are within normal limits. No effusions. No acute bone abnormality. IMPRESSION: 1. Hazy infiltrate in the left perihilar region. 2. Slight interstitial infiltrate in the right lung. 3. Bronchitic changes. Electronically Signed   By: Lorriane Shire M.D.   On: 05/22/2019 15:19    EKG:   Normal sinus rhythm 91 bpm right bundle branch block and left anterior fascicular block.  IMPRESSION AND PLAN:   1.  Acute fluid overload with missed dialysis.  Dialysis to help out with fluid management.  Patient on beta-blocker. 2.  Acute hypoxic respiratory failure.  Patient pulse ox of 88% on room air.  Patient placed on 2 L of oxygen. 3.  Hyperkalemia and twitching.  Dialysis to manage. 4.  Questionable pneumonia.  Patient placed on Levaquin.  We will give nebulizers DuoNeb and budesonide. 5.  Postoperative infection of right foot amputation site.  We will give IV vancomycin while here in the hospital.  Was on clindamycin as outpatient and doxycycline before that.  Follows with podiatry as outpatient.  We will send off a sedimentation rate.  Can consider MRI of the foot if sedimentation rate high.  Can consider podiatry consultation here as inpatient also. 6.  End-stage renal disease.  Dialysis tonight 7.  Type 2 diabetes mellitus.  We will decrease his dose of insulin tonight and do sliding scale 8.  Hypertension.  Continue usual medications    All the records are reviewed and case discussed with ED provider. Management plans discussed with the patient, family and they are in  agreement.  CODE STATUS: full code  TOTAL TIME TAKING CARE OF THIS PATIENT: 50 minutes.    Loletha Grayer M.D on 05/22/2019 at 7:14 PM  Between 7am to 6pm - Pager - [phone]  After 6pm call admission pager [phone]  [employer] Office  [phone]  CC: Primary care physician; Letta Median, MD

## 2019-05-22 NOTE — ED Notes (Signed)
This RN attempted to call report.  

## 2019-05-22 NOTE — ED Notes (Signed)
.. ED TO INPATIENT HANDOFF REPORT  ED Nurse Name and Phone #: Deneise Lever [phone]  S Name/Age/Gender Donald Silva 63 y.o. male Room/Bed: ED06A/ED06A  Code Status   Code Status: Prior  Home/SNF/Other Home Patient oriented to: self, place, time and situation Is this baseline? Yes   Triage Complete: Triage complete  Chief Complaint cough  Triage Note Pt comes via POV from home with c/o cough and shaking. Pt states he missed his dialysis treatment Saturday. Pt states he was real sick today and missed treatment.   Pt states he hasn't been around anyone with COVID.  Pt denies any CP, SOB, fever, chills or abdominal pain.  Pt talking and appears to be having difficulty catching his breath. Pt's belly is swollen and firm. Pt is breathing heavy and labored.   Allergies Allergies  Allergen Reactions  . Penicillins Anaphylaxis    Was told it could kill him, pt unsure of reaction  Has patient had a PCN reaction causing immediate rash, facial/tongue/throat swelling, SOB or lightheadedness with hypotension: No Has patient had a PCN reaction causing severe rash involving mucus membranes or skin necrosis: No Has patient had a PCN reaction that required hospitalization: No Has patient had a PCN reaction occurring within the last 10 years: No If all of the above answers are "NO", then may proceed with Cephalosporin use.   . Cyclopentolate Other (See Comments)    Level of Care/Admitting Diagnosis ED Disposition    ED Disposition Condition Comment   Admit  Hospital Area: [place] [[id number]]  Level of Care: Med-Surg [16]  Covid Evaluation: Confirmed COVID Negative  Diagnosis: Acute CHF (congestive heart failure) [facility]) [[id number]]  Admitting Physician: Loletha Grayer [[id number]]  Attending Physician: Loletha Grayer [phone]  Estimated length of stay: past midnight tomorrow  Certification:: I certify this patient will need inpatient services for at least 2 midnights  PT  Class (Do Not Modify): Inpatient [101]  PT Acc Code (Do Not Modify): Private [1]       B Medical/Surgery History Past Medical History:  Diagnosis Date  . CKD (chronic kidney disease) stage 5, GFR less than 15 ml/min (HCC)   . Diabetes mellitus without complication ([place])   . Dyspnea   . GERD (gastroesophageal reflux disease)   . Hypertension   . Lower extremity edema   . Myocardial infarction [facility])    Past Surgical History:  Procedure Laterality Date  . A/V FISTULAGRAM N/A 05/03/2018   Procedure: A/V FISTULAGRAM;  Surgeon: Katha Cabal, MD;  Location: [facility];  Service: Cardiovascular;  Laterality: N/A;  . AV FISTULA PLACEMENT Left 08/27/2017   Procedure: ARTERIOVENOUS (AV) FISTULA CREATION ( BRACHIAL CEPHALIC );  Surgeon: Katha Cabal, MD;  Location: [facility] ORS;  Service: Vascular;  Laterality: Left;  . CHOLECYSTECTOMY    . TOE SURGERY Right      A IV Location/Drains/Wounds Patient Lines/Drains/Airways Status   Active Line/Drains/Airways    Name:   Placement date:   Placement time:   Site:   Days:   Peripheral IV 05/22/19 Right Antecubital   05/22/19    1650    Antecubital   less than 1   Fistula / Graft Left Upper arm Arteriovenous fistula   08/27/17    0855    Upper arm   633   Incision (Closed) 08/27/17 Arm Left   08/27/17    0805     633          Intake/Output Last 24 hours  Intake/Output Summary (Last 24 hours) at 05/22/2019 1914 Last data filed at 05/22/2019 1803 Gross per 24 hour  Intake 50 ml  Output -  Net 50 ml    Labs/Imaging Results for orders placed or performed during the hospital encounter of 05/22/19 (from the past 48 hour(s))  CBC     Status: Abnormal   Collection Time: 05/22/19  3:29 PM  Result Value Ref Range   WBC 10.2 4.0 - 10.5 K/uL   RBC 3.10 (L) 4.22 - 5.81 MIL/uL   Hemoglobin 9.9 (L) 13.0 - 17.0 g/dL   HCT 30.6 (L) 39.0 - 52.0 %   MCV 98.7 80.0 - 100.0 fL   MCH 31.9 26.0 - 34.0 pg   MCHC 32.4 30.0 - 36.0 g/dL    RDW 14.4 11.5 - 15.5 %   Platelets 185 150 - 400 K/uL   nRBC 0.0 0.0 - 0.2 %    Comment: Performed at [facility], [address] [place], [place] [address]  Basic metabolic panel     Status: Abnormal   Collection Time: 05/22/19  3:29 PM  Result Value Ref Range   Sodium 139 135 - 145 mmol/L   Potassium 6.5 (HH) 3.5 - 5.1 mmol/L    Comment: CRITICAL RESULT CALLED TO, READ BACK BY AND VERIFIED WITH Tiwana Chavis AT 1609 05/22/2019  TFK    Chloride 99 98 - 111 mmol/L   CO2 23 22 - 32 mmol/L   Glucose, Bld 145 (H) 70 - 99 mg/dL   BUN 115 (H) 8 - 23 mg/dL    Comment: RESULT CONFIRMED BY MANUAL DILUTION/TFK   Creatinine, Ser 19.86 (H) 0.61 - 1.24 mg/dL   Calcium 8.1 (L) 8.9 - 10.3 mg/dL   GFR calc non Af Amer 2 (L) >60 mL/min   GFR calc Af Amer 2 (L) >60 mL/min   Anion gap 17 (H) 5 - 15    Comment: Performed at [facility], [address] [place], [place] [address]  SARS Coronavirus 2 by RT PCR (hospital order, performed in [facility] hospital lab) Nasopharyngeal Nasopharyngeal Swab     Status: None   Collection Time: 05/22/19  5:21 PM   Specimen: Nasopharyngeal Swab  Result Value Ref Range   SARS Coronavirus 2 NEGATIVE NEGATIVE    Comment: (NOTE) If result is NEGATIVE SARS-CoV-2 target nucleic acids are NOT DETECTED. The SARS-CoV-2 RNA is generally detectable in upper and lower  respiratory specimens during the acute phase of infection. The lowest  concentration of SARS-CoV-2 viral copies this assay can detect is 250  copies / mL. A negative result does not preclude SARS-CoV-2 infection  and should not be used as the sole basis for treatment or other  patient management decisions.  A negative result may occur with  improper specimen collection / handling, submission of specimen other  than nasopharyngeal swab, presence of viral mutation(s) within the  areas targeted by this assay, and inadequate number of viral copies  (<250 copies / mL). A negative result  must be combined with clinical  observations, patient history, and epidemiological information. If result is POSITIVE SARS-CoV-2 target nucleic acids are DETECTED. The SARS-CoV-2 RNA is generally detectable in upper and lower  respiratory specimens dur ing the acute phase of infection.  Positive  results are indicative of active infection with SARS-CoV-2.  Clinical  correlation with patient history and other diagnostic information is  necessary to determine patient infection status.  Positive results do  not rule out bacterial infection  or co-infection with other viruses. If result is PRESUMPTIVE POSTIVE SARS-CoV-2 nucleic acids MAY BE PRESENT.   A presumptive positive result was obtained on the submitted specimen  and confirmed on repeat testing.  While 2019 novel coronavirus  (SARS-CoV-2) nucleic acids may be present in the submitted sample  additional confirmatory testing may be necessary for epidemiological  and / or clinical management purposes  to differentiate between  SARS-CoV-2 and other Sarbecovirus currently known to infect humans.  If clinically indicated additional testing with an alternate test  methodology [phone]) is advised. The SARS-CoV-2 RNA is generally  detectable in upper and lower respiratory sp ecimens during the acute  phase of infection. The expected result is Negative. Fact Sheet for Patients:  StrictlyIdeas.no Fact Sheet for Healthcare Providers: BankingDealers.co.za This test is not yet approved or cleared by the [place] FDA and has been authorized for detection and/or diagnosis of SARS-CoV-2 by FDA under an Emergency Use Authorization (EUA).  This EUA will remain in effect (meaning this test can be used) for the duration of the COVID-19 declaration under Section 564(b)(1) of the Act, 21 U.S.C. section 360bbb-3(b)(1), unless the authorization is terminated or revoked sooner. Performed at [facility], [address], [place], [place] [address]    Dg Chest 2 View  Result Date: 05/22/2019 CLINICAL DATA:  Cough. EXAM: CHEST - 2 VIEW COMPARISON:  12/25/2013 and 11/19/2013 FINDINGS: There is a hazy left perihilar infiltrate. Bilateral peribronchial thickening. Slight interstitial infiltrate in the right lung. Heart size and pulmonary vascularity are within normal limits. No effusions. No acute bone abnormality. IMPRESSION: 1. Hazy infiltrate in the left perihilar region. 2. Slight interstitial infiltrate in the right lung. 3. Bronchitic changes. Electronically Signed   By: Lorriane Shire M.D.   On: 05/22/2019 15:19    Pending Labs Unresulted Labs (From admission, onward)    Start     Ordered   05/22/19 1838  Blood culture (routine x 2)  BLOOD CULTURE X 2,   STAT     05/22/19 1839          Vitals/Pain Today's Vitals   05/22/19 1700 05/22/19 1730 05/22/19 1737 05/22/19 1738  BP:  (!) 144/97    Pulse: 95  95 95  Resp: (!) 24     SpO2: 99%  98% 98%  Weight:      Height:      PainSc:        Isolation Precautions No active isolations  Medications Medications  levofloxacin (LEVAQUIN) IVPB 750 mg (750 mg Intravenous New Bag/Given 05/22/19 1908)  acetaminophen (TYLENOL) tablet 325 mg (has no administration in time range)  aspirin EC tablet 81 mg (has no administration in time range)  oxyCODONE-acetaminophen (PERCOCET/ROXICET) 5-325 MG per tablet 1 tablet (has no administration in time range)  amLODipine (NORVASC) tablet 10 mg (has no administration in time range)  lisinopril (ZESTRIL) tablet 40 mg (has no administration in time range)  metoprolol succinate (TOPROL-XL) 24 hr tablet 50 mg (has no administration in time range)  Insulin Detemir (LEVEMIR) FlexPen 20 Units (has no administration in time range)  ferric citrate (AURYXIA) tablet 210 mg (has no administration in time range)  calcium acetate (PHOSLO) capsule 1,334 mg (has no administration in time range)   calcium carbonate (TUMS EX) chewable tablet 1,500 mg (has no administration in time range)  lanthanum (FOSRENOL) chewable tablet 1,000 mg (has no administration in time range)  gabapentin (NEURONTIN) capsule 300 mg (has no administration in time range)  rOPINIRole (  REQUIP) tablet 1 mg (has no administration in time range)  multivitamin (RENA-VIT) tablet 1 tablet (has no administration in time range)  albuterol (VENTOLIN HFA) 108 (90 Base) MCG/ACT inhaler 1-2 puff (has no administration in time range)  gentamicin cream (GARAMYCIN) 0.1 % 1 application (has no administration in time range)  sodium zirconium cyclosilicate (LOKELMA) packet 10 g (10 g Oral Given 05/22/19 1656)  calcium gluconate 1 g/ 50 mL sodium chloride IVPB (0 g Intravenous Stopped 05/22/19 1803)    Mobility walks Low fall risk   Focused Assessments Renal Assessment Handoff:  Hemodialysis Schedule: Hemodialysis Schedule: Tuesday/Thursday/Saturday Last Hemodialysis date and time: Thursday   Restricted appendage: left arm     R Recommendations: See Admitting Provider Note  Report given to:   Additional Notes:

## 2019-05-22 NOTE — ED Notes (Signed)
EKG taken to MD Isaacs and informed EDT Mel of pt needing bed at this time. Wayland Salinas RN notified

## 2019-05-22 NOTE — ED Provider Notes (Signed)
[facility] Emergency Department Provider Note  ____________________________________________   I have reviewed the triage vital signs and the nursing notes.   HISTORY  Chief Complaint Cough   History limited by: Not Limited   HPI Donald Silva is a 63 y.o. male who presents to the emergency department today because of concern for cough, shortness of breath.  Patient states that he he missed his dialysis session 2 days ago because he was feeling unwell.  He had a cough and some chest congestion.  He did go to his dialysis session on Thursday but was told that he would need to come back on Friday.  He states he missed that session as well.  He denies any fevers.  Denies any known sick contacts.   Records reviewed. Per medical record review patient has a history of end stage renal disease on dialysis.   Past Medical History:  Diagnosis Date  . CKD (chronic kidney disease) stage 5, GFR less than 15 ml/min (HCC)   . Diabetes mellitus without complication ([place])   . Dyspnea   . GERD (gastroesophageal reflux disease)   . Hypertension   . Lower extremity edema   . Myocardial infarction [facility])     Patient Active Problem List   Diagnosis Date Noted  . Postop check 03/02/2019  . 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 Complication of vascular access for dialysis 04/20/2018  . Chronic kidney disease, stage V ([place]) 08/11/2017  . Essential hypertension 08/11/2017  . 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[place]) 08/11/2017  . Hyperkalemia 06/24/2017  . Multiple-type hyperlipidemia 03/25/2012  . Vitamin D deficiency 03/25/2012  . ED (erectile dysfunction) of organic origin 09/14/2011    Past Surgical History:  Procedure Laterality Date  . A/V FISTULAGRAM N/A 05/03/2018   Procedure: A/V FISTULAGRAM;  Surgeon: Katha Cabal, MD;  Location: [place] [facility];  Service: Cardiovascular;  Laterality: N/A;  . AV FISTULA PLACEMENT Left  08/27/2017   Procedure: ARTERIOVENOUS (AV) FISTULA CREATION ( BRACHIAL CEPHALIC );  Surgeon: Katha Cabal, MD;  Location: [facility] ORS;  Service: Vascular;  Laterality: Left;  . CHOLECYSTECTOMY    . TOE SURGERY Right     Prior to Admission medications   Medication Sig Start Date End Date Taking? Authorizing Provider  acetaminophen (MAPAP) 325 MG tablet Take by mouth. 01/31/19 01/30/20  [provider]  albuterol (PROVENTIL HFA;VENTOLIN HFA) 108 (90 Base) MCG/ACT inhaler Inhale into the lungs. 08/17/18 08/17/19  [provider]  amLODipine (NORVASC) 10 MG tablet Take by mouth. 09/02/18   [provider]  aspirin EC 81 MG tablet Take 81 mg daily by mouth.    [provider]  AURYXIA 1 GM 210 MG(Fe) tablet  05/20/18   [provider]  b complex-vitamin c-folic acid (NEPHRO-VITE) 0.8 MG TABS tablet Take 1 tablet by mouth every Monday, Wednesday, and Friday. After dialysis treatment 03/11/18   [provider]  calcium acetate (PHOSLO) 667 MG capsule Take 1,334 mg by mouth 3 (three) times daily with meals.  01/28/18   [provider]  calcium carbonate (TUMS EX) 750 MG chewable tablet Chew 2 tablets by mouth at bedtime.    [provider]  clindamycin (CLEOCIN) 300 MG capsule Take 1 capsule (300 mg total) by mouth 3 (three) times daily. 05/05/19   Edrick Kins, DPM  doxycycline (VIBRA-TABS) 100 MG tablet Take 1 tablet (100 mg total) by mouth 2 (two) times daily. 04/05/19  Edrick Kins, DPM  gabapentin (NEURONTIN) 300 MG capsule Take by mouth. 07/22/18   [provider]  gentamicin cream (GARAMYCIN) 0.1 % Apply 1 application topically 2 (two) times daily. 10/18/18   Edrick Kins, DPM  heparin 1000 UNIT/ML injection Heparin Sodium (Porcine) 1,000 Units/mL Systemic 01/17/19 01/16/20  [provider]  Insulin Detemir (LEVEMIR FLEXTOUCH) 100 UNIT/ML Pen Inject 66 Units into the skin at bedtime.  06/01/16   [provider]  lanthanum (FOSRENOL) 1000 MG chewable tablet  05/30/18   [provider]  lisinopril (ZESTRIL) 40 MG tablet Take by mouth. 02/05/18   [provider]  metoprolol succinate (TOPROL-XL) 50 MG 24 hr tablet  09/12/18   [provider]  oxyCODONE-acetaminophen (PERCOCET) 5-325 MG tablet Take 1 tablet by mouth every 6 (six) hours as needed for severe pain. 03/02/19   Edrick Kins, DPM  rOPINIRole (REQUIP) 1 MG tablet Take by mouth. 03/23/19   [provider]    Allergies Penicillins and Cyclopentolate  Family History  Problem Relation Age of Onset  . Hypertension Sister   . Diabetes Sister     Social History Social History   Tobacco Use  . Smoking status: Never Smoker  . Smokeless tobacco: Never Used  Substance Use Topics  . Alcohol use: No  . Drug use: No    Review of Systems Constitutional: No fever/chills Eyes: No visual changes. ENT: No sore throat. Cardiovascular: Denies chest pain. Respiratory: Positive for cough and shortness of breath. Gastrointestinal: No abdominal pain.  No nausea, no vomiting.  No diarrhea.   Genitourinary: Negative for dysuria. Musculoskeletal: Negative for back pain. Skin: Negative for rash. Neurological: Negative for headaches, focal weakness or numbness.  ____________________________________________   PHYSICAL EXAM:  VITAL SIGNS: ED Triage Vitals [05/22/19 1355]  Enc Vitals Group     BP      Pulse      Resp      Temp      Temp src      SpO2      Weight 260 lb (117.9 kg)     Height 5\' 11"  (1.803 m)     Head Circumference      Peak Flow      Pain Score 0   Constitutional: Alert and oriented.  Eyes: Conjunctivae are normal.  ENT      Head: Normocephalic and atraumatic.      Nose: No congestion/rhinnorhea.      Mouth/Throat: Mucous membranes are moist.      Neck: No stridor. Hematological/Lymphatic/Immunilogical: No cervical lymphadenopathy. Cardiovascular: Normal rate, regular  rhythm.  No murmurs, rubs, or gallops.  Respiratory: Increased respiratory effort. Some rhonchi and wheezing appreciated in bilateral lungs.  Gastrointestinal: Soft and non tender. No rebound. No guarding.  Genitourinary: Deferred Musculoskeletal: Normal range of motion in all extremities. No lower extremity edema. Neurologic:  Normal speech and language. No gross focal neurologic deficits are appreciated.  Skin:  Skin is warm, dry and intact. No rash noted. Psychiatric: Mood and affect are normal. Speech and behavior are normal. Patient exhibits appropriate insight and judgment.  ____________________________________________    LABS (pertinent positives/negatives)  CBC wbc 10.2, hgb 9.9, plt 185 BMP na 139, k 6.5, bun 115, cr 19.86, ca 8.1 COVID negative ____________________________________________   EKG  I, Nance Pear, attending physician, personally viewed and interpreted this EKG  EKG Time: 1353 Rate: 91 Rhythm: normal sinus rhythm Axis: left axis deviation Intervals: qtc 506 QRS: LAFB, RBBB ST changes:  no st elevation Impression: abnormal ekg   ____________________________________________    RADIOLOGY  CXR Infiltrate in left perihilar area. Interstitial infiltrate in right lung.   ___________________________________________   PROCEDURES  Procedures  ____________________________________________   INITIAL IMPRESSION / ASSESSMENT AND PLAN / ED COURSE  Pertinent labs & imaging results that were available during my care of the patient were reviewed by me and considered in my medical decision making (see chart for details).   Patient presented to the emergency department today because of concerns for cough, shortness of breath.  Patient had missed his most recent dialysis session apparently was supposed to have a dialysis session Friday that he also missed.  On exam patient does have some increased respiratory effort with some wheezing and rhonchi  appreciated.  Chest x-ray does show some bilateral infiltrates.  Patient denies any fevers.  Covid was negative.  I discussed with Dr. Holley Raring.  Will plan on admission.  Patient was given calcium and lokelma.    ____________________________________________   FINAL CLINICAL IMPRESSION(S) / ED DIAGNOSES  Final diagnoses:  Shortness of breath  Hypoxia  Hyperkalemia     Note: This dictation was prepared with Dragon dictation. Any transcriptional errors that result from this process are unintentional     Nance Pear, MD 05/22/19 [phone]

## 2019-05-22 NOTE — ED Notes (Signed)
Pt oxygen noted to be 88% on room air. Pt placed on 2L nasal cannula. Oxygen now 86% on 2L. MD Archie Balboa aware.

## 2019-05-23 ENCOUNTER — Other Ambulatory Visit: Payer: Self-pay

## 2019-05-23 ENCOUNTER — Inpatient Hospital Stay: Payer: BC Managed Care – PPO

## 2019-05-23 ENCOUNTER — Inpatient Hospital Stay: Admit: 2019-05-23 | Payer: BC Managed Care – PPO

## 2019-05-23 ENCOUNTER — Inpatient Hospital Stay (HOSPITAL_COMMUNITY)
Admit: 2019-05-23 | Discharge: 2019-05-23 | Disposition: A | Discharge status: Home / Self Care | Payer: BC Managed Care – PPO | Attending: Internal Medicine | Admitting: Internal Medicine

## 2019-05-23 DIAGNOSIS — I5031 Acute diastolic (congestive) heart failure: Secondary | ICD-10-CM

## 2019-05-23 LAB — BASIC METABOLIC PANEL
Anion gap: 20 — ABNORMAL HIGH (ref 5–15)
BUN: 81 mg/dL — ABNORMAL HIGH (ref 8–23)
CO2: 21 mmol/L — ABNORMAL LOW (ref 22–32)
Calcium: 8.3 mg/dL — ABNORMAL LOW (ref 8.9–10.3)
Chloride: 97 mmol/L — ABNORMAL LOW (ref 98–111)
Creatinine, Ser: 13.67 mg/dL — ABNORMAL HIGH (ref 0.61–1.24)
GFR calc Af Amer: 4 mL/min — ABNORMAL LOW (ref >60)
GFR calc non Af Amer: 3 mL/min — ABNORMAL LOW (ref >60)
Glucose, Bld: 122 mg/dL — ABNORMAL HIGH (ref 70–99)
Potassium: 4.7 mmol/L (ref 3.5–5.1)
Sodium: 138 mmol/L (ref 135–145)

## 2019-05-23 LAB — TROPONIN I (HIGH SENSITIVITY)
Troponin I (High Sensitivity): 85 ng/L — ABNORMAL HIGH (ref <18)
Troponin I (High Sensitivity): 88 ng/L — ABNORMAL HIGH (ref <18)

## 2019-05-23 LAB — BLOOD GAS, ARTERIAL
Acid-Base Excess: 0.5 mmol/L (ref 0.0–2.0)
Bicarbonate: 25.4 mmol/L (ref 20.0–28.0)
FIO2: 0.36
O2 Saturation: 97.1 %
Patient temperature: 37
pCO2 arterial: 41 mmHg (ref 32.0–48.0)
pH, Arterial: 7.4 (ref 7.350–7.450)
pO2, Arterial: 92 mmHg (ref 83.0–108.0)

## 2019-05-23 LAB — CBC
HCT: 28.5 % — ABNORMAL LOW (ref 39.0–52.0)
Hemoglobin: 9.3 g/dL — ABNORMAL LOW (ref 13.0–17.0)
MCH: 31.6 pg (ref 26.0–34.0)
MCHC: 32.6 g/dL (ref 30.0–36.0)
MCV: 96.9 fL (ref 80.0–100.0)
Platelets: 165 10*3/uL (ref 150–400)
RBC: 2.94 MIL/uL — ABNORMAL LOW (ref 4.22–5.81)
RDW: 14.4 % (ref 11.5–15.5)
WBC: 10.6 10*3/uL — ABNORMAL HIGH (ref 4.0–10.5)
nRBC: 0 % (ref 0.0–0.2)

## 2019-05-23 LAB — ECHOCARDIOGRAM COMPLETE
Height: 70 in
Weight: 4296.32 oz

## 2019-05-23 LAB — HEPATITIS B CORE ANTIBODY, TOTAL: Hep B Core Total Ab: NONREACTIVE

## 2019-05-23 LAB — GLUCOSE, CAPILLARY
Glucose-Capillary: 118 mg/dL — ABNORMAL HIGH (ref 70–99)
Glucose-Capillary: 104 mg/dL — ABNORMAL HIGH (ref 70–99)
Glucose-Capillary: 100 mg/dL — ABNORMAL HIGH (ref 70–99)
Glucose-Capillary: 121 mg/dL — ABNORMAL HIGH (ref 70–99)
Glucose-Capillary: 162 mg/dL — ABNORMAL HIGH (ref 70–99)

## 2019-05-23 LAB — HEMOGLOBIN A1C
Hgb A1c MFr Bld: 6.5 % — ABNORMAL HIGH (ref 4.8–5.6)
Mean Plasma Glucose: 139.85 mg/dL

## 2019-05-23 LAB — HEPATITIS B SURFACE ANTIGEN: Hepatitis B Surface Ag: NONREACTIVE

## 2019-05-23 LAB — PHOSPHORUS: Phosphorus: 6.3 mg/dL — ABNORMAL HIGH (ref 2.5–4.6)

## 2019-05-23 MED ORDER — PERFLUTREN LIPID MICROSPHERE
1.0000 mL | INTRAVENOUS | Status: AC | PRN
  Administered 2019-05-23: 2 mL via INTRAVENOUS
  Filled 2019-05-23: qty 10

## 2019-05-23 MED ORDER — HEPARIN SODIUM (PORCINE) 5000 UNIT/ML IJ SOLN
5000.0000 [IU] | Freq: Three times a day (TID) | INTRAMUSCULAR | Status: DC
  Administered 2019-05-23 – 2019-05-24 (×2): 5000 [IU] via SUBCUTANEOUS
  Filled 2019-05-23 (×2): qty 1

## 2019-05-23 MED ORDER — EPOETIN ALFA 10000 UNIT/ML IJ SOLN: 4000.0000 [IU] | INTRAMUSCULAR | Status: DC

## 2019-05-23 MED ORDER — ALBUTEROL SULFATE (2.5 MG/3ML) 0.083% IN NEBU
2.5000 mg | INHALATION_SOLUTION | RESPIRATORY_TRACT | Status: DC | PRN
  Administered 2019-05-23: 2.5 mg via RESPIRATORY_TRACT
  Filled 2019-05-23: qty 3

## 2019-05-23 MED ORDER — IPRATROPIUM-ALBUTEROL 0.5-2.5 (3) MG/3ML IN SOLN
3.0000 mL | Freq: Three times a day (TID) | RESPIRATORY_TRACT | Status: DC
  Administered 2019-05-23 – 2019-05-24 (×3): 3 mL via RESPIRATORY_TRACT
  Filled 2019-05-23 (×3): qty 3

## 2019-05-23 NOTE — Progress Notes (Signed)
Hemodialysis initiated via L AVF without issue using 15g needles x2. Patient currently without complaints. States "I feel fine now." Lungs clear/diminished. Sats 99% on 3L [place]. Patient denies dyspnea with exertion. UF goal 2L as ordered. Continue to monitor.

## 2019-05-23 NOTE — Progress Notes (Signed)
VP continues to increase. Some clotting noted in venous chamber. BFR <200 due to high VP. All blood was returned to avoid system clotting/blood loss. Dr. Holley Raring notified. Patient currently without complaints. Able to UF 1.5L. Patient to Radiology post treatment. Report called to primary RN.

## 2019-05-23 NOTE — Progress Notes (Signed)
[facility]  ROUNDING NOTE   Subjective:  Patient known to [place] from prior admission. Came in yesterday with cough, shortness of breath, and weakness. He is also had some twitching. He missed dialysis on Saturday. He completed dialysis early this a.m. He is due for his regular session today as well.   Objective:  Vital signs in last 24 hours:  Temp:  [98 F (36.7 C)-100.5 F (38.1 C)] 100.5 F (38.1 C) (10/13 0812) Pulse Rate:  [68-128] 89 (10/13 1150) Resp:  [18-28] (P) 28 (10/13 0812) BP: (131-247)/(53-176) 135/76 (10/13 1150) SpO2:  [92 %-100 %] 100 % (10/13 1150) Weight:  [117.9 kg-121.8 kg] 121.8 kg (10/13 0300)  Weight change:  Filed Weights   05/22/19 2225 05/23/19 0254 05/23/19 0300  Weight: 119.2 kg 121.8 kg 121.8 kg    Intake/Output: I/O last 3 completed shifts: In: 437.8 [P.O.:240; IV Piggyback:197.8] Out: 1972 [Other:1972]   Intake/Output this shift:  No intake/output data recorded.  Physical Exam: General: No acute distress  Head: Normocephalic, atraumatic. Moist oral mucosal membranes  Eyes: Anicteric  Neck: Supple, trachea midline  Lungs:  Scattered rhonchi, normal effort  Heart: S1S2 no rubs  Abdomen:  Soft, nontender, bowel sounds present  Extremities: 1+ peripheral edema.  Neurologic: Awake, alert, following commands, muscular twitching noted.  Skin: No lesions  Access: LUE AV access    Basic Metabolic Panel: Recent Labs  Lab 05/22/19 1529 05/23/19 0721  NA 139 138  K 6.5* 4.7  CL 99 97*  CO2 23 21*  GLUCOSE 145* 122*  BUN 115* 81*  CREATININE 19.86* 13.67*  CALCIUM 8.1* 8.3*    Liver Function Tests: No results for input(s): AST, ALT, ALKPHOS, BILITOT, PROT, ALBUMIN in the last 168 hours. No results for input(s): LIPASE, AMYLASE in the last 168 hours. No results for input(s): AMMONIA in the last 168 hours.  CBC: Recent Labs  Lab 05/22/19 1529 05/23/19 0721  WBC 10.2 10.6*  HGB 9.9* 9.3*  HCT 30.6* 28.5*  MCV  98.7 96.9  PLT 185 165    Cardiac Enzymes: No results for input(s): CKTOTAL, CKMB, CKMBINDEX, TROPONINI in the last 168 hours.  BNP: Invalid input(s): POCBNP  CBG: Recent Labs  Lab 05/22/19 2233 05/23/19 0807 05/23/19 0958 05/23/19 1134  GLUCAP 115* 118* 104* 100*    Microbiology: Results for orders placed or performed during the hospital encounter of 05/22/19  SARS Coronavirus 2 by RT PCR (hospital order, performed in [facility] hospital lab) Nasopharyngeal Nasopharyngeal Swab     Status: None   Collection Time: 05/22/19  5:21 PM   Specimen: Nasopharyngeal Swab  Result Value Ref Range Status   SARS Coronavirus 2 NEGATIVE NEGATIVE Final    Comment: (NOTE) If result is NEGATIVE SARS-CoV-2 target nucleic acids are NOT DETECTED. The SARS-CoV-2 RNA is generally detectable in upper and lower  respiratory specimens during the acute phase of infection. The lowest  concentration of SARS-CoV-2 viral copies this assay can detect is 250  copies / mL. A negative result does not preclude SARS-CoV-2 infection  and should not be used as the sole basis for treatment or other  patient management decisions.  A negative result may occur with  improper specimen collection / handling, submission of specimen other  than nasopharyngeal swab, presence of viral mutation(s) within the  areas targeted by this assay, and inadequate number of viral copies  (<250 copies / mL). A negative result must be combined with clinical  observations, patient history, and epidemiological information. If  result is POSITIVE SARS-CoV-2 target nucleic acids are DETECTED. The SARS-CoV-2 RNA is generally detectable in upper and lower  respiratory specimens dur ing the acute phase of infection.  Positive  results are indicative of active infection with SARS-CoV-2.  Clinical  correlation with patient history and other diagnostic information is  necessary to determine patient infection status.  Positive results do   not rule out bacterial infection or co-infection with other viruses. If result is PRESUMPTIVE POSTIVE SARS-CoV-2 nucleic acids MAY BE PRESENT.   A presumptive positive result was obtained on the submitted specimen  and confirmed on repeat testing.  While 2019 novel coronavirus  (SARS-CoV-2) nucleic acids may be present in the submitted sample  additional confirmatory testing may be necessary for epidemiological  and / or clinical management purposes  to differentiate between  SARS-CoV-2 and other Sarbecovirus currently known to infect humans.  If clinically indicated additional testing with an alternate test  methodology [phone]) is advised. The SARS-CoV-2 RNA is generally  detectable in upper and lower respiratory sp ecimens during the acute  phase of infection. The expected result is Negative. Fact Sheet for Patients:  StrictlyIdeas.no Fact Sheet for Healthcare Providers: BankingDealers.co.za This test is not yet approved or cleared by the [place] FDA and has been authorized for detection and/or diagnosis of SARS-CoV-2 by FDA under an Emergency Use Authorization (EUA).  This EUA will remain in effect (meaning this test can be used) for the duration of the COVID-19 declaration under Section 564(b)(1) of the Act, 21 U.S.C. section 360bbb-3(b)(1), unless the authorization is terminated or revoked sooner. Performed at [facility], [address] [place], [place] [address]   Blood culture (routine x 2)     Status: None (Preliminary result)   Collection Time: 05/22/19  7:06 PM   Specimen: BLOOD  Result Value Ref Range Status   Specimen Description BLOOD BLOOD RIGHT HAND  Final   Special Requests   Final    BOTTLES DRAWN AEROBIC AND ANAEROBIC Blood Culture adequate volume   Culture   Final    NO GROWTH < 12 HOURS Performed at [facility], [address] [place], [place] [address]    Report Status PENDING   Incomplete  Blood culture (routine x 2)     Status: None (Preliminary result)   Collection Time: 05/22/19  7:06 PM   Specimen: BLOOD  Result Value Ref Range Status   Specimen Description BLOOD RIGHT ANTECUBITAL  Final   Special Requests   Final    BOTTLES DRAWN AEROBIC AND ANAEROBIC Blood Culture adequate volume   Culture   Final    NO GROWTH < 12 HOURS Performed at [facility], [address] [place], [place] [address]    Report Status PENDING  Incomplete    Coagulation Studies: No results for input(s): LABPROT, INR in the last 72 hours.  Urinalysis: No results for input(s): COLORURINE, LABSPEC, PHURINE, GLUCOSEU, HGBUR, BILIRUBINUR, KETONESUR, PROTEINUR, UROBILINOGEN, NITRITE, LEUKOCYTESUR in the last 72 hours.  Invalid input(s): APPERANCEUR    Imaging: Dg Chest 2 View  Result Date: 05/22/2019 CLINICAL DATA:  Cough. EXAM: CHEST - 2 VIEW COMPARISON:  12/25/2013 and 11/19/2013 FINDINGS: There is a hazy left perihilar infiltrate. Bilateral peribronchial thickening. Slight interstitial infiltrate in the right lung. Heart size and pulmonary vascularity are within normal limits. No effusions. No acute bone abnormality. IMPRESSION: 1. Hazy infiltrate in the left perihilar region. 2. Slight interstitial infiltrate in the right lung. 3. Bronchitic changes. Electronically Signed  By: Lorriane Shire M.D.   On: 05/22/2019 15:19   Dg Chest Port 1 View  Result Date: 05/23/2019 CLINICAL DATA:  Acute respiratory failure and wheezing. The patient missed his last dialysis. EXAM: PORTABLE CHEST 1 VIEW COMPARISON:  Single-view of the chest 05/22/2019 and 12/25/2013. FINDINGS: Hazy airspace opacity in left lower lung zone is unchanged. There is mild interstitial edema. No pneumothorax or pleural effusion. IMPRESSION: No change in mild, hazy airspace opacity in left lung base. No change in mild interstitial edema. Electronically Signed   By: Inge Rise M.D.   On: 05/23/2019 10:57      Medications:    . amLODipine  10 mg Oral Daily  . aspirin EC  81 mg Oral Daily  . budesonide (PULMICORT) nebulizer solution  0.5 mg Nebulization BID  . calcium acetate  1,334 mg Oral TID WC  . calcium carbonate  2 tablet Oral QHS  . ferric citrate  210 mg Oral TID WC  . gabapentin  300 mg Oral TID  . insulin aspart  0-5 Units Subcutaneous QHS  . insulin aspart  0-9 Units Subcutaneous TID WC  . insulin detemir  20 Units Subcutaneous QHS  . ipratropium-albuterol  3 mL Nebulization TID  . lanthanum  1,000 mg Oral TID WC  . levofloxacin  500 mg Oral Q48H  . lisinopril  40 mg Oral Daily  . metoprolol succinate  50 mg Oral Daily  . multivitamin  1 tablet Oral Once per day on Mon Wed Fri  . rOPINIRole  1 mg Oral TID   acetaminophen, albuterol, labetalol, oxyCODONE-acetaminophen  Assessment/ Plan:  63 y.o. male with past medical history of ESRD on HD TTS, diabetes mellitus type 2, hypertension, anemia chronic kidney disease, secondary hyperparathyroidism who was admitted with fever, cough, shortness of breath after having missed dialysis on Saturday.  [facility]/[address]/TTHS/EDW 114  1.  ESRD on HD TTS.  Patient missed dialysis on Saturday.  We did perform dialysis and completed this early this a.m.  To get him back on his schedule we will perform dialysis later today as well.  Orders been prepared.  2.  Hyperkalemia.  Serum potassium was greater than 6 upon admission.  Now improved.  We plan to perform additional dialysis treatment as above.  3.  Anemia of chronic kidney disease.  Hemoglobin currently 9.3.  Start the patient on Epogen 4000 units IV with dialysis.  4.  Secondary hyperparathyroidism.  Check serum phosphorus today.  Otherwise maintain the patient on [place].   LOS: 1 Ramesses Crampton 10/13/202012:17 PM

## 2019-05-23 NOTE — Progress Notes (Signed)
Pre HD:    05/23/19 0254  Vital Signs  Temp 98.8 F (37.1 C)  Temp Source Oral  Pulse Rate 92  Pulse Rate Source Dinamap  Resp (!) 26  BP (!) 220/97  BP Location Right Arm  BP Method Automatic  Patient Position (if appropriate) Lying  Oxygen Therapy  SpO2 99 %  O2 Device Nasal Cannula  O2 Flow Rate (L/min) 3 L/min  Dialysis Weight  Weight 120.8 kg  Type of Weight Pre-Dialysis  Time-Out for Hemodialysis  What Procedure? HD  Pt Identifiers(min of two) First/Last Name;MRN/Account#;Pt's DOB(use if MRN/Acct# not available  Correct Site? Yes  Correct Side? Yes  Correct Procedure? Yes  Consents Verified? Yes  Rad Studies Available? N/A  Safety Precautions Reviewed? Yes  Engineer, civil (consulting) Number 2  Station Number 1  UF/Alarm Test Passed  Conductivity: Meter 13.8  Conductivity: Machine  14.1  pH 7.2  Reverse Osmosis WRO#4  Normal Saline Lot Number [id number]  Dialyzer Lot Number [id number]  Disposable Set Lot Number [id number]  Machine Temperature 98.6 F (37 C)  Musician and Audible Yes  Blood Lines Intact and Secured Yes  Pre Treatment Patient Checks  Vascular access used during treatment Fistula  HD catheter dressing before treatment  (n/a)  Patient is receiving dialysis in a chair  (no)  Hepatitis B Surface Antigen Results Pending  Isolation Initiated Yes  Hepatitis B Surface Antibody  (unknown)  Date Hepatitis B Surface Antibody Drawn 05/23/19  Hemodialysis Consent Verified Yes  Hemodialysis Standing Orders Initiated Yes  ECG (Telemetry) Monitor On Yes  Prime Ordered Normal Saline  Length of  DialysisTreatment -hour(s) 3 Hour(s)  Dialyzer Elisio 17H NR  Dialysate 1K;2K;2.5 Ca  Dialysate Flow Ordered 800  Blood Flow Rate Ordered 400 mL/min  Ultrafiltration Goal 2 Liters  Dialysis Blood Pressure Support Ordered Normal Saline  Education / Care Plan  Dialysis Education Provided Yes  Documented Education in Care Plan Yes  Outpatient Plan of Care  Reviewed and on Chart Yes

## 2019-05-23 NOTE — Progress Notes (Signed)
*  PRELIMINARY RESULTS* Echocardiogram 2D Echocardiogram has been performed.  Donald Silva 05/23/2019, 12:07 PM

## 2019-05-23 NOTE — Progress Notes (Signed)
HD Tx Completed:    05/23/19 0638  Vital Signs  Temp 98 F (36.7 C)  Temp Source Oral  Pulse Rate 99  Pulse Rate Source Dinamap  Resp (!) 23  BP (!) 170/107  BP Location Right Arm  BP Method Automatic  Patient Position (if appropriate) Lying  Oxygen Therapy  SpO2 100 %  O2 Device Nasal Cannula  O2 Flow Rate (L/min) 4 L/min  Pain Assessment  Pain Scale 0-10  Pain Score 0  During Hemodialysis Assessment  KECN 54.4 KECN  Dialysis Fluid Bolus Normal Saline  Bolus Amount (mL) 250 mL  Intra-Hemodialysis Comments Tx completed;Tolerated well

## 2019-05-23 NOTE — Progress Notes (Signed)
Post HD Assessment:    05/23/19 0646  Neurological  Level of Consciousness Alert  Orientation Level Oriented X4  Respiratory  Respiratory Pattern Labored;Dyspnea with exertion  Chest Assessment Chest expansion symmetrical  Bilateral Breath Sounds Coarse crackles  Vascular  R Radial Pulse +3  L Radial Pulse +3  Integumentary  Integumentary (WDL) X  Musculoskeletal  Musculoskeletal (WDL) X  Gastrointestinal  Bowel Sounds Assessment Active  Psychosocial  Psychosocial (WDL) X  Patient Behaviors Cooperative

## 2019-05-23 NOTE — Progress Notes (Addendum)
Jonny Ruiz, NP notified about patients breathing and blood pressure. Will continue to monitor.   New orders entered and to be carried out. Charge nurse contacted the dialysis nurse on call to confirm that pt. will be dialyzed tonight. Pt. Informed about orders. Will continue to monitor.

## 2019-05-23 NOTE — Progress Notes (Signed)
   05/23/19 1000  Clinical Encounter Type  Visited With Health care provider  Visit Type Initial;Spiritual support  Referral From Nurse  Consult/Referral To Chaplain  Chaplain received page for RR for patient. Chaplain arrived and made pastoral presences known with silent prayer. Chaplain made sure she was not needed before leaving.

## 2019-05-23 NOTE — Progress Notes (Signed)
Patient is breathing, but very lethargic and SOB. Called RR.   Fuller Mandril, RN

## 2019-05-23 NOTE — Progress Notes (Signed)
Per Dr. Stark Jock order STAT EKG and Troponin's.   Fuller Mandril, RN

## 2019-05-23 NOTE — Progress Notes (Signed)
Post HD Tx:    05/23/19 0645  Vital Signs  Temp 98 F (36.7 C)  Temp Source Oral  Pulse Rate 99  Pulse Rate Source Dinamap  Resp (!) 24  BP (!) 175/93  BP Location Right Arm  BP Method Automatic  Patient Position (if appropriate) Lying  Oxygen Therapy  SpO2 100 %  O2 Device Nasal Cannula  O2 Flow Rate (L/min) 4 L/min  Pain Assessment  Pain Scale 0-10  Pain Score 0  Post-Hemodialysis Assessment  Rinseback Volume (mL) 250 mL  KECN 54.4 V  Dialyzer Clearance Lightly streaked  Duration of HD Treatment -hour(s) 3 hour(s)  Hemodialysis Intake (mL) 700 mL  UF Total -Machine (mL) 2672 mL  Net UF (mL) 1972 mL  Tolerated HD Treatment Yes  AVG/AVF Arterial Site Held (minutes)  (15)  AVG/AVF Venous Site Held (minutes) 15 minutes

## 2019-05-23 NOTE — Progress Notes (Signed)
Pre HD Assessment:    05/23/19 0255  Neurological  Level of Consciousness Alert  Orientation Level Oriented X4  Respiratory  Respiratory Pattern Labored;Dyspnea with exertion;Dyspnea at rest  Chest Assessment Chest expansion symmetrical  Bilateral Breath Sounds Coarse crackles  Vascular  R Radial Pulse +3  L Radial Pulse +3  Integumentary  Integumentary (WDL) X  Musculoskeletal  Musculoskeletal (WDL) X  Gastrointestinal  Bowel Sounds Assessment Active  Last BM Date 05/23/19  Psychosocial  Psychosocial (WDL) X  Patient Behaviors Cooperative

## 2019-05-23 NOTE — Progress Notes (Signed)
Pt VS BP 136/95, T 100.5, P 128, R 28. Patient is having labored breathing but is alert and oriented. Patient is currently on 4L O2, SpO2 100%. Giving patient scheduled BP medications. Begin q15 min VS and monitor patient.   Fuller Mandril, RN

## 2019-05-23 NOTE — Progress Notes (Signed)
Pharmacy Antibiotic Note  Donald Silva is a 63 y.o. male admitted on 05/22/2019. Pharmacy has been consulted for vancomycin dosing for wound infection and levofloxacin for CAP.  Patient is a dialysis patient and has missed several sessions.  Plan: Vancomycin 2000 mg IV x 1. Dose was delayed and given 0930 this AM. Patient will receive hemodialysis tonight. Will not order supplemental dose for tonight and will order a random vancomycin level at 1200 tomorrow.  Levofloxacin 750 mg IV x 1 given. Will order levofloxacin 500 mg PO q48h to start today. Will adjust according to dialysis schedule.  Height: 5\' 10"  (177.8 cm) Weight: 268 lb 8.3 oz (121.8 kg) IBW/kg (Calculated) : 73  Temp (24hrs), Avg:98.7 F (37.1 C), Min:98 F (36.7 C), Max:100.5 F (38.1 C)  Recent Labs  Lab 05/22/19 1529 05/23/19 0721  WBC 10.2 10.6*  CREATININE 19.86* 13.67*    Estimated Creatinine Clearance: 7 mL/min (A) (by C-G formula based on SCr of 13.67 mg/dL (H)).    Allergies  Allergen Reactions  . Penicillins Anaphylaxis    Was told it could kill him, pt unsure of reaction  Has patient had a PCN reaction causing immediate rash, facial/tongue/throat swelling, SOB or lightheadedness with hypotension: No Has patient had a PCN reaction causing severe rash involving mucus membranes or skin necrosis: No Has patient had a PCN reaction that required hospitalization: No Has patient had a PCN reaction occurring within the last 10 years: No If all of the above answers are "NO", then may proceed with Cephalosporin use.   . Cyclopentolate Other (See Comments)    Antimicrobials this admission: Levofloxacin 10/12 >> Vancomycin 10/12 >>  Dose adjustments this admission: NA  Microbiology results: 10/12 BCx: pending  Thank you for allowing pharmacy to be a part of this patient's care.  Pearla Dubonnet, PharmD 05/23/2019 1:35 PM

## 2019-05-23 NOTE — Progress Notes (Signed)
Mid Tx Note:  Spoke with on call Altamese Cabal, NP regarding pt elevated BP. PRN labetalol 10 mg given prior to call. Per NP, call back about 0615 for alternative if BP has not decreased.    05/23/19 0518  Vital Signs  BP (!) 213/126

## 2019-05-23 NOTE — Progress Notes (Signed)
Pt is off the unit to dialysis.  

## 2019-05-23 NOTE — Progress Notes (Signed)
Pt removed from tx to go to bathroom, offered bedpan but refused. Circuit recirculated but clotted d/t time toileting. Tx restarted, pt O2 increased to 4L/min post return from bathroom. SpO2 now 100% SBP still very elevated. Will continue to monitor for downward trend or notify MD.    05/23/19 0400  Vital Signs  Pulse Rate (!) 104  Resp (!) 22  BP (!) 247/118  Oxygen Therapy  SpO2 100 %  O2 Device Nasal Cannula  O2 Flow Rate (L/min) 4 L/min  During Hemodialysis Assessment  Blood Flow Rate (mL/min) 325 mL/min  Arterial Pressure (mmHg) -190 mmHg  Venous Pressure (mmHg) 220 mmHg  Transmembrane Pressure (mmHg) 90 mmHg  Ultrafiltration Rate (mL/min) 900 mL/min  Dialysate Flow Rate (mL/min) 800 ml/min  Conductivity: Machine  14  HD Safety Checks Performed Yes  Intra-Hemodialysis Comments Progressing as prescribed (tx restarted, circuit chaged d/t clotting while pt in bathro)

## 2019-05-23 NOTE — Progress Notes (Signed)
Established hemodialysis patient known at [facility] TTS 6:00. Patient normally drives self to treatments. Please contact me directly with dialysis placement concerns.  Elvera Bicker Dialysis Coordinator [phone]

## 2019-05-23 NOTE — Significant Event (Signed)
Rapid Response Event Note  Overview:  Page received at [phone] to respond to room 220. Arrived to room at 0958. Pt in bed, lethargic, hooked up to floor's dinamap, on [place]. Per patient's RN Misty, pt had gone to dialysis that morning and returned early in the shift, approximately 0630 or 0700. He had been alert and oriented, appropriate upon return. During dialysis, 2.5L was removed.     Initial Focused Assessment: Pt in bed, lethargic, minimally responsive with blood pressure of 181/92, HR 101, O2 sats 100% on 4LNC, 24 bpm. Lung sounds with expiratory wheezing and rhonchi. During assessment, pt aroused, and answered questions appropriately, then asked where his breakfast had been taken. After this, pt remained alert and oriented. New breakfast ordered for patient and patient eating without difficulties  Interventions:  Pt given previously ordered PRN labetalol 10 mg IVP and albuterol breathing treatment. CXR ordered as well as ABG. After PRN labetalol, HR 89, BP 181/92, 20 bpm, sats 99% on 4LNC Plan of Care (if not transferred):  Pt remained in room with floor RN. Floor RN instructed to call this RN with any concerns, or call RR if patient deteriorated.   Event Summary:   at      at          [facility]

## 2019-05-23 NOTE — Progress Notes (Signed)
[place] [facility] [place] NAME: Lucio Litsey    MR#:  [id number]  DATE OF BIRTH:  02-Mar-1956  SUBJECTIVE:  CHIEF COMPLAINT:   Chief Complaint  Patient presents with   Cough   Patient had hemodialysis overnight.  This morning patient was noted to be very sleepy and lethargic.  Rapid response was called.  Patient later woke up and responded appropriately to my questions.  Imaging of the head was negative.  REVIEW OF SYSTEMS:  ROS unobtainable due to patient being very sleepy this morning.  DRUG ALLERGIES:   Allergies  Allergen Reactions   Penicillins Anaphylaxis    Was told it could kill him, pt unsure of reaction  Has patient had a PCN reaction causing immediate rash, facial/tongue/throat swelling, SOB or lightheadedness with hypotension: No Has patient had a PCN reaction causing severe rash involving mucus membranes or skin necrosis: No Has patient had a PCN reaction that required hospitalization: No Has patient had a PCN reaction occurring within the last 10 years: No If all of the above answers are "NO", then may proceed with Cephalosporin use.    Cyclopentolate Other (See Comments)   VITALS:  Blood pressure 136/70, pulse 87, temperature 98.2 F (36.8 C), temperature source Oral, resp. rate 20, height '5\' 10"'  (1.778 m), weight 121.8 kg, SpO2 99 %. PHYSICAL EXAMINATION:  Physical Exam  GENERAL:  63 y.o.-year-old patient lying in the bed with no acute distress.  EYES: Pupils equal, round, reactive to light and accommodation. No scleral icterus. Extraocular muscles intact.  HEENT: Head atraumatic, normocephalic. Oropharynx and nasopharynx clear.  NECK:  Supple, no jugular venous distention. No thyroid enlargement, no tenderness.  LUNGS: Decreased breath sounds bilaterally, upper airway expiratory wheezing.  Positive rales at the bases.  Light use of accessory muscles of respiration.  CARDIOVASCULAR: S1, S2 normal. No murmurs, rubs, or  gallops.  ABDOMEN: Soft, nontender, nondistended. Bowel sounds present. No organomegaly or mass.  EXTREMITIES: 2+ pedal edema, no cyanosis, or clubbing.  NEUROLOGIC: Generalized weakness. Sensation intact. Gait not checked.  PSYCHIATRIC: The patient is alert and oriented x 3.  SKIN: Right foot open wound at amputation site with some drainage.  Dressing in place  LABORATORY PANEL:  Male CBC Recent Labs  Lab 05/23/19 0721  WBC 10.6*  HGB 9.3*  HCT 28.5*  PLT 165   ------------------------------------------------------------------------------------------------------------------ Chemistries  Recent Labs  Lab 05/23/19 0721  NA 138  K 4.7  CL 97*  CO2 21*  GLUCOSE 122*  BUN 81*  CREATININE 13.67*  CALCIUM 8.3*   RADIOLOGY:  Ct Head Wo Contrast  Result Date: 05/23/2019 CLINICAL DATA:  Gait disturbance and weakness. Twitching sensations. End-stage renal disease. EXAM: CT HEAD WITHOUT CONTRAST TECHNIQUE: Contiguous axial images were obtained from the base of the skull through the vertex without intravenous contrast. COMPARISON:  None. FINDINGS: Brain: Ventricles and sulci are within normal limits for age. There is no intracranial mass, hemorrhage, extra-axial fluid collection, or midline shift. There is minimal small vessel disease in the centra semiovale bilaterally. No acute infarct evident. Vascular: There is no hyperdense vessel. No vascular calcification evident. Skull: The bony calvarium appears intact. Sinuses/Orbits: There is slight mucosal thickening in several ethmoid air cells. A small bony defect is noted in the medial right orbital wall. Visualized orbits otherwise appear symmetric bilaterally. Other: There is slight opacification in inferior mastoid air cells on the right. Other visualized mastoid air cells bilaterally are clear. Although incompletely visualized, there is  apparent advanced arthropathy in the right temporomandibular joint. IMPRESSION: 1. Slight periventricular  small vessel disease. No acute infarct. No mass or hemorrhage. 2. 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 3.  Mild mucosal thickening in several ethmoid air cells. 4. Opacification in several inferior mastoid air cells on the right. Electronically Signed   By: Lowella Grip III M.D.   On: 05/23/2019 13:57   Dg Chest Port 1 View  Result Date: 05/23/2019 CLINICAL DATA:  Acute respiratory failure and wheezing. The patient missed his last dialysis. EXAM: PORTABLE CHEST 1 VIEW COMPARISON:  Single-view of the chest 05/22/2019 and 12/25/2013. FINDINGS: Hazy airspace opacity in left lower lung zone is unchanged. There is mild interstitial edema. No pneumothorax or pleural effusion. IMPRESSION: No change in mild, hazy airspace opacity in left lung base. No change in mild interstitial edema. Electronically Signed   By: Inge Rise M.D.   On: 05/23/2019 10:57   ASSESSMENT AND PLAN:  1.  Acute fluid overload with missed dialysis.   Patient was dialyzed last night.  Nephrology service following.   2.  Acute hypoxic respiratory failure.  Patient pulse ox of 88% on room air.  Patient placed on 2 L of oxygen. This is likely due to fluid overload from missed hemodialysis. Was later notified by nursing staff that patient had episode of decreased responsiveness.  Rapid response was called.  When I saw the patient patient woke up with some mild sternal rub.  He responded to questions appropriately.  Appeared to have been very sleepy from being awake most of the night.  CT of the head done was negative. Cardiac enzymes minimally elevated likely due to fluid overload.  Twelve-lead EKG done which I reviewed with the cardiologist on-call Dr. Saunders Revel.  EKG does not meet criteria for STEMI. 3.  Hyperkalemia and twitching.   Treated with hemodialysis and resolved.   4.    Left lower lung pneumonia.  Patient placed on Levaquin and vancomycin per ID.[name]  We will give  nebulizers DuoNeb and budesonide.  Monitor clinically 5.  Postoperative infection of right foot amputation site.   Patient on broad-spectrum IV antibiotics with vancomycin and Levaquin.   Noted elevated ESR of 60.   Podiatry consult placed to assist with further evaluation and management.   6.  End-stage renal disease.  Continue inpatient hemodialysis  7.  Type 2 diabetes mellitus.  Continue sliding scale insulin coverage and Levemir insulin.  Monitor blood sugars and adjust as needed. 8.  Hypertension.  Continue usual medications  DVT prophylaxis; placed on subcu heparin  All the records are reviewed and case discussed with Care Management/Social Worker. Management plans discussed with the patient, family and they are in agreement.  CODE STATUS: Prior  TOTAL TIME TAKING CARE OF THIS PATIENT: 35 minutes.   More than 50% of the time was spent in counseling/coordination of care: YES  POSSIBLE D/C IN 2 DAYS, DEPENDING ON CLINICAL CONDITION.   Sameena Artus M.D on 05/23/2019 at 3:32 PM  Between 7am to 6pm - Pager - [phone]  After 6pm go to [email or web address] - Proofreader  [facility]  Office  [phone]  CC: Primary care physician; Letta Median, MD  Note: This dictation was prepared with Dragon dictation along with smaller phrase technology. Any transcriptional errors that result from this process are unintentional.

## 2019-05-24 LAB — MAGNESIUM: Magnesium: 2.3 mg/dL (ref 1.7–2.4)

## 2019-05-24 LAB — CBC
HCT: 28.6 % — ABNORMAL LOW (ref 39.0–52.0)
Hemoglobin: 9.3 g/dL — ABNORMAL LOW (ref 13.0–17.0)
MCH: 31.8 pg (ref 26.0–34.0)
MCHC: 32.5 g/dL (ref 30.0–36.0)
MCV: 97.9 fL (ref 80.0–100.0)
Platelets: 153 10*3/uL (ref 150–400)
RBC: 2.92 MIL/uL — ABNORMAL LOW (ref 4.22–5.81)
RDW: 13.5 % (ref 11.5–15.5)
WBC: 6.3 10*3/uL (ref 4.0–10.5)
nRBC: 0 % (ref 0.0–0.2)

## 2019-05-24 LAB — BASIC METABOLIC PANEL
Anion gap: 17 — ABNORMAL HIGH (ref 5–15)
BUN: 63 mg/dL — ABNORMAL HIGH (ref 8–23)
CO2: 24 mmol/L (ref 22–32)
Calcium: 8.3 mg/dL — ABNORMAL LOW (ref 8.9–10.3)
Chloride: 99 mmol/L (ref 98–111)
Creatinine, Ser: 12.56 mg/dL — ABNORMAL HIGH (ref 0.61–1.24)
GFR calc Af Amer: 4 mL/min — ABNORMAL LOW (ref >60)
GFR calc non Af Amer: 4 mL/min — ABNORMAL LOW (ref >60)
Glucose, Bld: 85 mg/dL (ref 70–99)
Potassium: 4.9 mmol/L (ref 3.5–5.1)
Sodium: 140 mmol/L (ref 135–145)

## 2019-05-24 LAB — VANCOMYCIN, RANDOM: Vancomycin Rm: 14

## 2019-05-24 LAB — HEPATITIS B SURFACE ANTIBODY, QUANTITATIVE: Hep B S AB Quant (Post): >1000 m[IU]/mL (ref >9.9)

## 2019-05-24 LAB — GLUCOSE, CAPILLARY
Glucose-Capillary: 86 mg/dL (ref 70–99)
Glucose-Capillary: 121 mg/dL — ABNORMAL HIGH (ref 70–99)

## 2019-05-24 LAB — PHOSPHORUS: Phosphorus: 7.4 mg/dL — ABNORMAL HIGH (ref 2.5–4.6)

## 2019-05-24 MED ORDER — LEVOFLOXACIN 500 MG PO TABS: 500.0000 mg | ORAL_TABLET | ORAL | 0 refills | Status: AC

## 2019-05-24 MED ORDER — LEVOFLOXACIN 500 MG PO TABS: 500.0000 mg | ORAL_TABLET | ORAL | 0 refills | Status: DC

## 2019-05-24 NOTE — Plan of Care (Signed)
Pt remain on telemetry, no issues so far, denies chest pain and shortness of breath ambulated to the hallway with moderate assistance, food ordered from the kitchen, Pt happy consumption 100 %, wound dressing to the LLE changed tonight Pt was okay to assess even though he had refused during the previous day assessment, VSS, O2 Sats on RA Spotcheck done Pt Sating at 96%. Will continue to monitor closely.  Problem: Education: Goal: Knowledge of General Education information will improve Description: Including pain rating scale, medication(s)/side effects and non-pharmacologic comfort measures Outcome: Progressing   Problem: Health Behavior/Discharge Planning: Goal: Ability to manage health-related needs will improve Outcome: Progressing   Problem: Clinical Measurements: Goal: Ability to maintain clinical measurements within normal limits will improve Outcome: Progressing Goal: Will remain free from infection Outcome: Progressing Goal: Diagnostic test results will improve Outcome: Progressing Goal: Respiratory complications will improve Outcome: Progressing Goal: Cardiovascular complication will be avoided Outcome: Progressing   Problem: Activity: Goal: Risk for activity intolerance will decrease Outcome: Progressing   Problem: Nutrition: Goal: Adequate nutrition will be maintained Outcome: Progressing   Problem: Coping: Goal: Level of anxiety will decrease Outcome: Progressing   Problem: Elimination: Goal: Will not experience complications related to bowel motility Outcome: Progressing Goal: Will not experience complications related to urinary retention Outcome: Progressing   Problem: Pain Managment: Goal: General experience of comfort will improve Outcome: Progressing   Problem: Safety: Goal: Ability to remain free from injury will improve Outcome: Progressing   Problem: Skin Integrity: Goal: Risk for impaired skin integrity will decrease Outcome: Progressing

## 2019-05-24 NOTE — Discharge Summary (Signed)
[facility] [place] NAME: Donald Silva    MR#:  [id number]  DATE OF BIRTH:  05-27-1956  DATE OF ADMISSION:  05/22/2019 ADMITTING PHYSICIAN: Loletha Grayer, MD  DATE OF DISCHARGE: 05/24/2019  PRIMARY CARE PHYSICIAN: Letta Median, MD    ADMISSION DIAGNOSIS:  Shortness of breath [R06.02] Hyperkalemia [E87.5] Hypoxia [R09.02]  DISCHARGE DIAGNOSIS:  Active Problems: Acute fluid overload due to missing dialysis   SECONDARY DIAGNOSIS:   Past Medical History:  Diagnosis Date  . CKD (chronic kidney disease) stage 5, GFR less than 15 ml/min (HCC)   . Diabetes mellitus without complication ([place])   . Dyspnea   . GERD (gastroesophageal reflux disease)   . Hypertension   . Lower extremity edema   . Myocardial infarction [facility])     HOSPITAL COURSE:   63 year old male with end-stage renal disease on dialysis and diabetes who presented to the emergency room due to shortness of breath.  1.  Acute hypoxic respiratory failure in the setting of acute fluid overload due to missing dialysis: Patient has been weaned off oxygen.  2.  Fluid overload due to missing dialysis: Patient is euvolemic.  Patient will resume his regular dialysis schedule.  3.  Left lower lobe pneumonia: Patient will finish course of treatment on Levaquin.  Levaquin has been renally dosed as per pharmacy.   4.  End-stage renal disease on hemodialysis: Continue outpatient dialysis schedule   5.  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  He will have outpatient follow-up with podiatry in 1 to 2 days. DISCHARGE CONDITIONS AND DIET:   Stable renal diet  CONSULTS OBTAINED:  Treatment Team:  Caroline More, DPM  DRUG ALLERGIES:   Allergies  Allergen Reactions  . Penicillins Anaphylaxis    Was told it could kill him, pt unsure of reaction  Has patient had a PCN reaction  causing immediate rash, facial/tongue/throat swelling, SOB or lightheadedness with hypotension: No Has patient had a PCN reaction causing severe rash involving mucus membranes or skin necrosis: No Has patient had a PCN reaction that required hospitalization: No Has patient had a PCN reaction occurring within the last 10 years: No If all of the above answers are "NO", then may proceed with Cephalosporin use.   . Cyclopentolate Other (See Comments)    DISCHARGE MEDICATIONS:   Allergies as of 05/24/2019      Reactions   Penicillins Anaphylaxis   Was told it could kill him, pt unsure of reaction  Has patient had a PCN reaction causing immediate rash, facial/tongue/throat swelling, SOB or lightheadedness with hypotension: No Has patient had a PCN reaction causing severe rash involving mucus membranes or skin necrosis: No Has patient had a PCN reaction that required hospitalization: No Has patient had a PCN reaction occurring within the last 10 years: No If all of the above answers are "NO", then may proceed with Cephalosporin use.   Cyclopentolate Other (See Comments)      Medication List    STOP taking these medications   doxycycline 100 MG tablet Commonly known as: VIBRA-TABS   oxyCODONE-acetaminophen 5-325 MG tablet Commonly known as: Percocet     TAKE these medications   albuterol 108 (90 Base) MCG/ACT inhaler Commonly known as: VENTOLIN HFA Inhale 1-2 puffs into the lungs every 6 (six) hours as needed for wheezing or shortness of breath.   amLODipine 10 MG tablet Commonly known as:  NORVASC Take 10 mg by mouth daily.   aspirin EC 81 MG tablet Take 81 mg daily by mouth.   Auryxia 1 GM 210 MG(Fe) tablet Generic drug: ferric citrate Take 210 mg by mouth 3 (three) times daily with meals.   b complex-vitamin c-folic acid 0.8 MG Tabs tablet Take 1 tablet by mouth every Monday, Wednesday, and Friday. After dialysis treatment   calcium acetate 667 MG capsule Commonly known  as: PHOSLO Take 1,334 mg by mouth 3 (three) times daily with meals.   calcium carbonate 750 MG chewable tablet Commonly known as: TUMS EX Chew 2 tablets by mouth at bedtime.   clindamycin 300 MG capsule Commonly known as: Cleocin Take 1 capsule (300 mg total) by mouth 3 (three) times daily.   gabapentin 300 MG capsule Commonly known as: NEURONTIN Take 300 mg by mouth 3 (three) times daily.   gentamicin cream 0.1 % Commonly known as: GARAMYCIN Apply 1 application topically 2 (two) times daily.   heparin 1000 UNIT/ML injection Heparin Sodium (Porcine) 1,000 Units/mL Systemic   lanthanum 1000 MG chewable tablet Commonly known as: FOSRENOL Chew 1,000 mg by mouth 3 (three) times daily with meals.   Levemir FlexTouch 100 UNIT/ML Pen Generic drug: Insulin Detemir Inject 66 Units into the skin at bedtime.   levofloxacin 500 MG tablet Commonly known as: LEVAQUIN Take 1 tablet (500 mg total) by mouth every other day for 6 days. Start taking on: May 25, 2019   lisinopril 40 MG tablet Commonly known as: ZESTRIL Take 40 mg by mouth daily.   Mapap 325 MG tablet Generic drug: acetaminophen Take 325 mg by mouth every 4 (four) hours as needed for mild pain.   metoprolol succinate 50 MG 24 hr tablet Commonly known as: TOPROL-XL Take 50 mg by mouth daily.   rOPINIRole 1 MG tablet Commonly known as: REQUIP Take 1 mg by mouth 3 (three) times daily.         Today   CHIEF COMPLAINT:  Ready to  Be discharged home   VITAL SIGNS:  Blood pressure 116/70, pulse 80, temperature 98 F (36.7 C), temperature source Oral, resp. rate 20, height 5\' 10"  (1.778 m), weight 118.1 kg, SpO2 90 %.   REVIEW OF SYSTEMS:  Review of Systems  Constitutional: Negative.  Negative for chills, fever and malaise/fatigue.  HENT: Negative.  Negative for ear discharge, ear pain, hearing loss, nosebleeds and sore throat.   Eyes: Negative.  Negative for blurred vision and pain.  Respiratory:  Negative.  Negative for cough, hemoptysis, shortness of breath and wheezing.   Cardiovascular: Negative.  Negative for chest pain, palpitations and leg swelling.  Gastrointestinal: Negative.  Negative for abdominal pain, blood in stool, diarrhea, nausea and vomiting.  Genitourinary: Negative.  Negative for dysuria.  Musculoskeletal: Negative.  Negative for back pain.  Skin: Negative.   Neurological: Negative for dizziness, tremors, speech change, focal weakness, seizures and headaches.  Endo/Heme/Allergies: Negative.  Does not bruise/bleed easily.  Psychiatric/Behavioral: Negative.  Negative for depression, hallucinations and suicidal ideas.     PHYSICAL EXAMINATION:  GENERAL:  63 y.o.-year-old patient lying in the bed with no acute distress.  NECK:  Supple, no jugular venous distention. No thyroid enlargement, no tenderness.  LUNGS: Normal breath sounds bilaterally, no wheezing, rales,rhonchi  No use of accessory muscles of respiration.  CARDIOVASCULAR: S1, S2 normal. No murmurs, rubs, or gallops.  ABDOMEN: Soft, non-tender, non-distended. Bowel sounds present. No organomegaly or mass.  EXTREMITIES: No pedal edema, cyanosis, or clubbing.  PSYCHIATRIC: The patient is alert and oriented x 3.  SKIN: dressing in place right foot  DATA REVIEW:   CBC Recent Labs  Lab 05/24/19 0631  WBC 6.3  HGB 9.3*  HCT 28.6*  PLT 153    Chemistries  Recent Labs  Lab 05/24/19 0631  NA 140  K 4.9  CL 99  CO2 24  GLUCOSE 85  BUN 63*  CREATININE 12.56*  CALCIUM 8.3*  MG 2.3    Cardiac Enzymes No results for input(s): TROPONINI in the last 168 hours.  Microbiology Results  @[email or web address] @  RADIOLOGY:  Dg Chest 2 View  Result Date: 05/22/2019 CLINICAL DATA:  Cough. EXAM: CHEST - 2 VIEW COMPARISON:  12/25/2013 and 11/19/2013 FINDINGS: There is a hazy left perihilar infiltrate. Bilateral peribronchial thickening. Slight interstitial infiltrate in the right lung. Heart size and pulmonary  vascularity are within normal limits. No effusions. No acute bone abnormality. IMPRESSION: 1. Hazy infiltrate in the left perihilar region. 2. Slight interstitial infiltrate in the right lung. 3. Bronchitic changes. Electronically Signed   By: Lorriane Shire M.D.   On: 05/22/2019 15:19   Ct Head Wo Contrast  Result Date: 05/23/2019 CLINICAL DATA:  Gait disturbance and weakness. Twitching sensations. End-stage renal disease. EXAM: CT HEAD WITHOUT CONTRAST TECHNIQUE: Contiguous axial images were obtained from the base of the skull through the vertex without intravenous contrast. COMPARISON:  None. FINDINGS: Brain: Ventricles and sulci are within normal limits for age. There is no intracranial mass, hemorrhage, extra-axial fluid collection, or midline shift. There is minimal small vessel disease in the centra semiovale bilaterally. No acute infarct evident. Vascular: There is no hyperdense vessel. No vascular calcification evident. Skull: The bony calvarium appears intact. Sinuses/Orbits: There is slight mucosal thickening in several ethmoid air cells. A small bony defect is noted in the medial right orbital wall. Visualized orbits otherwise appear symmetric bilaterally. Other: There is slight opacification in inferior mastoid air cells on the right. Other visualized mastoid air cells bilaterally are clear. Although incompletely visualized, there is apparent advanced arthropathy in the right temporomandibular joint. IMPRESSION: 1. Slight periventricular small vessel disease. No acute infarct. No mass or hemorrhage. 2. 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 3.  Mild mucosal thickening in several ethmoid air cells. 4. Opacification in several inferior mastoid air cells on the right. Electronically Signed   By: Lowella Grip III M.D.   On: 05/23/2019 13:57   Dg Chest Port 1 View  Result Date: 05/23/2019 CLINICAL DATA:  Acute respiratory failure and  wheezing. The patient missed his last dialysis. EXAM: PORTABLE CHEST 1 VIEW COMPARISON:  Single-view of the chest 05/22/2019 and 12/25/2013. FINDINGS: Hazy airspace opacity in left lower lung zone is unchanged. There is mild interstitial edema. No pneumothorax or pleural effusion. IMPRESSION: No change in mild, hazy airspace opacity in left lung base. No change in mild interstitial edema. Electronically Signed   By: Inge Rise M.D.   On: 05/23/2019 10:57   Dg Foot 2 Views Right  Result Date: 05/23/2019 CLINICAL DATA:  Wound of the right foot. Previous partial amputation of the first ray. EXAM: RIGHT FOOT - 2 VIEW COMPARISON:  03/31/2019 FINDINGS: There is air in the soft tissues at the site of the wound. The surgical margin of the first metatarsal is sharp. There is some periosteal new bone at the stump of the first metatarsal which is to the expected degree. Previous amputation of the fifth toe. Slight dorsal spurring at the tarsal metatarsal  joints. No other significant abnormalities. IMPRESSION: No evidence of osteomyelitis.  Soft tissue wound. Electronically Signed   By: Lorriane Shire M.D.   On: 05/23/2019 20:43      Allergies as of 05/24/2019      Reactions   Penicillins Anaphylaxis   Was told it could kill him, pt unsure of reaction  Has patient had a PCN reaction causing immediate rash, facial/tongue/throat swelling, SOB or lightheadedness with hypotension: No Has patient had a PCN reaction causing severe rash involving mucus membranes or skin necrosis: No Has patient had a PCN reaction that required hospitalization: No Has patient had a PCN reaction occurring within the last 10 years: No If all of the above answers are "NO", then may proceed with Cephalosporin use.   Cyclopentolate Other (See Comments)      Medication List    STOP taking these medications   doxycycline 100 MG tablet Commonly known as: VIBRA-TABS   oxyCODONE-acetaminophen 5-325 MG tablet Commonly known as:  Percocet     TAKE these medications   albuterol 108 (90 Base) MCG/ACT inhaler Commonly known as: VENTOLIN HFA Inhale 1-2 puffs into the lungs every 6 (six) hours as needed for wheezing or shortness of breath.   amLODipine 10 MG tablet Commonly known as: NORVASC Take 10 mg by mouth daily.   aspirin EC 81 MG tablet Take 81 mg daily by mouth.   Auryxia 1 GM 210 MG(Fe) tablet Generic drug: ferric citrate Take 210 mg by mouth 3 (three) times daily with meals.   b complex-vitamin c-folic acid 0.8 MG Tabs tablet Take 1 tablet by mouth every Monday, Wednesday, and Friday. After dialysis treatment   calcium acetate 667 MG capsule Commonly known as: PHOSLO Take 1,334 mg by mouth 3 (three) times daily with meals.   calcium carbonate 750 MG chewable tablet Commonly known as: TUMS EX Chew 2 tablets by mouth at bedtime.   clindamycin 300 MG capsule Commonly known as: Cleocin Take 1 capsule (300 mg total) by mouth 3 (three) times daily.   gabapentin 300 MG capsule Commonly known as: NEURONTIN Take 300 mg by mouth 3 (three) times daily.   gentamicin cream 0.1 % Commonly known as: GARAMYCIN Apply 1 application topically 2 (two) times daily.   heparin 1000 UNIT/ML injection Heparin Sodium (Porcine) 1,000 Units/mL Systemic   lanthanum 1000 MG chewable tablet Commonly known as: FOSRENOL Chew 1,000 mg by mouth 3 (three) times daily with meals.   Levemir FlexTouch 100 UNIT/ML Pen Generic drug: Insulin Detemir Inject 66 Units into the skin at bedtime.   levofloxacin 500 MG tablet Commonly known as: LEVAQUIN Take 1 tablet (500 mg total) by mouth every other day for 6 days. Start taking on: May 25, 2019   lisinopril 40 MG tablet Commonly known as: ZESTRIL Take 40 mg by mouth daily.   Mapap 325 MG tablet Generic drug: acetaminophen Take 325 mg by mouth every 4 (four) hours as needed for mild pain.   metoprolol succinate 50 MG 24 hr tablet Commonly known as:  TOPROL-XL Take 50 mg by mouth daily.   rOPINIRole 1 MG tablet Commonly known as: REQUIP Take 1 mg by mouth 3 (three) times daily.          Management plans discussed with the patient and he is in agreement. Stable for discharge home  Patient should follow up with podiatry  CODE STATUS:  Code Status History    Date Active Date Inactive Code Status Order ID Comments User Context  06/24/2017 2228 06/25/2017 1441 Full Code [id number]  Fritzi Mandes, MD Inpatient   Advance Care Planning Activity      TOTAL TIME TAKING CARE OF THIS PATIENT: 38 minutes.    Note: This dictation was prepared with Dragon dictation along with smaller phrase technology. Any transcriptional errors that result from this process are unintentional.  Bettey Costa M.D on 05/24/2019 at 11:49 AM  Between 7am to 6pm - Pager - [phone] After 6pm go to [email or web address] - password [email or web address] [facility]  Office  [phone]  CC: Primary care physician; Letta Median, MD

## 2019-05-24 NOTE — Evaluation (Signed)
Physical Therapy Evaluation Patient Details Name: Donald Silva MRN: [id number] DOB: July 14, 1956 Today's Date: 05/24/2019   History of Present Illness  Pt is a 63 y.o. male presenting to hospital 05/22/19 with cough, SOB, weakness, and twitching.  Pt admitted with acute fluid overload with missed dialysis, acute hypoxic respiratory failure, hyperkalemia and twitching, questionable PNA, and post infection R foot amputation.  PMH includes 03/02/19 s/p partial 1st ray amp R, CKD, DM, htn, LE edema, MI, dyspnea, ESRD on dialysis.  Clinical Impression  Prior to hospital admission, pt was independent.  Pt lives with his wife in 1 level home with 5 steps to enter with railing.  Currently pt is modified independent with bed mobility; independent with transfers; and independent with ambulation around nursing loop (no AD use).  Initially pt's O2 reading 83-84% on room air at rest but pt reporting his "pinky finger" was the only one they could get blood stick from so trialed pulse ox on this finger and pt's O2 sats 96% on room air.  During ambulation pt's O2 sats checked multiple times with O2 89% or greater on room air (standing break required each time to make sure to get good pleth reading for O2 sats); O2 sats 96% on room air at rest end of session.  Nurse and CM notified of above O2 sats.  Pt educated on pursed lip breathing technique, pacing, and activity modification to improve breathing and decrease SOB with activity.  Pt would benefit from skilled PT to address noted impairments and functional limitations (see below for any additional details) during hospital stay.  Upon hospital discharge, anticipate no further PT needs.    Follow Up Recommendations No PT follow up    Equipment Recommendations  None recommended by PT    Recommendations for Other Services       Precautions / Restrictions Precautions Precautions: Fall Precaution Comments: L UE AV fistula Restrictions Weight Bearing Restrictions:  No Other Position/Activity Restrictions: Post op shoe R      Mobility  Bed Mobility Overal bed mobility: Modified Independent             General bed mobility comments: Semi-supine to/from sit without any noted difficulties  Transfers Overall transfer level: Independent Equipment used: None             General transfer comment: steady safe transfers noted  Ambulation/Gait Ambulation/Gait assistance: Independent Gait Distance (Feet): 200 Feet Assistive device: None Gait Pattern/deviations: Step-through pattern Gait velocity: normal   General Gait Details: mild increased B lateral sway but steady ambulation  Stairs            Wheelchair Mobility    Modified Rankin (Stroke Patients Only)       Balance Overall balance assessment: Independent(No loss of balance with sitting or standing dynamic activities)                                           Pertinent Vitals/Pain Pain Assessment: No/denies pain  HR WFL during session's activities.    Home Living Family/patient expects to be discharged to:: Private residence Living Arrangements: Spouse/significant other Available Help at Discharge: Family Type of Home: House Home Access: Stairs to enter Entrance Stairs-Rails: Right Entrance Stairs-Number of Steps: 5 Home Layout: One level Home Equipment: None      Prior Function Level of Independence: Independent         Comments:  Retired.  Reports no falls in past 6 months.     Hand Dominance        Extremity/Trunk Assessment   Upper Extremity Assessment Upper Extremity Assessment: Generalized weakness    Lower Extremity Assessment Lower Extremity Assessment: Generalized weakness    Cervical / Trunk Assessment Cervical / Trunk Assessment: Normal  Communication   Communication: No difficulties  Cognition Arousal/Alertness: Awake/alert Behavior During Therapy: WFL for tasks assessed/performed Overall Cognitive Status:  Within Functional Limits for tasks assessed                                        General Comments   Nursing cleared pt for participation in physical therapy.  Pt agreeable to PT session.    Exercises     Assessment/Plan    PT Assessment    PT Problem List Decreased strength;Decreased mobility;Decreased activity tolerance;Cardiopulmonary status limiting activity       PT Treatment Interventions Gait training;Stair training;Functional mobility training;Therapeutic activities;Therapeutic exercise;Balance training;Patient/family education    PT Goals (Current goals can be found in the Care Plan section)  Acute Rehab PT Goals Patient Stated Goal: to go home PT Goal Formulation: With patient Time For Goal Achievement: 06/07/19 Potential to Achieve Goals: Good    Frequency Min 2X/week   Barriers to discharge        Co-evaluation               AM-PAC PT "6 Clicks" Mobility  Outcome Measure Help needed turning from your back to your side while in a flat bed without using bedrails?: None Help needed moving from lying on your back to sitting on the side of a flat bed without using bedrails?: None Help needed moving to and from a bed to a chair (including a wheelchair)?: None Help needed standing up from a chair using your arms (e.g., wheelchair or bedside chair)?: None Help needed to walk in hospital room?: None Help needed climbing 3-5 steps with a railing? : A Little 6 Click Score: 23    End of Session Equipment Utilized During Treatment: Gait belt Activity Tolerance: Patient tolerated treatment well Patient left: in bed;with call bell/phone within reach Nurse Communication: Mobility status;Precautions;Other (comment)(Pt's O2 status during session) PT Visit Diagnosis: Other abnormalities of gait and mobility (R26.89);Muscle weakness (generalized) (M62.81)    Time: 6734-1937 PT Time Calculation (min) (ACUTE ONLY): 23 min   Charges:   PT  Evaluation $PT Eval Low Complexity: 1 Low PT Treatments $Therapeutic Exercise: 8-22 mins       Leitha Bleak, PT 05/24/19, 10:37 AM [phone]

## 2019-05-24 NOTE — Progress Notes (Signed)
Attempted to contact patient via phone. Prescription for oral antibiotics sent over to [facility]. Left voice mail for patient to call unit.   Fuller Mandril, RN

## 2019-05-24 NOTE — Progress Notes (Signed)
Donald Silva to be D/C'd Home per MD order.  Discussed prescriptions and follow up appointments with the patient. Prescriptions given to patient, medication list explained in detail. Pt verbalized understanding.  Allergies as of 05/24/2019      Reactions   Penicillins Anaphylaxis   Was told it could kill him, pt unsure of reaction  Has patient had a PCN reaction causing immediate rash, facial/tongue/throat swelling, SOB or lightheadedness with hypotension: No Has patient had a PCN reaction causing severe rash involving mucus membranes or skin necrosis: No Has patient had a PCN reaction that required hospitalization: No Has patient had a PCN reaction occurring within the last 10 years: No If all of the above answers are "NO", then may proceed with Cephalosporin use.   Cyclopentolate Other (See Comments)      Medication List    STOP taking these medications   doxycycline 100 MG tablet Commonly known as: VIBRA-TABS   oxyCODONE-acetaminophen 5-325 MG tablet Commonly known as: Percocet     TAKE these medications   albuterol 108 (90 Base) MCG/ACT inhaler Commonly known as: VENTOLIN HFA Inhale 1-2 puffs into the lungs every 6 (six) hours as needed for wheezing or shortness of breath.   amLODipine 10 MG tablet Commonly known as: NORVASC Take 10 mg by mouth daily.   aspirin EC 81 MG tablet Take 81 mg daily by mouth.   Auryxia 1 GM 210 MG(Fe) tablet Generic drug: ferric citrate Take 210 mg by mouth 3 (three) times daily with meals.   b complex-vitamin c-folic acid 0.8 MG Tabs tablet Take 1 tablet by mouth every Monday, Wednesday, and Friday. After dialysis treatment   calcium acetate 667 MG capsule Commonly known as: PHOSLO Take 1,334 mg by mouth 3 (three) times daily with meals.   calcium carbonate 750 MG chewable tablet Commonly known as: TUMS EX Chew 2 tablets by mouth at bedtime.   clindamycin 300 MG capsule Commonly known as: Cleocin Take 1 capsule (300 mg total) by  mouth 3 (three) times daily.   gabapentin 300 MG capsule Commonly known as: NEURONTIN Take 300 mg by mouth 3 (three) times daily.   gentamicin cream 0.1 % Commonly known as: GARAMYCIN Apply 1 application topically 2 (two) times daily.   heparin 1000 UNIT/ML injection Heparin Sodium (Porcine) 1,000 Units/mL Systemic   lanthanum 1000 MG chewable tablet Commonly known as: FOSRENOL Chew 1,000 mg by mouth 3 (three) times daily with meals.   Levemir FlexTouch 100 UNIT/ML Pen Generic drug: Insulin Detemir Inject 66 Units into the skin at bedtime.   levofloxacin 500 MG tablet Commonly known as: LEVAQUIN Take 1 tablet (500 mg total) by mouth every other day for 6 days. Start taking on: May 25, 2019   lisinopril 40 MG tablet Commonly known as: ZESTRIL Take 40 mg by mouth daily.   Mapap 325 MG tablet Generic drug: acetaminophen Take 325 mg by mouth every 4 (four) hours as needed for mild pain.   metoprolol succinate 50 MG 24 hr tablet Commonly known as: TOPROL-XL Take 50 mg by mouth daily.   rOPINIRole 1 MG tablet Commonly known as: REQUIP Take 1 mg by mouth 3 (three) times daily.       Vitals:   05/24/19 0838 05/24/19 1133  BP:  116/70  Pulse:  80  Resp:    Temp:  98 F (36.7 C)  SpO2: 98% 90%    Skin clean, dry and intact without evidence of skin break down, no evidence of skin tears  noted. IV catheter discontinued intact. Site without signs and symptoms of complications. Dressing and pressure applied. Pt denies pain at this time. No complaints noted.  An After Visit Summary was printed and given to the patient. Patient escorted via [place], and D/C home via private auto.  Fuller Mandril, RN

## 2019-05-24 NOTE — TOC Initial Note (Signed)
Transition of Care [facility]) - Initial/Assessment Note    Patient Details  Name: Donald Silva MRN: [id number] Date of Birth: May 30, 1956  Transition of Care [facility]) CM/SW Contact:    Beverly Sessions, RN Phone Number: 05/24/2019, 10:45 AM  Clinical Narrative:                  Patient admitted from home with fluid overload.  Patient did not feel well and missed his scheduled HD session  Patient lives at home with wife Independent at baseline  PCP - Rebeca Alert at [facility].  Denies issues with transportation and drives himself at baseline  Patient obtains his medications from [facility].  Denies issues obtaining medications.   PT has assessed patient and reported that No follow up PT recommended  Patient was able to maintain saturations of 89% or higher with exertion   Elvera Bicker dialysis liaison notified of discharge  Expected Discharge Plan: Home/Self Care Barriers to Discharge: Barriers Resolved   Patient Goals and CMS Choice        Expected Discharge Plan and Services Expected Discharge Plan: Home/Self Care       Living arrangements for the past 2 months: Single Family Home Expected Discharge Date: 05/24/19                                    Prior Living Arrangements/Services Living arrangements for the past 2 months: Single Family Home Lives with:: Spouse Patient language and need for interpreter reviewed:: Yes Do you feel safe going back to the place where you live?: Yes      Need for Family Participation in Patient Care: Yes (Comment) Care giver support system in place?: Yes (comment)   Criminal Activity/Legal Involvement Pertinent to Current Situation/Hospitalization: No - Comment as needed  Activities of Daily Living Home Assistive Devices/Equipment: None ADL Screening (condition at time of admission) Patient's cognitive ability adequate to safely complete daily activities?: Yes Is the patient deaf or have difficulty hearing?:  No Does the patient have difficulty seeing, even when wearing glasses/contacts?: No Does the patient have difficulty concentrating, remembering, or making decisions?: No Patient able to express need for assistance with ADLs?: Yes Does the patient have difficulty dressing or bathing?: No Independently performs ADLs?: Yes (appropriate for developmental age) Does the patient have difficulty walking or climbing stairs?: Yes Weakness of Legs: None Weakness of Arms/Hands: None  Permission Sought/Granted                  Emotional Assessment Appearance:: Appears stated age Attitude/Demeanor/Rapport: Gracious Affect (typically observed): Accepting Orientation: : Oriented to Self, Oriented to Place, Oriented to  Time, Oriented to Situation   Psych Involvement: No (comment)  Admission diagnosis:  Shortness of breath [R06.02] Hyperkalemia [E87.5] Hypoxia [R09.02] Patient Active Problem List   Diagnosis Date Noted  . Acute CHF (congestive heart failure) ([place]) 05/22/2019  . Postop check 03/02/2019  . Diabetes 1.5, managed as type 2 ([place]) 12/15/2018  . Complication of vascular access for dialysis 04/20/2018  . Chronic kidney disease, stage V ([place]) 08/11/2017  . Essential hypertension 08/11/2017  . 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[place]) 08/11/2017  . Hyperkalemia 06/24/2017  . Multiple-type hyperlipidemia 03/25/2012  . Vitamin D deficiency 03/25/2012  . ED (erectile dysfunction) of organic origin 09/14/2011   PCP:  Letta Median, MD Pharmacy:   [facility] [place] [place] [address] Phone: [phone] Fax: [phone]  [id number] - [address] [place] [address] [place] [address] Phone: [phone] Fax: [place], [place] [address] Phone:  [phone] Fax: [phone]     Social Determinants of Health (SDOH) Interventions    Readmission Risk Interventions Readmission Risk Prevention Plan 05/24/2019  PCP or Specialist Appt within 3-5 Days Complete  HRI or [place] (No Data)  Palliative Care Screening Not Applicable  Medication Review (RN Care Manager) Complete  Some recent data might be hidden

## 2019-05-26 ENCOUNTER — Other Ambulatory Visit: Payer: Self-pay

## 2019-05-26 ENCOUNTER — Encounter: Payer: Self-pay | Admitting: Podiatry

## 2019-05-26 ENCOUNTER — Ambulatory Visit (INDEPENDENT_AMBULATORY_CARE_PROVIDER_SITE_OTHER): Payer: Medicare Other | Admitting: Podiatry

## 2019-05-26 DIAGNOSIS — E0843 Diabetes mellitus due to underlying condition with diabetic autonomic (poly)neuropathy: Secondary | ICD-10-CM | POA: Diagnosis not present

## 2019-05-26 DIAGNOSIS — L97512 Non-pressure chronic ulcer of other part of right foot with fat layer exposed: Secondary | ICD-10-CM | POA: Diagnosis not present

## 2019-05-27 LAB — CULTURE, BLOOD (ROUTINE X 2)
Culture: NO GROWTH
Culture: NO GROWTH
Special Requests: ADEQUATE
Special Requests: ADEQUATE

## 2019-05-30 NOTE — Progress Notes (Signed)
   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 DOS: 03/02/2019. He states he is doing well. He reports being in the hospital and was having the wound dressed with something different while admitted. He has been using the post op shoe as directed. Patient is here for further evaluation and treatment.   Past Medical History:  Diagnosis Date  . CKD (chronic kidney disease) stage 5, GFR less than 15 ml/min (HCC)   . Diabetes mellitus without complication ([place])   . Dyspnea   . GERD (gastroesophageal reflux disease)   . Hypertension   . Lower extremity edema   . Myocardial infarction [facility])       Objective/Physical Exam Neurovascular status intact. Ulceration measuring 2.0  1.5 x 1.0 cm noted to central incision with moderate drainage. No active bleeding noted. Moderate edema noted to the surgical extremity.  Assessment: 1. 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 DOS: 03/02/2019 2. 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 Patient was evaluated.   2. Medically necessary excisional debridement including muscle and deep fascial tissue was performed using a tissue nipper and a chisel blade. Excisional debridement of all the necrotic nonviable tissue down to healthy bleeding viable tissue was performed with post-debridement measurements same as pre-. 3. The wound was cleansed and dry sterile dressing applied. 4. Continue using Aquacel Ag every other day.  5. Continue using post op shoe.  6. Return to clinic in 3 weeks.    Edrick Kins, DPM [facility]  Dr. Edrick Kins, [address]                                        [place], [place] [address]                Office [phone]  Fax [phone]

## 2019-06-13 ENCOUNTER — Emergency Department: Payer: BC Managed Care – PPO

## 2019-06-13 ENCOUNTER — Inpatient Hospital Stay
Admission: EM | Admit: 2019-06-13 | Discharge: 2019-06-15 | DRG: 291 | Disposition: A | Discharge status: Home / Self Care | Payer: BC Managed Care – PPO | Attending: Family Medicine | Admitting: Family Medicine

## 2019-06-13 ENCOUNTER — Other Ambulatory Visit: Payer: Self-pay

## 2019-06-13 DIAGNOSIS — Z20828 Contact with and (suspected) exposure to other viral communicable diseases: Secondary | ICD-10-CM | POA: Diagnosis present

## 2019-06-13 DIAGNOSIS — I248 Other forms of acute ischemic heart disease: Secondary | ICD-10-CM | POA: Diagnosis present

## 2019-06-13 DIAGNOSIS — Z880 Allergy status to penicillin: Secondary | ICD-10-CM

## 2019-06-13 DIAGNOSIS — I5033 Acute on chronic diastolic (congestive) heart failure: Secondary | ICD-10-CM | POA: Diagnosis present

## 2019-06-13 DIAGNOSIS — N2581 Secondary hyperparathyroidism of renal origin: Secondary | ICD-10-CM | POA: Diagnosis present

## 2019-06-13 DIAGNOSIS — I2510 Atherosclerotic heart disease of native coronary artery without angina pectoris: Secondary | ICD-10-CM | POA: Diagnosis present

## 2019-06-13 DIAGNOSIS — I452 Bifascicular block: Secondary | ICD-10-CM | POA: Diagnosis present

## 2019-06-13 DIAGNOSIS — E1122 Type 2 diabetes mellitus with diabetic chronic kidney disease: Secondary | ICD-10-CM | POA: Diagnosis present

## 2019-06-13 DIAGNOSIS — J810 Acute pulmonary edema: Secondary | ICD-10-CM

## 2019-06-13 DIAGNOSIS — J9602 Acute respiratory failure with hypercapnia: Secondary | ICD-10-CM | POA: Diagnosis present

## 2019-06-13 DIAGNOSIS — E875 Hyperkalemia: Secondary | ICD-10-CM | POA: Diagnosis present

## 2019-06-13 DIAGNOSIS — I160 Hypertensive urgency: Secondary | ICD-10-CM | POA: Diagnosis present

## 2019-06-13 DIAGNOSIS — Z8249 Family history of ischemic heart disease and other diseases of the circulatory system: Secondary | ICD-10-CM | POA: Diagnosis not present

## 2019-06-13 DIAGNOSIS — Z9115 Patient's noncompliance with renal dialysis: Secondary | ICD-10-CM | POA: Diagnosis not present

## 2019-06-13 DIAGNOSIS — J9600 Acute respiratory failure, unspecified whether with hypoxia or hypercapnia: Secondary | ICD-10-CM | POA: Diagnosis present

## 2019-06-13 DIAGNOSIS — Z7982 Long term (current) use of aspirin: Secondary | ICD-10-CM | POA: Diagnosis not present

## 2019-06-13 DIAGNOSIS — N179 Acute kidney failure, unspecified: Secondary | ICD-10-CM | POA: Diagnosis not present

## 2019-06-13 DIAGNOSIS — I132 Hypertensive heart and chronic kidney disease with heart failure and with stage 5 chronic kidney disease, or end stage renal disease: Secondary | ICD-10-CM | POA: Diagnosis present

## 2019-06-13 DIAGNOSIS — J9601 Acute respiratory failure with hypoxia: Secondary | ICD-10-CM | POA: Diagnosis present

## 2019-06-13 DIAGNOSIS — Z794 Long term (current) use of insulin: Secondary | ICD-10-CM

## 2019-06-13 DIAGNOSIS — K219 Gastro-esophageal reflux disease without esophagitis: Secondary | ICD-10-CM | POA: Diagnosis present

## 2019-06-13 DIAGNOSIS — Z833 Family history of diabetes mellitus: Secondary | ICD-10-CM | POA: Diagnosis not present

## 2019-06-13 DIAGNOSIS — R918 Other nonspecific abnormal finding of lung field: Secondary | ICD-10-CM

## 2019-06-13 DIAGNOSIS — I10 Essential (primary) hypertension: Secondary | ICD-10-CM | POA: Diagnosis not present

## 2019-06-13 DIAGNOSIS — N186 End stage renal disease: Secondary | ICD-10-CM | POA: Diagnosis present

## 2019-06-13 DIAGNOSIS — R079 Chest pain, unspecified: Secondary | ICD-10-CM

## 2019-06-13 DIAGNOSIS — Z888 Allergy status to other drugs, medicaments and biological substances status: Secondary | ICD-10-CM

## 2019-06-13 DIAGNOSIS — I252 Old myocardial infarction: Secondary | ICD-10-CM

## 2019-06-13 DIAGNOSIS — I4510 Unspecified right bundle-branch block: Secondary | ICD-10-CM | POA: Diagnosis present

## 2019-06-13 DIAGNOSIS — Z992 Dependence on renal dialysis: Secondary | ICD-10-CM

## 2019-06-13 DIAGNOSIS — D631 Anemia in chronic kidney disease: Secondary | ICD-10-CM | POA: Diagnosis present

## 2019-06-13 LAB — CBC WITH DIFFERENTIAL/PLATELET
Abs Immature Granulocytes: 0.03 10*3/uL (ref 0.00–0.07)
Basophils Absolute: 0.1 10*3/uL (ref 0.0–0.1)
Basophils Relative: 1 %
Eosinophils Absolute: 0.1 10*3/uL (ref 0.0–0.5)
Eosinophils Relative: 1 %
HCT: 33.7 % — ABNORMAL LOW (ref 39.0–52.0)
Hemoglobin: 10.6 g/dL — ABNORMAL LOW (ref 13.0–17.0)
Immature Granulocytes: 0 %
Lymphocytes Relative: 7 %
Lymphs Abs: 0.7 10*3/uL (ref 0.7–4.0)
MCH: 31.4 pg (ref 26.0–34.0)
MCHC: 31.5 g/dL (ref 30.0–36.0)
MCV: 99.7 fL (ref 80.0–100.0)
Monocytes Absolute: 0.7 10*3/uL (ref 0.1–1.0)
Monocytes Relative: 7 %
Neutro Abs: 8.2 10*3/uL — ABNORMAL HIGH (ref 1.7–7.7)
Neutrophils Relative %: 84 %
Platelets: 209 10*3/uL (ref 150–400)
RBC: 3.38 MIL/uL — ABNORMAL LOW (ref 4.22–5.81)
RDW: 14.6 % (ref 11.5–15.5)
WBC: 9.8 10*3/uL (ref 4.0–10.5)
nRBC: 0 % (ref 0.0–0.2)

## 2019-06-13 LAB — GLUCOSE, CAPILLARY: Glucose-Capillary: 95 mg/dL (ref 70–99)

## 2019-06-13 LAB — COMPREHENSIVE METABOLIC PANEL
ALT: 85 U/L — ABNORMAL HIGH (ref 0–44)
AST: 67 U/L — ABNORMAL HIGH (ref 15–41)
Albumin: 4.3 g/dL (ref 3.5–5.0)
Alkaline Phosphatase: 81 U/L (ref 38–126)
Anion gap: 18 — ABNORMAL HIGH (ref 5–15)
BUN: 150 mg/dL — ABNORMAL HIGH (ref 8–23)
CO2: 23 mmol/L (ref 22–32)
Calcium: 8.3 mg/dL — ABNORMAL LOW (ref 8.9–10.3)
Chloride: 101 mmol/L (ref 98–111)
Creatinine, Ser: 20.99 mg/dL — ABNORMAL HIGH (ref 0.61–1.24)
GFR calc Af Amer: 2 mL/min — ABNORMAL LOW (ref >60)
GFR calc non Af Amer: 2 mL/min — ABNORMAL LOW (ref >60)
Glucose, Bld: 168 mg/dL — ABNORMAL HIGH (ref 70–99)
Potassium: 7 mmol/L (ref 3.5–5.1)
Sodium: 142 mmol/L (ref 135–145)
Total Bilirubin: 0.7 mg/dL (ref 0.3–1.2)
Total Protein: 8.8 g/dL — ABNORMAL HIGH (ref 6.5–8.1)

## 2019-06-13 LAB — TSH: TSH: 1.505 u[IU]/mL (ref 0.350–4.500)

## 2019-06-13 LAB — PROCALCITONIN: Procalcitonin: 1.06 ng/mL

## 2019-06-13 LAB — HIV ANTIBODY (ROUTINE TESTING W REFLEX): HIV Screen 4th Generation wRfx: NONREACTIVE

## 2019-06-13 LAB — SARS CORONAVIRUS 2 BY RT PCR (HOSPITAL ORDER, PERFORMED IN ~~LOC~~ HOSPITAL LAB): SARS Coronavirus 2: NEGATIVE

## 2019-06-13 LAB — POTASSIUM: Potassium: 3.7 mmol/L (ref 3.5–5.1)

## 2019-06-13 LAB — TROPONIN I (HIGH SENSITIVITY): Troponin I (High Sensitivity): 33 ng/L — ABNORMAL HIGH (ref <18)

## 2019-06-13 LAB — MRSA PCR SCREENING: MRSA by PCR: NEGATIVE

## 2019-06-13 MED ORDER — NITROGLYCERIN IN D5W 200-5 MCG/ML-% IV SOLN
0.0000 ug/min | INTRAVENOUS | Status: DC
  Administered 2019-06-13: 60 ug/min via INTRAVENOUS
  Administered 2019-06-13: 50 ug/min via INTRAVENOUS
  Administered 2019-06-13: 40 ug/min via INTRAVENOUS
  Filled 2019-06-13: qty 250

## 2019-06-13 MED ORDER — RENA-VITE PO TABS
1.0000 | ORAL_TABLET | ORAL | Status: DC
  Administered 2019-06-14: 1 via ORAL
  Filled 2019-06-13: qty 1

## 2019-06-13 MED ORDER — GUAIFENESIN ER 600 MG PO TB12
600.0000 mg | ORAL_TABLET | Freq: Two times a day (BID) | ORAL | Status: DC
  Administered 2019-06-13 – 2019-06-14 (×4): 600 mg via ORAL
  Filled 2019-06-13 (×6): qty 1

## 2019-06-13 MED ORDER — ROPINIROLE HCL 1 MG PO TABS
1.0000 mg | ORAL_TABLET | Freq: Three times a day (TID) | ORAL | Status: DC
  Administered 2019-06-13 – 2019-06-14 (×6): 1 mg via ORAL
  Filled 2019-06-13 (×7): qty 1

## 2019-06-13 MED ORDER — SODIUM CHLORIDE 0.9 % IV SOLN: 100.0000 mL | INTRAVENOUS | Status: DC | PRN

## 2019-06-13 MED ORDER — ALBUTEROL SULFATE (2.5 MG/3ML) 0.083% IN NEBU: 2.5000 mg | INHALATION_SOLUTION | Freq: Once | RESPIRATORY_TRACT | Status: DC

## 2019-06-13 MED ORDER — CALCIUM ACETATE (PHOS BINDER) 667 MG PO CAPS
1334.0000 mg | ORAL_CAPSULE | Freq: Three times a day (TID) | ORAL | Status: DC
  Administered 2019-06-13 – 2019-06-14 (×5): 1334 mg via ORAL
  Filled 2019-06-13 (×9): qty 2

## 2019-06-13 MED ORDER — SODIUM ZIRCONIUM CYCLOSILICATE 10 G PO PACK
10.0000 g | PACK | Freq: Once | ORAL | Status: AC
  Administered 2019-06-13: 10 g via ORAL
  Filled 2019-06-13: qty 1

## 2019-06-13 MED ORDER — NITROGLYCERIN IN D5W 200-5 MCG/ML-% IV SOLN
0.0000 ug/min | INTRAVENOUS | Status: DC
  Administered 2019-06-13: 50 ug/min via INTRAVENOUS
  Filled 2019-06-13: qty 250

## 2019-06-13 MED ORDER — INSULIN ASPART 100 UNIT/ML ~~LOC~~ SOLN
10.0000 [IU] | Freq: Once | SUBCUTANEOUS | Status: AC
  Administered 2019-06-13: 10 [IU] via INTRAVENOUS
  Filled 2019-06-13: qty 1

## 2019-06-13 MED ORDER — LANTHANUM CARBONATE 500 MG PO CHEW
3000.0000 mg | CHEWABLE_TABLET | Freq: Three times a day (TID) | ORAL | Status: DC
  Administered 2019-06-13 – 2019-06-14 (×5): 3000 mg via ORAL
  Filled 2019-06-13 (×9): qty 6

## 2019-06-13 MED ORDER — HEPARIN SODIUM (PORCINE) 5000 UNIT/ML IJ SOLN
5000.0000 [IU] | Freq: Three times a day (TID) | INTRAMUSCULAR | Status: DC
  Administered 2019-06-13 – 2019-06-15 (×6): 5000 [IU] via SUBCUTANEOUS
  Filled 2019-06-13 (×6): qty 1

## 2019-06-13 MED ORDER — AMLODIPINE BESYLATE 10 MG PO TABS
10.0000 mg | ORAL_TABLET | Freq: Every day | ORAL | Status: DC
  Administered 2019-06-13 – 2019-06-14 (×2): 10 mg via ORAL
  Filled 2019-06-13 (×2): qty 1

## 2019-06-13 MED ORDER — SODIUM BICARBONATE 8.4 % IV SOLN
50.0000 meq | Freq: Once | INTRAVENOUS | Status: AC
  Administered 2019-06-13: 50 meq via INTRAVENOUS
  Filled 2019-06-13: qty 50

## 2019-06-13 MED ORDER — CALCIUM CARBONATE-VITAMIN D 500-200 MG-UNIT PO TABS
2.0000 | ORAL_TABLET | Freq: Every day | ORAL | Status: DC
  Administered 2019-06-13 – 2019-06-14 (×2): 2 via ORAL
  Filled 2019-06-13 (×2): qty 2

## 2019-06-13 MED ORDER — LIDOCAINE-PRILOCAINE 2.5-2.5 % EX CREA
1.0000 "application " | TOPICAL_CREAM | CUTANEOUS | Status: DC | PRN
  Filled 2019-06-13: qty 5

## 2019-06-13 MED ORDER — ASPIRIN EC 81 MG PO TBEC
81.0000 mg | DELAYED_RELEASE_TABLET | Freq: Every day | ORAL | Status: DC
  Administered 2019-06-13 – 2019-06-14 (×2): 81 mg via ORAL
  Filled 2019-06-13 (×2): qty 1

## 2019-06-13 MED ORDER — GABAPENTIN 300 MG PO CAPS: 300.0000 mg | ORAL_CAPSULE | Freq: Three times a day (TID) | ORAL | Status: DC

## 2019-06-13 MED ORDER — FUROSEMIDE 10 MG/ML IJ SOLN
60.0000 mg | Freq: Once | INTRAMUSCULAR | Status: AC
  Administered 2019-06-13: 60 mg via INTRAVENOUS
  Filled 2019-06-13: qty 8

## 2019-06-13 MED ORDER — LEVOFLOXACIN IN D5W 750 MG/150ML IV SOLN: 750.0000 mg | INTRAVENOUS | Status: DC

## 2019-06-13 MED ORDER — ALTEPLASE 2 MG IJ SOLR
2.0000 mg | Freq: Once | INTRAMUSCULAR | Status: DC | PRN
  Filled 2019-06-13: qty 2

## 2019-06-13 MED ORDER — LEVOFLOXACIN IN D5W 750 MG/150ML IV SOLN
750.0000 mg | Freq: Once | INTRAVENOUS | Status: DC
  Filled 2019-06-13: qty 150

## 2019-06-13 MED ORDER — FUROSEMIDE 10 MG/ML IJ SOLN
60.0000 mg | Freq: Two times a day (BID) | INTRAMUSCULAR | Status: DC
  Administered 2019-06-13 – 2019-06-14 (×3): 60 mg via INTRAVENOUS
  Filled 2019-06-13 (×3): qty 6

## 2019-06-13 MED ORDER — CALCIUM GLUCONATE-NACL 1-0.675 GM/50ML-% IV SOLN
1.0000 g | Freq: Once | INTRAVENOUS | Status: AC
  Administered 2019-06-13: 1000 mg via INTRAVENOUS
  Filled 2019-06-13: qty 50

## 2019-06-13 MED ORDER — ALBUTEROL SULFATE (2.5 MG/3ML) 0.083% IN NEBU: 2.5000 mg | INHALATION_SOLUTION | Freq: Four times a day (QID) | RESPIRATORY_TRACT | Status: DC | PRN

## 2019-06-13 MED ORDER — ACETAMINOPHEN 325 MG PO TABS: 650.0000 mg | ORAL_TABLET | ORAL | Status: DC | PRN

## 2019-06-13 MED ORDER — METOPROLOL SUCCINATE ER 50 MG PO TB24
50.0000 mg | ORAL_TABLET | Freq: Every day | ORAL | Status: DC
  Administered 2019-06-13 – 2019-06-14 (×2): 50 mg via ORAL
  Filled 2019-06-13 (×2): qty 1

## 2019-06-13 MED ORDER — ZOLPIDEM TARTRATE 5 MG PO TABS
5.0000 mg | ORAL_TABLET | Freq: Every evening | ORAL | Status: DC | PRN
  Administered 2019-06-13: 5 mg via ORAL
  Filled 2019-06-13 (×2): qty 1

## 2019-06-13 MED ORDER — SODIUM CHLORIDE 0.9 % IV SOLN: 250.0000 mL | INTRAVENOUS | Status: DC | PRN

## 2019-06-13 MED ORDER — HEPARIN SODIUM (PORCINE) 1000 UNIT/ML IJ SOLN: 8000.0000 [IU] | Freq: Once | INTRAMUSCULAR | Status: DC

## 2019-06-13 MED ORDER — SODIUM CHLORIDE 0.9% FLUSH: 3.0000 mL | INTRAVENOUS | Status: DC | PRN

## 2019-06-13 MED ORDER — INSULIN DETEMIR 100 UNIT/ML ~~LOC~~ SOLN
35.0000 [IU] | Freq: Every day | SUBCUTANEOUS | Status: DC
  Administered 2019-06-13: 35 [IU] via SUBCUTANEOUS
  Filled 2019-06-13 (×2): qty 0.35

## 2019-06-13 MED ORDER — SODIUM CHLORIDE 0.9% FLUSH
3.0000 mL | Freq: Two times a day (BID) | INTRAVENOUS | Status: DC
  Administered 2019-06-13 – 2019-06-14 (×4): 3 mL via INTRAVENOUS

## 2019-06-13 MED ORDER — ALBUTEROL SULFATE (2.5 MG/3ML) 0.083% IN NEBU
10.0000 mg | INHALATION_SOLUTION | Freq: Once | RESPIRATORY_TRACT | Status: AC
  Administered 2019-06-13: 10 mg via RESPIRATORY_TRACT
  Filled 2019-06-13: qty 12

## 2019-06-13 MED ORDER — HEPARIN SODIUM (PORCINE) 1000 UNIT/ML DIALYSIS
1000.0000 [IU] | INTRAMUSCULAR | Status: DC | PRN
  Filled 2019-06-13: qty 1

## 2019-06-13 MED ORDER — DEXTROSE 50 % IV SOLN
1.0000 | Freq: Once | INTRAVENOUS | Status: AC
  Administered 2019-06-13: 50 mL via INTRAVENOUS
  Filled 2019-06-13: qty 50

## 2019-06-13 MED ORDER — FERRIC CITRATE 1 GM 210 MG(FE) PO TABS
210.0000 mg | ORAL_TABLET | Freq: Three times a day (TID) | ORAL | Status: DC
  Administered 2019-06-13 – 2019-06-14 (×5): 210 mg via ORAL
  Filled 2019-06-13 (×9): qty 1

## 2019-06-13 MED ORDER — PENTAFLUOROPROP-TETRAFLUOROETH EX AERO
1.0000 "application " | INHALATION_SPRAY | CUTANEOUS | Status: DC | PRN
  Filled 2019-06-13: qty 30

## 2019-06-13 MED ORDER — ONDANSETRON HCL 4 MG/2ML IJ SOLN: 4.0000 mg | Freq: Four times a day (QID) | INTRAMUSCULAR | Status: DC | PRN

## 2019-06-13 MED ORDER — FUROSEMIDE 10 MG/ML IJ SOLN: 60.0000 mg | Freq: Two times a day (BID) | INTRAMUSCULAR | Status: DC

## 2019-06-13 MED ORDER — CHLORHEXIDINE GLUCONATE CLOTH 2 % EX PADS: 6.0000 | MEDICATED_PAD | Freq: Every day | CUTANEOUS | Status: DC

## 2019-06-13 MED ORDER — LEVOFLOXACIN IN D5W 500 MG/100ML IV SOLN: 500.0000 mg | INTRAVENOUS | Status: DC

## 2019-06-13 MED ORDER — IPRATROPIUM-ALBUTEROL 0.5-2.5 (3) MG/3ML IN SOLN: 3.0000 mL | Freq: Four times a day (QID) | RESPIRATORY_TRACT | Status: DC

## 2019-06-13 MED ORDER — LIDOCAINE HCL (PF) 1 % IJ SOLN
5.0000 mL | INTRAMUSCULAR | Status: DC | PRN
  Filled 2019-06-13: qty 5

## 2019-06-13 MED ORDER — IPRATROPIUM-ALBUTEROL 0.5-2.5 (3) MG/3ML IN SOLN: 3.0000 mL | RESPIRATORY_TRACT | Status: DC | PRN

## 2019-06-13 NOTE — Progress Notes (Signed)
Called by ED, pt on Bipap and with severe hyperkalemia.   Pt to receive shifting measures and lokelma.  Dialysis RN notified about pt arrival and need for urgent HD.  Pt also on nitro gtt.  Will monitor progress.

## 2019-06-13 NOTE — ED Notes (Signed)
Dialysis RN @ [facility].

## 2019-06-13 NOTE — Consult Note (Signed)
Cardiology Consultation:   Patient ID: [name] MRN: [id number]; DOB: September 21, 1955  Admit date: 06/13/2019 Date of Consult: 06/13/2019  Primary Care Provider: Letta Median, MD Primary Cardiologist:[facility], Dr. Saunders Revel rounding Primary Electrophysiologist:  None    Patient Profile:   Donald Silva is a 63 y.o. male with a hx of end-stage renal disease on hemodialysis, hypertension, DM2, and who is being seen today for the evaluation of volume overload at the request of Dr. Sidney Ace.  History of Present Illness:   [name] is a 63 year old male with PMH as above. Per his chart history, he also has a history of CAD; however, at this time, I am unable to find a cardiology progress note or procedure regarding this diagnosis.  He reportedly goes to dialysis on Tuesday, Thursday, and Saturday. He has a known history of elevated BP and states he checks his blood pressure at home with systolic blood pressure frequently in the 190s.  He reports taking all of his antihypertensive medications at home.  He also reports that he frequently feels uneasy and can tell when his blood pressure is elevated as he does not feel right.  He also has tremors whenever his potassium is too high.  He was last admitted to [facility] 05/22/19 and after missing dialysis on a Saturday then presenting with acute onset SOB, "twitching," and weakness the following Monday 10/12. He was found to have acute hypoxic respiratory failure and hyperkalemia in the setting of volume overload, as well as LLL pna. It was also noted that he had postoperative infection of his right foot amputation site with podiatry consulted and abx started. He received abx per ID and underwent dialysis and was discharged once euvolemic and off of oxygen. Echocardiogram, performed that admission for SOB and minimally elevated Tn, showed EF 60 to 65%, moderately increased LVH, G1DD, mildly increased right ventricular thickness.   On Saturday 06/10/2019, he  overslept and missed dialysis, making it 5 days since his last dialysis.  Despite missing dialysis, he was reportedly feeling in his usual state of health until 07/10/2019, when he reportedly started feeling short of breath.  No associated chest pain, rapid heart rate, or palpitations.  No recent cough, body aches, fever, chills, emesis, diarrhea. He did report tremor and uneasiness. In the ED, BP 165/102, HR 83, respiration rate 28, temperature 97.4, 82% on room air.  Labs significant for potassium 7.0, sodium 142, creatinine 20.99, BUN 150, AST 67, ALT 85, troponin 33, procalcitonin 1.06, hemoglobin 10.6, RBC 3.38, WBC 9.8.  Chest x-ray showed low lung volumes with patchy bilateral airspace opacities concerning for pneumonia.  COVID-19 test negative. 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 HS Tn 33. TSH 1.505.  In the ED, he received IV Lasix 60 mg, D50 insulin, nebulized albuterol, and serum bicarbonate and Lokelma in addition to IV nitro drip.  Nephrology has been consulted for emergent hemodialysis, given the patient's severe hyperkalemia in the setting of missed dialysis on Saturday, as well as acute respiratory failure.  He was admitted to the ICU for further evaluation and management and cardiology consulted.  Heart Pathway Score:     Past Medical History:  Diagnosis Date  . CKD (chronic kidney disease) stage 5, GFR less than 15 ml/min (HCC)   . Diabetes mellitus without complication ([place])   . Dyspnea   . GERD (gastroesophageal reflux disease)   . Hypertension   . Lower extremity edema   . Myocardial infarction (  [facility])     Past Surgical History:  Procedure Laterality Date  . A/V FISTULAGRAM N/A 05/03/2018   Procedure: A/V FISTULAGRAM;  Surgeon: Katha Cabal, MD;  Location: [place] [facility];  Service: Cardiovascular;  Laterality: N/A;  . AV FISTULA PLACEMENT Left 08/27/2017   Procedure: ARTERIOVENOUS (AV) FISTULA CREATION ( BRACHIAL CEPHALIC );  Surgeon:  Katha Cabal, MD;  Location: [facility] ORS;  Service: Vascular;  Laterality: Left;  . CHOLECYSTECTOMY    . TOE SURGERY Right      Home Medications:  Prior to Admission medications   Medication Sig Start Date End Date Taking? Authorizing Provider  albuterol (PROVENTIL HFA;VENTOLIN HFA) 108 (90 Base) MCG/ACT inhaler Inhale 1-2 puffs into the lungs every 6 (six) hours as needed for wheezing or shortness of breath.  08/17/18 08/17/19 Yes [provider]  amLODipine (NORVASC) 10 MG tablet Take 10 mg by mouth daily.  09/02/18  Yes [provider]  aspirin EC 81 MG tablet Take 81 mg daily by mouth.   Yes [provider]  AURYXIA 1 GM 210 MG(Fe) tablet Take 210 mg by mouth 3 (three) times daily with meals.  05/20/18  Yes [provider]  b complex-vitamin c-folic acid (NEPHRO-VITE) 0.8 MG TABS tablet Take 1 tablet by mouth every Monday, Wednesday, and Friday. After dialysis treatment 03/11/18  Yes [provider]  calcium acetate (PHOSLO) 667 MG capsule Take 1,334 mg by mouth 3 (three) times daily with meals.  01/28/18  Yes [provider]  calcium carbonate (TUMS EX) 750 MG chewable tablet Chew 2 tablets by mouth at bedtime.   Yes [provider]  gabapentin (NEURONTIN) 300 MG capsule Take 300 mg by mouth 3 (three) times daily.  07/22/18  Yes [provider]  Insulin Detemir (LEVEMIR FLEXTOUCH) 100 UNIT/ML Pen Inject 66 Units into the skin at bedtime.  06/01/16  Yes [provider]  lanthanum (FOSRENOL) 1000 MG chewable tablet Chew 3,000 mg by mouth 3 (three) times daily with meals.  05/30/18  Yes [provider]  lisinopril (ZESTRIL) 40 MG tablet Take 40 mg by mouth daily.  02/05/18  Yes [provider]  metoprolol succinate (TOPROL-XL) 50 MG 24 hr tablet Take 50 mg by mouth daily.  09/12/18  Yes [provider]  rOPINIRole (REQUIP) 1 MG tablet Take 1 mg by mouth 3 (three) times daily.  03/23/19  Yes  [provider]  acetaminophen (MAPAP) 325 MG tablet Take 325 mg by mouth every 4 (four) hours as needed for mild pain.  01/31/19 01/30/20  [provider]  clindamycin (CLEOCIN) 300 MG capsule Take 1 capsule (300 mg total) by mouth 3 (three) times daily. Patient not taking: Reported on 06/13/2019 05/05/19   Edrick Kins, DPM  gentamicin cream (GARAMYCIN) 0.1 % Apply 1 application topically 2 (two) times daily. Patient not taking: Reported on 05/22/2019 10/18/18   Edrick Kins, DPM  heparin 1000 UNIT/ML injection Inject 8,000 Units into the vein every dialysis.  01/17/19 01/16/20  [provider]    Inpatient Medications: Scheduled Meds: . albuterol  2.5 mg Nebulization Once  . amLODipine  10 mg Oral Daily  . aspirin EC  81 mg Oral Daily  . calcium acetate  1,334 mg Oral TID WC  . calcium-vitamin D  2 tablet Oral QHS  . Chlorhexidine Gluconate Cloth  6 each Topical Q0600  . ferric citrate  210 mg Oral TID WC  . furosemide  60 mg Intravenous Q12H  .  gabapentin  300 mg Oral TID  . guaiFENesin  600 mg Oral BID  . heparin  5,000 Units Subcutaneous Q8H  . insulin detemir  35 Units Subcutaneous QHS  . ipratropium-albuterol  3 mL Nebulization QID  . lanthanum  3,000 mg Oral TID WC  . metoprolol succinate  50 mg Oral Daily  . [START ON 06/14/2019] multivitamin  1 tablet Oral Once per day on Mon Wed Fri  . rOPINIRole  1 mg Oral TID  . sodium chloride flush  3 mL Intravenous Q12H   Continuous Infusions: . sodium chloride    . levofloxacin (LEVAQUIN) IV     Followed by  . [START ON 06/15/2019] levofloxacin (LEVAQUIN) IV    . nitroGLYCERIN 40 mcg/min (06/13/19 0628)   PRN Meds: sodium chloride, acetaminophen, albuterol, ondansetron (ZOFRAN) IV, sodium chloride flush, zolpidem  Allergies:    Allergies  Allergen Reactions  . Penicillins Anaphylaxis    Was told it could kill him, pt unsure of reaction  Has patient had a PCN reaction causing immediate rash,  facial/tongue/throat swelling, SOB or lightheadedness with hypotension: No Has patient had a PCN reaction causing severe rash involving mucus membranes or skin necrosis: No Has patient had a PCN reaction that required hospitalization: No Has patient had a PCN reaction occurring within the last 10 years: No If all of the above answers are "NO", then may proceed with Cephalosporin use.   . Cyclopentolate Other (See Comments)    Social History:   Social History   Socioeconomic History  . Marital status: Single    Spouse name: Not on file  . Number of children: Not on file  . Years of education: Not on file  . Highest education level: Not on file  Occupational History  . Not on file  Social Needs  . Financial resource strain: Not on file  . Food insecurity    Worry: Not on file    Inability: Not on file  . Transportation needs    Medical: Not on file    Non-medical: Not on file  Tobacco Use  . Smoking status: Never Smoker  . Smokeless tobacco: Never Used  Substance and Sexual Activity  . Alcohol use: No  . Drug use: No  . Sexual activity: Not on file  Lifestyle  . Physical activity    Days per week: Not on file    Minutes per session: Not on file  . Stress: Not on file  Relationships  . Social Herbalist on phone: Not on file    Gets together: Not on file    Attends religious service: Not on file    Active member of club or organization: Not on file    Attends meetings of clubs or organizations: Not on file    Relationship status: Not on file  . Intimate partner violence    Fear of current or ex partner: Not on file    Emotionally abused: Not on file    Physically abused: Not on file    Forced sexual activity: Not on file  Other Topics Concern  . Not on file  Social History Narrative  . Not on file    Family History:    Family History  Problem Relation Age of Onset  . Hypertension Sister   . Diabetes Sister   . Diabetes Father      ROS:   Please see the history of present illness.  Review of Systems  Respiratory: Positive for shortness of  breath.   Cardiovascular: Positive for orthopnea and leg swelling. Negative for chest pain and palpitations.  All other systems reviewed and are negative.   All other ROS reviewed and negative.     Physical Exam/Data:   Vitals:   06/13/19 0630 06/13/19 0645 06/13/19 0700 06/13/19 0715  BP: (!) 145/76 (!) 146/81 (!) 157/88   Pulse: 78 77 81 78  Resp: 15 16 18  (!) 23  Temp:      TempSrc:      SpO2: 98% 98% 98% 100%  Weight:       No intake or output data in the 24 hours ending 06/13/19 0735 Last 3 Weights 06/13/2019 05/23/2019 05/23/2019  Weight (lbs) 260 lb 5.8 oz 260 lb 5.8 oz 264 lb 8.8 oz  Weight (kg) 118.1 kg 118.1 kg 120 kg     Body mass index is 37.36 kg/m.  General:  Well nourished, well developed, in no acute distress. On Bipap. HEENT: normal. 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 Left ventricular ejection fraction, by visual estimation, is 60 to 65%. The left ventricle has normal function. Normal left ventricular size. There is moderately increased left ventricular hypertrophy.  2. Definity contrast agent was given IV to delineate the left ventricular endocardial borders.  3. Left  ventricular diastolic Doppler parameters are consistent with impaired relaxation pattern of LV diastolic filling.  4. Global right ventricle has normal systolic function.The right ventricular size is normal. Mildly increased right ventricular wall thickness.  5. Left atrial size was normal.  6. Right atrial size was mildly dilated.  7. The mitral valve is degenerative. Trace mitral valve regurgitation.  8. The tricuspid valve is not well visualized. Tricuspid valve regurgitation is trivial.  9. The aortic valve is tricuspid Aortic valve regurgitation was not visualized by color flow Doppler. Mild to moderate aortic valve sclerosis/calcification without any evidence of aortic stenosis. 10. The pulmonic valve was not well visualized. Pulmonic valve regurgitation is not visualized by color flow Doppler. 11. TR signal is inadequate for assessing pulmonary artery systolic pressure. 12. The inferior vena cava is normal in size with <50% respiratory variability, suggesting right atrial pressure of 8 mmHg. 13. The interatrial septum was not well visualized.   Laboratory Data:  High Sensitivity Troponin:   Recent Labs  Lab 05/23/19 1226 05/23/19 1338 06/13/19 0242  TROPONINIHS 85* 88* 33*     Cardiac EnzymesNo results for input(s): TROPONINI in the last 168 hours. No results for input(s): TROPIPOC in the last 168 hours.  Chemistry Recent Labs  Lab 06/13/19 0242  NA 142  K 7.0*  CL 101  CO2 23  GLUCOSE 168*  BUN 150*  CREATININE 20.99*  CALCIUM 8.3*  GFRNONAA 2*  GFRAA 2*  ANIONGAP 18*    Recent Labs  Lab 06/13/19 0242  PROT 8.8*  ALBUMIN 4.3  AST 67*  ALT 85*  ALKPHOS 81  BILITOT 0.7   Hematology Recent Labs  Lab 06/13/19 0242  WBC 9.8  RBC 3.38*  HGB 10.6*  HCT 33.7*  MCV 99.7  MCH 31.4  MCHC 31.5  RDW 14.6  PLT 209   BNPNo results for input(s): BNP, PROBNP in the last 168 hours.  DDimer No results for input(s): DDIMER in the last 168 hours.    Radiology/Studies:  Dg Chest Portable 1 View  Result Date: 06/13/2019 CLINICAL DATA:  Shortness of breath EXAM: PORTABLE CHEST 1 VIEW COMPARISON:  05/23/2019 FINDINGS: Heart is borderline in size. Low lung volumes. Patchy bilateral airspace opacities. No effusions. No acute bony abnormality. IMPRESSION: Low lung volumes with patchy bilateral airspace opacities concerning for pneumonia. Electronically Signed   By: Rolm Baptise M.D.   On: 06/13/2019 03:06    Assessment and Plan:   Acute hypoxic respiratory failure ESRD with volume overload and hypoxia --Significantly volume overloaded on exam after missing hemodialysis this past Saturday.  Short of breath and on BiPAP due to hypoxia at presentation.  Tremors with hyperkalemia as above. Most recent echo as above with normal EF.  --CXR noted possible pna. As in HPI, previous admission for pna in early October 2020 and cannot exclude as a possible contributing to factor to his SOB and current respiratory status. Agree with abx, started in the ED. --In the ED, placed on IV Lasix 60mg  q12h. --Nephrology following with plan for hemodialysis.   --Defer further volume management to nephrology. --Continue BiPAP, nebs, O2 as needed.  HTN --Poorly controlled.  Patient reports systolic blood pressure elevated into the 107s-190s at home.  He reports medication compliance but does intermittently miss hemodialysis as in HPI.  Continue antihypertensives with titration in respect to HD as suspect BP improvement with improved volume status as well. PTA antihypertensives include amlodipine 10 mg, lisinopril 40 mg, and Toprol 50 mg. ACE is currently on hold due to renal function and potassium. Will continue to monitor.  Hyperkalemia --Current tremors, which patient report are common with his elevated K.  --S/p IV calcium gluconate and nebulized albuterol. --Pending HD as above. --Recommend holding ACE with restart per nephrology. --Daily BMET. Check Mg.     Elevated HS Tn without chest pain --No chest pain, though reportedly placed on nitro in the ED. --High-sensitivity troponin minimally elevated.  Continue to cycle until peaked and downtrending. EKG without acute ST or T changes. --Elevated troponin likely supply demand ischemia in the setting of acute volume overload after missing hemodialysis, end-stage renal disease, hypoxia, elevated blood pressure, and possible underlying pna. --No plan for further ischemic work-up at this time and unless new chest pain, significant bump in troponin, change in repeat EKG.  DM2 --SSI. Per IM.  Possible Pna --Per IM   For questions or updates, please contact [place] Please consult [email or web address] for contact info under     Signed, Arvil Chaco, PA-C  06/13/2019 7:35 AM

## 2019-06-13 NOTE — Progress Notes (Signed)
   06/13/19 1110  Neurological  Level of Consciousness Alert  Orientation Level Oriented X4  Respiratory  Respiratory Pattern Regular  Bilateral Breath Sounds Diminished;Clear (PT ON BIPAP)  Cardiac  Pulse Regular  ECG Monitor Yes  PT AWAKENS EASILY ON BIPAP STABLE FOR HD TX NO C/OS NO DISTRESS NOTED

## 2019-06-13 NOTE — ED Notes (Signed)
[place] (wife) please call with update!

## 2019-06-13 NOTE — Progress Notes (Signed)
   06/13/19 1130  Vital Signs  Temp 98.7 F (37.1 C)  Temp Source Axillary  Pulse Rate 78  Pulse Rate Source Monitor  Resp 17  BP (!) 172/74  Oxygen Therapy  SpO2 98 %  O2 Device Bi-PAP  Pulse Oximetry Type Continuous  Pain Assessment  Pain Scale 0-10  Pain Score 0  Time-Out for Hemodialysis  What Procedure? HEMODIALYSIS  Pt Identifiers(min of two) First/Last Name;MRN/Account#  Correct Site? Yes  Correct Side? Yes  Correct Procedure? Yes  Consents Verified? Yes  Rad Studies Available? N/A  Safety Precautions Reviewed? Yes  Engineer, civil (consulting) Number 1  Station Number 16  UF/Alarm Test Passed  Conductivity: Meter 13.8  Conductivity: Machine  13.9  pH 7.6  Reverse Osmosis 3  Normal Saline Lot Number [id number]  Dialyzer Lot Number [id number]  Disposable Set Lot Number [id number]  Dialysate Acid Bath Lot Number [id number]  Dialysate HCO3 Bath Lot Number [id number]  Machine Temperature 98.6 F (37 C)  Musician and Audible Yes  Blood Lines Intact and Secured Yes  Pre Treatment Patient Checks  Vascular access used during treatment Fistula  Patient is receiving dialysis in a chair  (BED)  Hepatitis B Surface Antigen Results Negative  Date Hepatitis B Surface Antigen Drawn 05/23/19  Isolation Initiated  (NO )  Hepatitis B Surface Antibody  (>1000)  Date Hepatitis B Surface Antibody Drawn 05/23/19  Hemodialysis Consent Verified Yes  Hemodialysis Standing Orders Initiated Yes  ECG (Telemetry) Monitor On Yes  Prime Ordered Normal Saline  Length of  DialysisTreatment -hour(s) 3 Hour(s)  Dialyzer Elisio 17H NR  Dialysate 1K;2.5 Ca  Dialysate Flow Ordered 800  Blood Flow Rate Ordered 400 mL/min  Ultrafiltration Goal 2500 Liters  Dialysis Blood Pressure Support Ordered Normal Saline  PT STABLE FOR TX NO C/OS NO DISTRESS NOTED AVF +/+

## 2019-06-13 NOTE — Progress Notes (Signed)
Initial Nutrition Assessment  DOCUMENTATION CODES:   Obesity unspecified  INTERVENTION:  No appropriate nutrition interventions at this time. Will monitor for adequacy of intake once patient able to come off BiPAP and eat.  Will also follow-up to assess for education needs once patient more stable and off BiPAP.  Once patient able to take PO provide Rena-vite QHS.  NUTRITION DIAGNOSIS:   Inadequate oral intake related to inability to eat as evidenced by (pt on BiPAP).  GOAL:   Patient will meet greater than or equal to 90% of their needs  MONITOR:   Diet advancement, PO intake, Labs, Weight trends, I & O's  REASON FOR ASSESSMENT:   Consult Other (Comment)(new onset heart failure)  ASSESSMENT:   63 year old male with PMHx of DM, ESRD on HD, HTN, GERD, hx MI admitted with SOB after missing HD appointment with severe acute hypoxic and hypercapnic respiratory failure due to pulmonary edema, hyperkalemia.   At time of RD assessment today patient was receiving HD. He was sleeping on BiPAP and unable to provide any history. Patient will be unable to safely take PO until he can come off of BiPAP. RD will continue to monitor for adequacy of intake once patient able to eat. Will also monitor for any education needs once he is more stable.  Patient appears weight-stable per chart for >1 year.  Medications reviewed and include: Phoslo 1334 mg TID, Oscal with D 2 tablets daily, ferric citrate 210 mg TID, Lasix 60 mg BID IV, Levemir 35 untis QHS, Rena-vite on M/W/F, nitroglycerin.  Labs reviewed: CBG 95-121, Potassium 7, BUN 150, Creatinine 20.99. Phosphorus was 7.4 on 10/14.  Patient does not meet criteria for malnutrition at this time.  NUTRITION - FOCUSED PHYSICAL EXAM:    Most Recent Value  Orbital Region  No depletion  Upper Arm Region  No depletion  Thoracic and Lumbar Region  No depletion  Buccal Region  Unable to assess [BiPAP]  Temple Region  No depletion  Clavicle Bone  Region  No depletion  Clavicle and Acromion Bone Region  No depletion  Scapular Bone Region  Unable to assess  Dorsal Hand  No depletion  Patellar Region  No depletion  Anterior Thigh Region  No depletion  Posterior Calf Region  No depletion  Edema (RD Assessment)  Mild  Hair  Reviewed  Eyes  Unable to assess  Mouth  Unable to assess  Skin  Reviewed  Nails  Reviewed     Diet Order:   Diet Order            Diet heart healthy/carb modified Room service appropriate? Yes; Fluid consistency: Thin  Diet effective now             EDUCATION NEEDS:   Not appropriate for education at this time  Skin:  Skin Assessment: Skin Integrity Issues:(wound right foot)  Last BM:  06/13/2019 per chart  Height:   Ht Readings from Last 1 Encounters:  06/13/19 5\' 11"  (1.803 m)   Weight:   Wt Readings from Last 1 Encounters:  06/13/19 118.1 kg   Ideal Body Weight:  78.2 kg  BMI:  Body mass index is 36.31 kg/m.  Estimated Nutritional Needs:   Kcal:  1740-8144  Protein:  115-125 grams  Fluid:  UOP + 1 L  Jacklynn Barnacle, MS, RD, LDN Office: [phone] Pager: [phone] After Hours/Weekend Pager: [phone]

## 2019-06-13 NOTE — Progress Notes (Signed)
This note also relates to the following rows which could not be included: Pulse Rate - Cannot attach notes to unvalidated device data Resp - Cannot attach notes to unvalidated device data BP - Cannot attach notes to unvalidated device data  Hd completed  

## 2019-06-13 NOTE — ED Triage Notes (Signed)
Patient is a T/TH/S dialysis patient, missed dialysis Saturday. Patient with expiratory and inspiratory wheezes all fields. Patient with labored breathing, oxygen saturation level at 82% on RA. Patient speaking in 2-3 word sentences.

## 2019-06-13 NOTE — H&P (Addendum)
[facility] [place] NAME: Donald Silva    MR#:  [id number]  DATE OF BIRTH:  1955-10-26  DATE OF ADMISSION:  06/13/2019  PRIMARY CARE PHYSICIAN: Letta Median, MD   REQUESTING/REFERRING PHYSICIAN: Gonzella Lex, MD CHIEF COMPLAINT:   Chief Complaint  Patient presents with   Shortness of Breath    HISTORY OF PRESENT ILLNESS:  Donald Silva  is a 63 y.o. African-American male with a known history of end-stage renal disease on hemodialysis Tuesday Thursday and Saturday who missed dialysis on Saturday, type 2 diabetes mellitus, hypertension and coronary artery disease, who presented to the emergency room with a consult of dyspnea associated with dry cough and wheezing since yesterday getting significantly worse this evening.  He denied any chest pain or palpitations.  No fever or chills.  No nausea or vomiting or abdominal pain.  No significant worsening lower extremity edema.  He admitted to orthopnea and paroxysmal nocturnal dyspnea as well as dyspnea on exertion.  No recent sick exposure to COVID-19.  Upon presentation to the emergency room, his blood pressure was 165/102 with a temperature of 97.4, respiratory to 28 and O2 sat of 82% on room air was pulse of 83.he patient was placed on BiPAP and sats were up to 100% on 40% FiO2 on BiPAP at 12/8.    Blood pressure has been as high as 219/96.  Labs revealed sodium 142 with a potassium of 7, BUN of 150 with creatinine of 20.99, anion gap of 18 and elevated AST 67, ALT of 85 and total protein was 8.8.  Troponin I was 33 and procalcitonin was 1.06.  CBC showed hemoglobin of 10.6 hematocrit 33.7 which are better than previous levels last month when he was admitted here for similar reasons.  His portable chest x-ray revealed low lung volumes with patchy bilateral airspace opacities concerning for pneumonia.  I also suspect associated pulmonary edema.  COVID-19 test came back negative.  EKG showed sinus rhythm with a  rate of 84, right bundle branch block and left anterior fascicular block and peaked T waves anterolaterally and inferiorly.  The patient was given 60 mg of IV Lasix, D50 insulin, nebulized albuterol, an ampule of sodium bicarbonate and Lokelma in addition to IV nitroglycerin drip.  Dr. Holley Raring was consulted for emergent hemodialysis given the patient's severe hyperkalemia in the setting of missing dialysis on Saturday as well as acute respiratory failure.  He will be admitted to an ICU bed for further evaluation and management.  PAST MEDICAL HISTORY:   Past Medical History:  Diagnosis Date   CKD (chronic kidney disease) stage 5, GFR less than 15 ml/min (HCC)    Diabetes mellitus without complication (HCC)    Dyspnea    GERD (gastroesophageal reflux disease)    Hypertension    Lower extremity edema    Myocardial infarction [facility])     PAST SURGICAL HISTORY:   Past Surgical History:  Procedure Laterality Date   A/V FISTULAGRAM N/A 05/03/2018   Procedure: A/V FISTULAGRAM;  Surgeon: Katha Cabal, MD;  Location: [facility];  Service: Cardiovascular;  Laterality: N/A;   AV FISTULA PLACEMENT Left 08/27/2017   Procedure: ARTERIOVENOUS (AV) FISTULA CREATION ( BRACHIAL CEPHALIC );  Surgeon: Katha Cabal, MD;  Location: [facility];  Service: Vascular;  Laterality: Left;   CHOLECYSTECTOMY     TOE SURGERY Right     SOCIAL HISTORY:   Social History   Tobacco Use   Smoking status:  Never Smoker   Smokeless tobacco: Never Used  Substance Use Topics   Alcohol use: No    FAMILY HISTORY:   Family History  Problem Relation Age of Onset   Hypertension Sister    Diabetes Sister    Diabetes Father     DRUG ALLERGIES:   Allergies  Allergen Reactions   Penicillins Anaphylaxis    Was told it could kill him, pt unsure of reaction  Has patient had a PCN reaction causing immediate rash, facial/tongue/throat swelling, SOB or lightheadedness with hypotension:  No Has patient had a PCN reaction causing severe rash involving mucus membranes or skin necrosis: No Has patient had a PCN reaction that required hospitalization: No Has patient had a PCN reaction occurring within the last 10 years: No If all of the above answers are "NO", then may proceed with Cephalosporin use.    Cyclopentolate Other (See Comments)    REVIEW OF SYSTEMS:   ROS As per history of present illness. All pertinent systems were reviewed above. Constitutional,  HEENT, cardiovascular, respiratory, GI, GU, musculoskeletal, neuro, psychiatric, endocrine,  integumentary and hematologic systems were reviewed and are otherwise  negative/unremarkable except for positive findings mentioned above in the HPI.   MEDICATIONS AT HOME:   Prior to Admission medications   Medication Sig Start Date End Date Taking? Authorizing Provider  lanthanum (FOSRENOL) 1000 MG chewable tablet Chew 3,000 mg by mouth 3 (three) times daily with meals.  05/30/18  Yes [provider]  acetaminophen (MAPAP) 325 MG tablet Take 325 mg by mouth every 4 (four) hours as needed for mild pain.  01/31/19 01/30/20  [provider]  albuterol (PROVENTIL HFA;VENTOLIN HFA) 108 (90 Base) MCG/ACT inhaler Inhale 1-2 puffs into the lungs every 6 (six) hours as needed for wheezing or shortness of breath.  08/17/18 08/17/19  [provider]  amLODipine (NORVASC) 10 MG tablet Take 10 mg by mouth daily.  09/02/18   [provider]  aspirin EC 81 MG tablet Take 81 mg daily by mouth.    [provider]  AURYXIA 1 GM 210 MG(Fe) tablet Take 210 mg by mouth 3 (three) times daily with meals.  05/20/18   [provider]  b complex-vitamin c-folic acid (NEPHRO-VITE) 0.8 MG TABS tablet Take 1 tablet by mouth every Monday, Wednesday, and Friday. After dialysis treatment 03/11/18   [provider]  calcium acetate (PHOSLO) 667 MG capsule Take 1,334 mg by mouth 3 (three) times daily with  meals.  01/28/18   [provider]  calcium carbonate (TUMS EX) 750 MG chewable tablet Chew 2 tablets by mouth at bedtime.    [provider]  clindamycin (CLEOCIN) 300 MG capsule Take 1 capsule (300 mg total) by mouth 3 (three) times daily. Patient not taking: Reported on 06/13/2019 05/05/19   Edrick Kins, DPM  gabapentin (NEURONTIN) 300 MG capsule Take 300 mg by mouth 3 (three) times daily.  07/22/18   [provider]  gentamicin cream (GARAMYCIN) 0.1 % Apply 1 application topically 2 (two) times daily. Patient not taking: Reported on 05/22/2019 10/18/18   Edrick Kins, DPM  heparin 1000 UNIT/ML injection Inject 8,000 Units into the vein every dialysis.  01/17/19 01/16/20  [provider]  Insulin Detemir (LEVEMIR FLEXTOUCH) 100 UNIT/ML Pen Inject 66 Units into the skin at bedtime.  06/01/16   [provider]  lisinopril (ZESTRIL) 40 MG tablet Take 40 mg by mouth daily.  02/05/18   [provider]  metoprolol succinate (  TOPROL-XL) 50 MG 24 hr tablet Take 50 mg by mouth daily.  09/12/18   [provider]  rOPINIRole (REQUIP) 1 MG tablet Take 1 mg by mouth 3 (three) times daily.  03/23/19   [provider]      VITAL SIGNS:  Blood pressure (!) 158/101, pulse 82, temperature (!) 97.4 F (36.3 C), temperature source Oral, resp. rate (!) 26, weight 118.1 kg, SpO2 100 %.  PHYSICAL EXAMINATION:  Physical Exam  GENERAL:  63 y.o.-year-old African-American male patient lying in the bed in moderate respiratory distress on BiPAP. EYES: Pupils equal, round, reactive to light and accommodation. No scleral icterus. Extraocular muscles intact.  HEENT: Head atraumatic, normocephalic. Oropharynx and nasopharynx clear.  NECK:  Supple, no jugular venous distention. No thyroid enlargement, no tenderness.  LUNGS: Diffusely diminished breath sounds with diffuse rales/crackles. CARDIOVASCULAR: Regular rate and rhythm, S1, S2 normal. No murmurs,  rubs, or gallops.  ABDOMEN: Soft, nondistended, nontender. Bowel sounds present. No organomegaly or mass.  EXTREMITIES: 1-2+ bilateral lower extremity pitting edema with no clubbing or cyanosis. NEUROLOGIC: Cranial nerves II through XII are intact. Muscle strength 5/5 in all extremities. Sensation intact. Gait not checked.  PSYCHIATRIC: The patient is alert and oriented x 3.  Normal affect and good eye contact. SKIN: No obvious rash, lesion, or ulcer.   LABORATORY PANEL:   CBC Recent Labs  Lab 06/13/19 0242  WBC 9.8  HGB 10.6*  HCT 33.7*  PLT 209   ------------------------------------------------------------------------------------------------------------------  Chemistries  Recent Labs  Lab 06/13/19 0242  NA 142  K 7.0*  CL 101  CO2 23  GLUCOSE 168*  BUN 150*  CREATININE 20.99*  CALCIUM 8.3*  AST 67*  ALT 85*  ALKPHOS 81  BILITOT 0.7   ------------------------------------------------------------------------------------------------------------------  Cardiac Enzymes No results for input(s): TROPONINI in the last 168 hours. ------------------------------------------------------------------------------------------------------------------  RADIOLOGY:  Dg Chest Portable 1 View  Result Date: 06/13/2019 CLINICAL DATA:  Shortness of breath EXAM: PORTABLE CHEST 1 VIEW COMPARISON:  05/23/2019 FINDINGS: Heart is borderline in size. Low lung volumes. Patchy bilateral airspace opacities. No effusions. No acute bony abnormality. IMPRESSION: Low lung volumes with patchy bilateral airspace opacities concerning for pneumonia. Electronically Signed   By: Rolm Baptise M.D.   On: 06/13/2019 03:06      IMPRESSION AND PLAN:   1.  Acute hypoxic respiratory failure, secondary to acute on chronic diastolic CHF with fluid overload secondary to missing hemodialysis in the setting of end-stage renal disease.  The patient will be continued on BiPAP and admitted to an ICU bed.  Nephrology  consultation will be obtained for emergent hemodialysis.  I notified Dr. Holley Raring.  He was also called by the ER physician.  We will continue the patient on IV nitroglycerin drip and obtain a cardiology consultation as well by Dr. Fletcher Anon.  I notified him regarding the consult.  The patient will be placed on IV Lasix 60 mg every 12 hours, mainly for pulmonary congestion.  Patient had a recent 2D echo which showed EF of 60-65% on 05/23/2019.  Given elevated procalcitonin, one cannot rule out associated pneumonia and therefore will add IV Levaquin after obtaining blood and sputum cultures for the possibility of underlying community-acquired pneumonia contributing to his respiratory failure.  We will add mucolytic's as well as as needed and scheduled duo nebs.  2.  Severe hyperkalemia.  The patient was managed in the ER as mentioned above.  I added IV calcium gluconate and an additional nebulized albuterol for now.  He  is due for hemodialysis as mentioned above.  Zestril be held off.  Phosphate level will be added.  3.  Hypertensive urgency.  This is likely contributing to his acute CHF.  He is being placed on IV nitroglycerin drip that will be titrated for pulmonary congestion blood pressure control.  We will continue Toprol-XL, amlodipine and hold off Zestril.  4.  Elevated troponin I.  This likely demand ischemia due to his acute CHF and fluid overload as well as hypertensive urgency.  We will follow serial troponin levels.  5.  Type 2 diabetes mellitus.  The patient will be placed on supplemental coverage with NovoLog and will continue basal coverage with Levemir.  6.  Coronary disease.  We will continue beta-blocker therapy as well as aspirin.  5.  DVT prophylaxis.  Subcutaneous heparin.  All the records are reviewed and case discussed with ED provider. The plan of care was discussed in details with the patient (and family). I answered all questions. The patient agreed to proceed with the above  mentioned plan. Further management will depend upon hospital course.   CODE STATUS: Full code  TOTAL CRITICAL CARE TIME TAKING CARE OF THIS PATIENT: 80 minutes.   Christel Mormon M.D on 06/13/2019 at 5:18 AM  [facility]   From 7 PM-7 AM, contact night-coverage [email or web address]  CC: Primary care physician; Letta Median, MD   Note: This dictation was prepared with Dragon dictation along with smaller phrase technology. Any transcriptional errors that result from this process are unintentional.

## 2019-06-13 NOTE — ED Provider Notes (Signed)
[facility] Emergency Department Provider Note  ____________________________________________  Time seen: Approximately 2:56 AM  I have reviewed the triage vital signs and the nursing notes.   HISTORY  Chief Complaint Shortness of Breath   HPI Donald Silva is a 63 y.o. male with h/o ESRD on HD, HTN, DM, CAD who presents for evaluation of shortness of breath.  Patient reports missing his last dialysis on Saturday.  Last dialysis was 5 days ago.  Started having shortness of breath this evening.  No chest pain, no cough, no body aches, no fever or chills, no vomiting or diarrhea.  He is complaining of severe shortness of breath that is constant and started this evening.  No prior history of PE or DVT.  No known exposures to Covid.   Past Medical History:  Diagnosis Date  . CKD (chronic kidney disease) stage 5, GFR less than 15 ml/min (HCC)   . Diabetes mellitus without complication ([place])   . Dyspnea   . GERD (gastroesophageal reflux disease)   . Hypertension   . Lower extremity edema   . Myocardial infarction [facility])     Patient Active Problem List   Diagnosis Date Noted  . Acute CHF (congestive heart failure) ([place]) 05/22/2019  . Postop check 03/02/2019  . Diabetes 1.5, managed as type 2 ([place]) 12/15/2018  . Complication of vascular access for dialysis 04/20/2018  . Chronic kidney disease, stage V ([place]) 08/11/2017  . Essential hypertension 08/11/2017  . 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[place]) 08/11/2017  . Hyperkalemia 06/24/2017  . Multiple-type hyperlipidemia 03/25/2012  . Vitamin D deficiency 03/25/2012  . ED (erectile dysfunction) of organic origin 09/14/2011    Past Surgical History:  Procedure Laterality Date  . A/V FISTULAGRAM N/A 05/03/2018   Procedure: A/V FISTULAGRAM;  Surgeon: Katha Cabal, MD;  Location: [place] [facility];  Service: Cardiovascular;  Laterality: N/A;  . AV FISTULA PLACEMENT Left 08/27/2017   Procedure: ARTERIOVENOUS (AV) FISTULA CREATION ( BRACHIAL CEPHALIC );  Surgeon: Katha Cabal, MD;  Location: [facility] ORS;  Service: Vascular;  Laterality: Left;  . CHOLECYSTECTOMY    . TOE SURGERY Right     Prior to Admission medications   Medication Sig Start Date End Date Taking? Authorizing Provider  acetaminophen (MAPAP) 325 MG tablet Take 325 mg by mouth every 4 (four) hours as needed for mild pain.  01/31/19 01/30/20  [provider]  albuterol (PROVENTIL HFA;VENTOLIN HFA) 108 (90 Base) MCG/ACT inhaler Inhale 1-2 puffs into the lungs every 6 (six) hours as needed for wheezing or shortness of breath.  08/17/18 08/17/19  [provider]  amLODipine (NORVASC) 10 MG tablet Take 10 mg by mouth daily.  09/02/18   [provider]  aspirin EC 81 MG tablet Take 81 mg daily by mouth.    [provider]  AURYXIA 1 GM 210 MG(Fe) tablet Take 210 mg by mouth 3 (three) times daily with meals.  05/20/18   [provider]  b complex-vitamin c-folic acid (NEPHRO-VITE) 0.8 MG TABS tablet Take 1 tablet by mouth every Monday, Wednesday, and Friday. After dialysis treatment 03/11/18   [provider]  calcium acetate (PHOSLO) 667 MG capsule Take 1,334 mg by mouth 3 (three) times daily with meals.  01/28/18   [provider]  calcium carbonate (TUMS EX) 750 MG chewable tablet Chew 2 tablets by mouth at bedtime.    [provider]  clindamycin (CLEOCIN) 300 MG capsule Take 1 capsule (300  mg total) by mouth 3 (three) times daily. Patient not taking: Reported on 06/13/2019 05/05/19   Edrick Kins, DPM  gabapentin (NEURONTIN) 300 MG capsule Take 300 mg by mouth 3 (three) times daily.  07/22/18   [provider]  gentamicin cream (GARAMYCIN) 0.1 % Apply 1 application topically 2 (two) times daily. Patient not taking: Reported on 05/22/2019 10/18/18   Edrick Kins, DPM  heparin 1000 UNIT/ML injection Inject 8,000 Units into the vein every  dialysis.  01/17/19 01/16/20  [provider]  Insulin Detemir (LEVEMIR FLEXTOUCH) 100 UNIT/ML Pen Inject 66 Units into the skin at bedtime.  06/01/16   [provider]  lanthanum (FOSRENOL) 1000 MG chewable tablet Chew 1,000 mg by mouth 3 (three) times daily with meals.  05/30/18   [provider]  lisinopril (ZESTRIL) 40 MG tablet Take 40 mg by mouth daily.  02/05/18   [provider]  metoprolol succinate (TOPROL-XL) 50 MG 24 hr tablet Take 50 mg by mouth daily.  09/12/18   [provider]  rOPINIRole (REQUIP) 1 MG tablet Take 1 mg by mouth 3 (three) times daily.  03/23/19   [provider]    Allergies Penicillins and Cyclopentolate  Family History  Problem Relation Age of Onset  . Hypertension Sister   . Diabetes Sister   . Diabetes Father     Social History Social History   Tobacco Use  . Smoking status: Never Smoker  . Smokeless tobacco: Never Used  Substance Use Topics  . Alcohol use: No  . Drug use: No    Review of Systems  Constitutional: Negative for fever. Eyes: Negative for visual changes. ENT: Negative for sore throat. Neck: No neck pain  Cardiovascular: Negative for chest pain. Respiratory: + shortness of breath. Gastrointestinal: Negative for abdominal pain, vomiting or diarrhea. Genitourinary: Negative for dysuria. Musculoskeletal: Negative for back pain. Skin: Negative for rash. Neurological: Negative for headaches, weakness or numbness. Psych: No SI or HI  ____________________________________________   PHYSICAL EXAM:  VITAL SIGNS: ED Triage Vitals  Enc Vitals Group     BP 06/13/19 0228 (!) 165/102     Pulse Rate 06/13/19 0227 83     Resp 06/13/19 0227 (!) 28     Temp 06/13/19 0227 (!) 97.4 F (36.3 C)     Temp Source 06/13/19 0227 Oral     SpO2 06/13/19 0227 (!) 82 %     Weight 06/13/19 0228 260 lb 5.8 oz (118.1 kg)     Height --      Head Circumference --      Peak Flow --      Pain Score  06/13/19 0228 0     Pain Loc --      Pain Edu? --      Excl. in [place]? --     Constitutional: Alert and oriented, moderate respiratory distress  HEENT:      Head: Normocephalic and atraumatic.         Eyes: Conjunctivae are normal. Sclera is non-icteric.       Mouth/Throat: Mucous membranes are moist.       Neck: Supple with no signs of meningismus. Cardiovascular: Regular rate and rhythm. No murmurs, gallops, or rubs. 2+ symmetrical distal pulses are present in all extremities. No JVD. Respiratory: Moderate respiratory distress, tachypneic, hypoxic to 82% on room air, diffuse expiratory and inspiratory wheezes throughout  gastrointestinal: Soft, non tender, and non distended with positive bowel sounds. No rebound or guarding. Musculoskeletal: Trace  pitting edema bilaterally Neurologic: Normal speech and language. Face is symmetric. Moving all extremities. No gross focal neurologic deficits are appreciated. Skin: Skin is warm, dry and intact. No rash noted. Psychiatric: Mood and affect are normal. Speech and behavior are normal.  ____________________________________________   LABS (all labs ordered are listed, but only abnormal results are displayed)  Labs Reviewed  CBC WITH DIFFERENTIAL/PLATELET - Abnormal; Notable for the following components:      Result Value   RBC 3.38 (*)    Hemoglobin 10.6 (*)    HCT 33.7 (*)    Neutro Abs 8.2 (*)    All other components within normal limits  COMPREHENSIVE METABOLIC PANEL - Abnormal; Notable for the following components:   Potassium 7.0 (*)    Glucose, Bld 168 (*)    BUN 150 (*)    Creatinine, Ser 20.99 (*)    Calcium 8.3 (*)    Total Protein 8.8 (*)    AST 67 (*)    ALT 85 (*)    GFR calc non Af Amer 2 (*)    GFR calc Af Amer 2 (*)    Anion gap 18 (*)    All other components within normal limits  TROPONIN I (HIGH SENSITIVITY) - Abnormal; Notable for the following components:   Troponin I (High Sensitivity) 33 (*)    All other  components within normal limits  SARS CORONAVIRUS 2 BY RT PCR (HOSPITAL ORDER, [facility])  PROCALCITONIN   ____________________________________________  EKG  ED ECG REPORT I, Rudene Re, the attending physician, personally viewed and interpreted this ECG.  Normal sinus rhythm, rate of 84, prolonged QTC, left axis deviation, right bundle branch block, no ST elevations or depressions.  Unchanged from prior. ____________________________________________  RADIOLOGY  I have personally reviewed the images performed during this visit and I agree with the Radiologist's read.   Interpretation by Radiologist:  Dg Chest Portable 1 View  Result Date: 06/13/2019 CLINICAL DATA:  Shortness of breath EXAM: PORTABLE CHEST 1 VIEW COMPARISON:  05/23/2019 FINDINGS: Heart is borderline in size. Low lung volumes. Patchy bilateral airspace opacities. No effusions. No acute bony abnormality. IMPRESSION: Low lung volumes with patchy bilateral airspace opacities concerning for pneumonia. Electronically Signed   By: Rolm Baptise M.D.   On: 06/13/2019 03:06      ____________________________________________   PROCEDURES  Procedure(s) performed: None Procedures Critical Care performed: yes  CRITICAL CARE Performed by: Rudene Re  ?  Total critical care time: 40 min  Critical care time was exclusive of separately billable procedures and treating other patients.  Critical care was necessary to treat or prevent imminent or life-threatening deterioration.  Critical care was time spent personally by me on the following activities: development of treatment plan with patient and/or surrogate as well as nursing, discussions with consultants, evaluation of patient's response to treatment, examination of patient, obtaining history from patient or surrogate, ordering and performing treatments and interventions, ordering and review of laboratory studies, ordering and  review of radiographic studies, pulse oximetry and re-evaluation of patient's condition.  ____________________________________________   INITIAL IMPRESSION / ASSESSMENT AND PLAN / ED COURSE   63 y.o. male with h/o ESRD on HD, HTN, DM, CAD who presents for evaluation of shortness of breath after missing dialysis.  Last dialysis treatment was 5 days ago.  Patient arrives in moderate respiratory distress, using accessory muscles of respiration, hypoxic to 82% on room air with diffuse expiratory and inspiratory wheezes throughout.  Patient severely hypertensive  with systolics between 939 03E.  Patient reports that he still makes a little bit of urine.  Patient was placed on BiPAP immediately and started on a nitro drip, he was given 60 mg of IV Lasix.  Chest x-ray concerning for pulmonary edema.  EKG showing no acute changes.  Labs are pending.  Nephrology has been paged for emergent dialysis.  Clinical Course as of Jun 12 418  Tue Jun 13, 2019  0402 Chemistries still not back. Discussed with lab, 15 more min. Patient is much more comfortable on BiPAP. Discussed with Dr. Holley Raring for emergent dialysis. He will call the HD nurse. Hospitalist to admit once COVID is resulted.    [CV]    Clinical Course User Index [CV] Alfred Levins, [place], MD    _________________________ 4:18 AM on 06/13/2019 -----------------------------------------  K 7.0 with no EKG changes.  Will give Lokelma, D50, insulin, bicarbonate, albuterol.  Patient has already received 60 mg of IV Lasix.  Radiology reading chest x-ray is pneumonia but patient has no's infectious etiology, no cough, no fever, no white count, Covid negative.  His symptoms are consistent with pulmonary edema.  As part of my medical decision making, I reviewed the following data within the [facility] notes reviewed and incorporated, Labs reviewed , EKG interpreted , Old EKG reviewed, Old chart reviewed, Radiograph reviewed , Discussed  with admitting physician , A consult was requested and obtained from this/these consultant(s) nephrology, Notes from prior ED visits and  Controlled Substance Database   Patient was evaluated in Emergency Department today for the symptoms described in the history of present illness. Patient was evaluated in the context of the global COVID-19 pandemic, which necessitated consideration that the patient might be at risk for infection with the SARS-CoV-2 virus that causes COVID-19. Institutional protocols and algorithms that pertain to the evaluation of patients at risk for COVID-19 are in a state of rapid change based on information released by regulatory bodies including the CDC and federal and state organizations. These policies and algorithms were followed during the patient's care in the ED.   ____________________________________________   FINAL CLINICAL IMPRESSION(S) / ED DIAGNOSES   Final diagnoses:  Flash pulmonary edema ([place])  Acute respiratory failure with hypoxia ([place])  Hyperkalemia      NEW MEDICATIONS STARTED DURING THIS VISIT:  ED Discharge Orders    None       Note:  This document was prepared using Dragon voice recognition software and may include unintentional dictation errors.    Rudene Re, MD 06/13/19 [phone]

## 2019-06-13 NOTE — Progress Notes (Signed)
Pharmacy Electrolyte Monitoring Consult:  Pharmacy consulted to assist in monitoring and replacing electrolytes in this 63 y.o. male admitted on 06/13/2019 with hyperkalemia.   Labs:  Sodium (mmol/L)  Date Value  06/13/2019 142  11/25/2013 138   Potassium (mmol/L)  Date Value  06/13/2019 7.0 (HH)  11/25/2013 4.3   Magnesium (mg/dL)  Date Value  05/24/2019 2.3   Phosphorus (mg/dL)  Date Value  05/24/2019 7.4 (H)   Calcium (mg/dL)  Date Value  06/13/2019 8.3 (L)   Calcium, Total (mg/dL)  Date Value  11/25/2013 8.5   Albumin (g/dL)  Date Value  06/13/2019 4.3  11/22/2013 2.8 (L)    Assessment/Plan: Patient with dialysis today. No replacement warranted. Potassium recheck scheduled. BMP with am labs.   Pharmacy will continue to monitor and adjust per consult.   Simpson,Michael L 06/13/2019 5:18 PM

## 2019-06-13 NOTE — Progress Notes (Signed)
[facility]  ROUNDING NOTE   Subjective:  Patient well-known to [place] from prior admission. Currently on BiPAP. Missed dialysis on Saturday. Came in with significant hyperkalemia with a serum potassium of 7.0. Undergoing dialysis at the moment.   Objective:  Vital signs in last 24 hours:  Temp:  [97.4 F (36.3 C)-98.8 F (37.1 C)] 98.8 F (37.1 C) (11/03 0815) Pulse Rate:  [74-86] 79 (11/03 1145) Resp:  [14-28] 24 (11/03 1145) BP: (129-219)/(64-114) 177/75 (11/03 1145) SpO2:  [82 %-100 %] 99 % (11/03 1145) FiO2 (%):  [40 %] 40 % (11/03 0615) Weight:  [118.1 kg] 118.1 kg (11/03 0228)  Weight change:  Filed Weights   06/13/19 0228  Weight: 118.1 kg    Intake/Output: No intake/output data recorded.   Intake/Output this shift:  Total I/O In: 140.2 [I.V.:98.4; IV Piggyback:41.8] Out: -   Physical Exam: General: Critically ill-appearing  Head: Normocephalic, atraumatic. Moist oral mucosal membranes  Eyes: Anicteric  Neck: Supple, trachea midline  Lungs:  Scattered rhonchi, on bipap  Heart: S1S2 no rubs  Abdomen:  Soft, nontender, bowel sounds present  Extremities: 1+ peripheral edema.  Neurologic: Awake, will follow commands  Skin: No lesions  Access: LUE AVF    Basic Metabolic Panel: Recent Labs  Lab 06/13/19 0242  NA 142  K 7.0*  CL 101  CO2 23  GLUCOSE 168*  BUN 150*  CREATININE 20.99*  CALCIUM 8.3*    Liver Function Tests: Recent Labs  Lab 06/13/19 0242  AST 67*  ALT 85*  ALKPHOS 81  BILITOT 0.7  PROT 8.8*  ALBUMIN 4.3   No results for input(s): LIPASE, AMYLASE in the last 168 hours. No results for input(s): AMMONIA in the last 168 hours.  CBC: Recent Labs  Lab 06/13/19 0242  WBC 9.8  NEUTROABS 8.2*  HGB 10.6*  HCT 33.7*  MCV 99.7  PLT 209    Cardiac Enzymes: No results for input(s): CKTOTAL, CKMB, CKMBINDEX, TROPONINI in the last 168 hours.  BNP: Invalid input(s): POCBNP  CBG: Recent Labs  Lab 06/13/19 0754   GLUCAP 95    Microbiology: Results for orders placed or performed during the hospital encounter of 06/13/19  SARS Coronavirus 2 by RT PCR (hospital order, performed in [facility] hospital lab) Nasopharyngeal Nasopharyngeal Swab     Status: None   Collection Time: 06/13/19  2:42 AM   Specimen: Nasopharyngeal Swab  Result Value Ref Range Status   SARS Coronavirus 2 NEGATIVE NEGATIVE Final    Comment: (NOTE) If result is NEGATIVE SARS-CoV-2 target nucleic acids are NOT DETECTED. The SARS-CoV-2 RNA is generally detectable in upper and lower  respiratory specimens during the acute phase of infection. The lowest  concentration of SARS-CoV-2 viral copies this assay can detect is 250  copies / mL. A negative result does not preclude SARS-CoV-2 infection  and should not be used as the sole basis for treatment or other  patient management decisions.  A negative result may occur with  improper specimen collection / handling, submission of specimen other  than nasopharyngeal swab, presence of viral mutation(s) within the  areas targeted by this assay, and inadequate number of viral copies  (<250 copies / mL). A negative result must be combined with clinical  observations, patient history, and epidemiological information. If result is POSITIVE SARS-CoV-2 target nucleic acids are DETECTED. The SARS-CoV-2 RNA is generally detectable in upper and lower  respiratory specimens dur ing the acute phase of infection.  Positive  results are indicative  of active infection with SARS-CoV-2.  Clinical  correlation with patient history and other diagnostic information is  necessary to determine patient infection status.  Positive results do  not rule out bacterial infection or co-infection with other viruses. If result is PRESUMPTIVE POSTIVE SARS-CoV-2 nucleic acids MAY BE PRESENT.   A presumptive positive result was obtained on the submitted specimen  and confirmed on repeat testing.  While 2019 novel  coronavirus  (SARS-CoV-2) nucleic acids may be present in the submitted sample  additional confirmatory testing may be necessary for epidemiological  and / or clinical management purposes  to differentiate between  SARS-CoV-2 and other Sarbecovirus currently known to infect humans.  If clinically indicated additional testing with an alternate test  methodology [phone]) is advised. The SARS-CoV-2 RNA is generally  detectable in upper and lower respiratory sp ecimens during the acute  phase of infection. The expected result is Negative. Fact Sheet for Patients:  StrictlyIdeas.no Fact Sheet for Healthcare Providers: BankingDealers.co.za This test is not yet approved or cleared by the [place] FDA and has been authorized for detection and/or diagnosis of SARS-CoV-2 by FDA under an Emergency Use Authorization (EUA).  This EUA will remain in effect (meaning this test can be used) for the duration of the COVID-19 declaration under Section 564(b)(1) of the Act, 21 U.S.C. section 360bbb-3(b)(1), unless the authorization is terminated or revoked sooner. Performed at [facility], [address] [place], [place] [address]   MRSA PCR Screening     Status: None   Collection Time: 06/13/19  7:57 AM   Specimen: Nasal Mucosa; Nasopharyngeal  Result Value Ref Range Status   MRSA by PCR NEGATIVE NEGATIVE Final    Comment:        The GeneXpert MRSA Assay (FDA approved for NASAL specimens only), is one component of a comprehensive MRSA colonization surveillance program. It is not intended to diagnose MRSA infection nor to guide or monitor treatment for MRSA infections. Performed at [facility], [address] [place], [place] [address]     Coagulation Studies: No results for input(s): LABPROT, INR in the last 72 hours.  Urinalysis: No results for input(s): COLORURINE, LABSPEC, PHURINE, GLUCOSEU, HGBUR, BILIRUBINUR,  KETONESUR, PROTEINUR, UROBILINOGEN, NITRITE, LEUKOCYTESUR in the last 72 hours.  Invalid input(s): APPERANCEUR    Imaging: Dg Chest Portable 1 View  Result Date: 06/13/2019 CLINICAL DATA:  Shortness of breath EXAM: PORTABLE CHEST 1 VIEW COMPARISON:  05/23/2019 FINDINGS: Heart is borderline in size. Low lung volumes. Patchy bilateral airspace opacities. No effusions. No acute bony abnormality. IMPRESSION: Low lung volumes with patchy bilateral airspace opacities concerning for pneumonia. Electronically Signed   By: Rolm Baptise M.D.   On: 06/13/2019 03:06     Medications:   . sodium chloride    . nitroGLYCERIN 40 mcg/min (06/13/19 1000)   . albuterol  2.5 mg Nebulization Once  . amLODipine  10 mg Oral Daily  . aspirin EC  81 mg Oral Daily  . calcium acetate  1,334 mg Oral TID WC  . calcium-vitamin D  2 tablet Oral QHS  . Chlorhexidine Gluconate Cloth  6 each Topical Q0600  . ferric citrate  210 mg Oral TID WC  . furosemide  60 mg Intravenous BID  . guaiFENesin  600 mg Oral BID  . heparin  5,000 Units Subcutaneous Q8H  . insulin detemir  35 Units Subcutaneous QHS  . lanthanum  3,000 mg Oral TID WC  . metoprolol succinate  50 mg Oral Daily  . [  START ON 06/14/2019] multivitamin  1 tablet Oral Once per day on Mon Wed Fri  . rOPINIRole  1 mg Oral TID  . sodium chloride flush  3 mL Intravenous Q12H   sodium chloride, acetaminophen, ipratropium-albuterol, ondansetron (ZOFRAN) IV, sodium chloride flush, zolpidem  Assessment/ Plan:  63 y.o. male with past medical history of ESRD on HD TTS, diabetes mellitus type 2, hypertension, anemia chronic kidney disease, secondary hyperparathyroidism who was admitted shortness of breath placed on BiPAP after having his dialysis on Saturday.  [facility]/[address]/TTHS/EDW 114  1.  ESRD on HD TTS/hyperkalemia.  Patient with severe hyperkalemia with a potassium of 7.0.  He is currently undergoing hemodialysis.  Tolerating well.  Continue to  monitor serum potassium over the course of the hospitalization.  2.  Acute respiratory failure.  Likely due to pulmonary edema.  On BiPAP at the moment.  Continue ultrafiltration with dialysis as well.  3.  Secondary hyperparathyroidism.  Patient currently on 3 binders including calcium acetate, Auryxia, and Fosrenol.  Check serum phosphorus with dialysis treatment today.  4.  Anemia chronic kidney disease.  Hemoglobin currently 10.6.  No urgent indication for Epogen at the moment.   LOS: 0 Jini Horiuchi 11/3/202011:49 AM

## 2019-06-13 NOTE — Consult Note (Addendum)
Name: Donald Silva MRN: [id number] DOB: 1955/09/09     CONSULTATION DATE: 06/13/2019  REFERRING MD :  Dr. Alfred Levins, MD - [facility] Emergency Department   CHIEF COMPLAINT: Acute hypoxic respiratory failure and hyperkalemia    HISTORY OF PRESENT ILLNESS:  Donald Silva is a 63 year old male who presented to the ED with SOB after missing his dialysis appointment on Saturday. Upon arrival at the ED, pt was working to breathe and had an SpO2 of 82%, as well as hypertension with a SBP of over 200. Pt was placed on BiPAP and a nitro drip in the ED. He was also given 60 mg of IV lasix. CXR showed pulmonary edema and a possible pneumonia in the absence of a cough, fever and elevated WBC. IV Levoquin administered due to CXR results and elevated procalcitonin. CMP showed a potassium level of 7, BUN of 150, creatinine of 20.99 and a GFR of 2.  Pt was given D50 insulin, nebulized albuterol, sodium bicarbonate and lokelma for hyperkalemia. He has been admitted to the ICU for further monitoring of his respiratory failure and kidney function. He continues to be on BiPAP and is receiving hemodialysis in the ICU.   PAST MEDICAL HISTORY :   has a past medical history of CKD (chronic kidney disease) stage 5, GFR less than 15 ml/min (HCC), Diabetes mellitus without complication ([place]), Dyspnea, GERD (gastroesophageal reflux disease), Hypertension, Lower extremity edema, and Myocardial infarction ([place]).  has a past surgical history that includes Cholecystectomy; Toe Surgery (Right); AV fistula placement (Left, 08/27/2017); and A/V Fistulagram (N/A, 05/03/2018). Prior to Admission medications   Medication Sig Start Date End Date Taking? Authorizing Provider  albuterol (PROVENTIL HFA;VENTOLIN HFA) 108 (90 Base) MCG/ACT inhaler Inhale 1-2 puffs into the lungs every 6 (six) hours as needed for wheezing or shortness of breath.  08/17/18 08/17/19 Yes [provider]  amLODipine (NORVASC) 10 MG tablet Take 10 mg by mouth daily.   09/02/18  Yes [provider]  aspirin EC 81 MG tablet Take 81 mg daily by mouth.   Yes [provider]  AURYXIA 1 GM 210 MG(Fe) tablet Take 210 mg by mouth 3 (three) times daily with meals.  05/20/18  Yes [provider]  b complex-vitamin c-folic acid (NEPHRO-VITE) 0.8 MG TABS tablet Take 1 tablet by mouth every Monday, Wednesday, and Friday. After dialysis treatment 03/11/18  Yes [provider]  calcium acetate (PHOSLO) 667 MG capsule Take 1,334 mg by mouth 3 (three) times daily with meals.  01/28/18  Yes [provider]  calcium carbonate (TUMS EX) 750 MG chewable tablet Chew 2 tablets by mouth at bedtime.   Yes [provider]  gabapentin (NEURONTIN) 300 MG capsule Take 300 mg by mouth 3 (three) times daily.  07/22/18  Yes [provider]  Insulin Detemir (LEVEMIR FLEXTOUCH) 100 UNIT/ML Pen Inject 66 Units into the skin at bedtime.  06/01/16  Yes [provider]  lanthanum (FOSRENOL) 1000 MG chewable tablet Chew 3,000 mg by mouth 3 (three) times daily with meals.  05/30/18  Yes [provider]  lisinopril (ZESTRIL) 40 MG tablet Take 40 mg by mouth daily.  02/05/18  Yes [provider]  metoprolol succinate (TOPROL-XL) 50 MG 24 hr tablet Take 50 mg by mouth daily.  09/12/18  Yes [provider]  rOPINIRole (REQUIP) 1 MG tablet Take 1 mg by mouth 3 (three) times daily.  03/23/19  Yes [provider]  acetaminophen (MAPAP) 325 MG tablet Take  325 mg by mouth every 4 (four) hours as needed for mild pain.  01/31/19 01/30/20  [provider]  clindamycin (CLEOCIN) 300 MG capsule Take 1 capsule (300 mg total) by mouth 3 (three) times daily. Patient not taking: Reported on 06/13/2019 05/05/19   Edrick Kins, DPM  gentamicin cream (GARAMYCIN) 0.1 % Apply 1 application topically 2 (two) times daily. Patient not taking: Reported on 05/22/2019 10/18/18   Edrick Kins, DPM  heparin 1000 UNIT/ML  injection Inject 8,000 Units into the vein every dialysis.  01/17/19 01/16/20  [provider]   Allergies  Allergen Reactions  . Penicillins Anaphylaxis    Was told it could kill him, pt unsure of reaction  Has patient had a PCN reaction causing immediate rash, facial/tongue/throat swelling, SOB or lightheadedness with hypotension: No Has patient had a PCN reaction causing severe rash involving mucus membranes or skin necrosis: No Has patient had a PCN reaction that required hospitalization: No Has patient had a PCN reaction occurring within the last 10 years: No If all of the above answers are "NO", then may proceed with Cephalosporin use.   . Cyclopentolate Other (See Comments)    FAMILY HISTORY:  family history includes Diabetes in his father and sister; Hypertension in his sister. SOCIAL HISTORY:  reports that he has never smoked. He has never used smokeless tobacco. He reports that he does not drink alcohol or use drugs.  REVIEW OF SYSTEMS:     Review of Systems  Constitutional: Negative for chills and fever.       Pt reports he feels better since being admitted  Eyes: Negative for blurred vision and double vision.  Respiratory: Negative for cough and shortness of breath.   Cardiovascular: Negative for chest pain and palpitations.  Gastrointestinal: Negative for abdominal pain, constipation, diarrhea, nausea and vomiting.  Genitourinary: Negative for dysuria.  Neurological: Negative for dizziness, tingling, sensory change, weakness and headaches.      VITAL SIGNS: Temp:  [97.4 F (36.3 C)-98.8 F (37.1 C)] 98.7 F (37.1 C) (11/03 1130) Pulse Rate:  [74-86] 81 (11/03 1215) Resp:  [14-28] 17 (11/03 1215) BP: (129-219)/(64-114) 145/79 (11/03 1215) SpO2:  [82 %-100 %] 99 % (11/03 1215) FiO2 (%):  [40 %] 40 % (11/03 0615) Weight:  [118.1 kg] 118.1 kg (11/03 0228)   No intake/output data recorded. Total I/O In: 140.2 [I.V.:98.4; IV Piggyback:41.8] Out: -     SpO2: 99 % FiO2 (%): 40 %   Physical Examination:  Physical Exam  Constitutional: He is oriented to person, place, and time and well-developed, well-nourished, and in no distress.  Pt sleeping with BiPAP on. Currently receiving hemodialysis.   HENT:  Head: Normocephalic and atraumatic.  Eyes: Pupils are equal, round, and reactive to light. Conjunctivae are normal.  Neck: Neck supple.  Cardiovascular: Normal rate, regular rhythm and normal heart sounds. Exam reveals no gallop and no friction rub.  No murmur heard. Pulmonary/Chest: He is in respiratory distress. He has wheezes.  Inspiratory and expiratory wheezes bilaterally   Abdominal: Bowel sounds are normal. He exhibits no distension. There is no abdominal tenderness.  Musculoskeletal:        General: No edema.     Comments: Bandage wrap on right foot. 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   Neurological: He is alert and oriented to person, place, and time.  Skin: Skin is warm and dry.     I personally reviewed lab work that was obtained in last 24 hrs. CXR Independently reviewed- Agree  with radiology findings: lower lung opacities and borderline cardiomegaly   MEDICATIONS: I have reviewed all medications and confirmed regimen as documented   CULTURE RESULTS   Recent Results (from the past 240 hour(s))  SARS Coronavirus 2 by RT PCR (hospital order, performed in [facility] hospital lab) Nasopharyngeal Nasopharyngeal Swab     Status: None   Collection Time: 06/13/19  2:42 AM   Specimen: Nasopharyngeal Swab  Result Value Ref Range Status   SARS Coronavirus 2 NEGATIVE NEGATIVE Final    Comment: (NOTE) If result is NEGATIVE SARS-CoV-2 target nucleic acids are NOT DETECTED. The SARS-CoV-2 RNA is generally detectable in upper and lower  respiratory specimens during the acute phase of infection. The lowest  concentration of SARS-CoV-2 viral copies this assay can detect is 250  copies / mL. A negative result does not preclude  SARS-CoV-2 infection  and should not be used as the sole basis for treatment or other  patient management decisions.  A negative result may occur with  improper specimen collection / handling, submission of specimen other  than nasopharyngeal swab, presence of viral mutation(s) within the  areas targeted by this assay, and inadequate number of viral copies  (<250 copies / mL). A negative result must be combined with clinical  observations, patient history, and epidemiological information. If result is POSITIVE SARS-CoV-2 target nucleic acids are DETECTED. The SARS-CoV-2 RNA is generally detectable in upper and lower  respiratory specimens dur ing the acute phase of infection.  Positive  results are indicative of active infection with SARS-CoV-2.  Clinical  correlation with patient history and other diagnostic information is  necessary to determine patient infection status.  Positive results do  not rule out bacterial infection or co-infection with other viruses. If result is PRESUMPTIVE POSTIVE SARS-CoV-2 nucleic acids MAY BE PRESENT.   A presumptive positive result was obtained on the submitted specimen  and confirmed on repeat testing.  While 2019 novel coronavirus  (SARS-CoV-2) nucleic acids may be present in the submitted sample  additional confirmatory testing may be necessary for epidemiological  and / or clinical management purposes  to differentiate between  SARS-CoV-2 and other Sarbecovirus currently known to infect humans.  If clinically indicated additional testing with an alternate test  methodology [phone]) is advised. The SARS-CoV-2 RNA is generally  detectable in upper and lower respiratory sp ecimens during the acute  phase of infection. The expected result is Negative. Fact Sheet for Patients:  StrictlyIdeas.no Fact Sheet for Healthcare Providers: BankingDealers.co.za This test is not yet approved or cleared by the  [place] FDA and has been authorized for detection and/or diagnosis of SARS-CoV-2 by FDA under an Emergency Use Authorization (EUA).  This EUA will remain in effect (meaning this test can be used) for the duration of the COVID-19 declaration under Section 564(b)(1) of the Act, 21 U.S.C. section 360bbb-3(b)(1), unless the authorization is terminated or revoked sooner. Performed at [facility], [address] [place], [place] [address]   MRSA PCR Screening     Status: None   Collection Time: 06/13/19  7:57 AM   Specimen: Nasal Mucosa; Nasopharyngeal  Result Value Ref Range Status   MRSA by PCR NEGATIVE NEGATIVE Final    Comment:        The GeneXpert MRSA Assay (FDA approved for NASAL specimens only), is one component of a comprehensive MRSA colonization surveillance program. It is not intended to diagnose MRSA infection nor to guide or monitor treatment for MRSA infections. Performed at [facility], [address] [place], [place] [address]           IMAGING    Dg Chest Portable 1 View  Result Date: 06/13/2019 CLINICAL DATA:  Shortness of breath EXAM: PORTABLE CHEST 1 VIEW COMPARISON:  05/23/2019 FINDINGS: Heart is borderline in size. Low lung volumes. Patchy bilateral airspace opacities. No effusions. No acute bony abnormality. IMPRESSION: Low lung volumes with patchy bilateral airspace opacities concerning for pneumonia. Electronically Signed   By: Rolm Baptise M.D.   On: 06/13/2019 03:06   CBC    Component Value Date/Time   WBC 9.8 06/13/2019 0242   RBC 3.38 (L) 06/13/2019 0242   HGB 10.6 (L) 06/13/2019 0242   HGB 12.9 (L) 11/21/2013 0432   HCT 33.7 (L) 06/13/2019 0242   HCT 40.3 11/21/2013 0432   PLT 209 06/13/2019 0242   PLT 246 11/25/2013 0457   MCV 99.7 06/13/2019 0242   MCV 87 11/21/2013 0432   MCH 31.4 06/13/2019 0242   MCHC 31.5 06/13/2019 0242   RDW 14.6 06/13/2019 0242   RDW 15.3 (H) 11/21/2013 0432   LYMPHSABS 0.7  06/13/2019 0242   LYMPHSABS 1.0 11/20/2013 0505   MONOABS 0.7 06/13/2019 0242   MONOABS 0.7 11/20/2013 0505   EOSABS 0.1 06/13/2019 0242   EOSABS 0.0 11/20/2013 0505   BASOSABS 0.1 06/13/2019 0242   BASOSABS 0.0 11/20/2013 0505   CMP Latest Ref Rng & Units 06/13/2019 05/24/2019 05/23/2019  Glucose 70 - 99 mg/dL 168(H) 85 122(H)  BUN 8 - 23 mg/dL 150(H) 63(H) 81(H)  Creatinine 0.61 - 1.24 mg/dL 20.99(H) 12.56(H) 13.67(H)  Sodium 135 - 145 mmol/L 142 140 138  Potassium 3.5 - 5.1 mmol/L 7.0(HH) 4.9 4.7  Chloride 98 - 111 mmol/L 101 99 97(L)  CO2 22 - 32 mmol/L 23 24 21(L)  Calcium 8.9 - 10.3 mg/dL 8.3(L) 8.3(L) 8.3(L)  Total Protein 6.5 - 8.1 g/dL 8.8(H) - -  Total Bilirubin 0.3 - 1.2 mg/dL 0.7 - -  Alkaline Phos 38 - 126 U/L 81 - -  AST 15 - 41 U/L 67(H) - -  ALT 0 - 44 U/L 85(H) - -      ASSESSMENT AND PLAN SYNOPSIS   Severe ACUTE Hypoxic and Hypercapnic Respiratory Failure d/t pulmonary edema  -Continue pt on BiPAP - wean as tolerated -Monitor SpO2 and RR  -elevated procalcitonin level (1.06), in absence of fever, elevated WBC and pt symptoms - cultures pending; monitor vitals, CBC and pt symptoms  -Continue IV lasix for pulmonary edema    ESRD -receiving hemodialysis in ICU - reassess pt condition and labs after -follow chem 7 -follow UO -Avoid nephrotoxic agents -Recheck BUN and Creatinine - BUN 150 and Creatinine 20.99 today  -hold ACEI due to impaired kidney function  -appreciate nephrology consult and recommendations    CARDIAC -Hypertension: Continue IV nitro. Monitor BP.  -Troponin elevated this AM at 33 - continue to trend  -Lovenox for anticoagulation  -ICU monitoring  HYPERKALEMIA -Pt given lasix, D50, insulin, albuterol, sodium bicarbonate and lokelma in ED  -Monitor potassium levels after hemodialysis  -Monitor EKG   GI GI PROPHYLAXIS as indicated   NUTRITIONAL STATUS DIET-->NPO  Constipation protocol as indicated   ENDO -TSH WNL - will  use ICU hypoglycemic\Hyperglycemia protocol if needed   ELECTROLYTES -follow labs as needed -replace as needed -pharmacy consultation and following   DVT/GI PRX ordered TRANSFUSIONS AS NEEDED MONITOR FSBS ASSESS the need for LABS    Critical care provider statement:  Critical care time (minutes):  109   Critical care time was exclusive of:  Separately billable procedures and  treating other patients   Critical care was necessary to treat or prevent imminent or  life-threatening deterioration of the following conditions:   Acute hypoxemic and hypercapnic respiratory failure, hyperkalemia, acute on chronic renal failure, anemia, multiple comorbid conditions   Critical care was time spent personally by me on the following  activities:  Development of treatment plan with patient or surrogate,  discussions with consultants, evaluation of patient's response to  treatment, examination of patient, obtaining history from patient or  surrogate, ordering and performing treatments and interventions, ordering  and review of laboratory studies and re-evaluation of patient's condition   I assumed direction of critical care for this patient from another  provider in my specialty: no      Ottie Glazier, M.D.  Pulmonary [facility] [place]

## 2019-06-14 ENCOUNTER — Inpatient Hospital Stay: Payer: BC Managed Care – PPO

## 2019-06-14 DIAGNOSIS — I10 Essential (primary) hypertension: Secondary | ICD-10-CM

## 2019-06-14 LAB — CBC WITH DIFFERENTIAL/PLATELET
Abs Immature Granulocytes: 0.03 10*3/uL (ref 0.00–0.07)
Basophils Absolute: 0 10*3/uL (ref 0.0–0.1)
Basophils Relative: 1 %
Eosinophils Absolute: 0.1 10*3/uL (ref 0.0–0.5)
Eosinophils Relative: 2 %
HCT: 26.7 % — ABNORMAL LOW (ref 39.0–52.0)
Hemoglobin: 8.8 g/dL — ABNORMAL LOW (ref 13.0–17.0)
Immature Granulocytes: 1 %
Lymphocytes Relative: 13 %
Lymphs Abs: 0.9 10*3/uL (ref 0.7–4.0)
MCH: 31.5 pg (ref 26.0–34.0)
MCHC: 33 g/dL (ref 30.0–36.0)
MCV: 95.7 fL (ref 80.0–100.0)
Monocytes Absolute: 0.8 10*3/uL (ref 0.1–1.0)
Monocytes Relative: 12 %
Neutro Abs: 4.7 10*3/uL (ref 1.7–7.7)
Neutrophils Relative %: 71 %
Platelets: 151 10*3/uL (ref 150–400)
RBC: 2.79 MIL/uL — ABNORMAL LOW (ref 4.22–5.81)
RDW: 13.5 % (ref 11.5–15.5)
WBC: 6.6 10*3/uL (ref 4.0–10.5)
nRBC: 0 % (ref 0.0–0.2)

## 2019-06-14 LAB — COMPREHENSIVE METABOLIC PANEL
ALT: 46 U/L — ABNORMAL HIGH (ref 0–44)
AST: 23 U/L (ref 15–41)
Albumin: 3.3 g/dL — ABNORMAL LOW (ref 3.5–5.0)
Alkaline Phosphatase: 65 U/L (ref 38–126)
Anion gap: 18 — ABNORMAL HIGH (ref 5–15)
BUN: 90 mg/dL — ABNORMAL HIGH (ref 8–23)
CO2: 29 mmol/L (ref 22–32)
Calcium: 8.4 mg/dL — ABNORMAL LOW (ref 8.9–10.3)
Chloride: 94 mmol/L — ABNORMAL LOW (ref 98–111)
Creatinine, Ser: 15.61 mg/dL — ABNORMAL HIGH (ref 0.61–1.24)
GFR calc Af Amer: 3 mL/min — ABNORMAL LOW (ref >60)
GFR calc non Af Amer: 3 mL/min — ABNORMAL LOW (ref >60)
Glucose, Bld: 66 mg/dL — ABNORMAL LOW (ref 70–99)
Potassium: 4.6 mmol/L (ref 3.5–5.1)
Sodium: 141 mmol/L (ref 135–145)
Total Bilirubin: 0.7 mg/dL (ref 0.3–1.2)
Total Protein: 7.3 g/dL (ref 6.5–8.1)

## 2019-06-14 LAB — TROPONIN I (HIGH SENSITIVITY)
Troponin I (High Sensitivity): 41 ng/L — ABNORMAL HIGH (ref <18)
Troponin I (High Sensitivity): 40 ng/L — ABNORMAL HIGH (ref <18)

## 2019-06-14 LAB — MAGNESIUM: Magnesium: 2.5 mg/dL — ABNORMAL HIGH (ref 1.7–2.4)

## 2019-06-14 LAB — GLUCOSE, CAPILLARY
Glucose-Capillary: 102 mg/dL — ABNORMAL HIGH (ref 70–99)
Glucose-Capillary: 91 mg/dL (ref 70–99)
Glucose-Capillary: 94 mg/dL (ref 70–99)

## 2019-06-14 LAB — PHOSPHORUS: Phosphorus: 5.7 mg/dL — ABNORMAL HIGH (ref 2.5–4.6)

## 2019-06-14 MED ORDER — INSULIN ASPART 100 UNIT/ML ~~LOC~~ SOLN: 0.0000 [IU] | Freq: Every day | SUBCUTANEOUS | Status: DC

## 2019-06-14 MED ORDER — LISINOPRIL 20 MG PO TABS
40.0000 mg | ORAL_TABLET | Freq: Every day | ORAL | Status: DC
  Administered 2019-06-14: 40 mg via ORAL
  Filled 2019-06-14: qty 2

## 2019-06-14 MED ORDER — EPOETIN ALFA 10000 UNIT/ML IJ SOLN
4000.0000 [IU] | INTRAMUSCULAR | Status: DC
  Filled 2019-06-14: qty 1

## 2019-06-14 MED ORDER — INSULIN DETEMIR 100 UNIT/ML ~~LOC~~ SOLN
30.0000 [IU] | Freq: Every day | SUBCUTANEOUS | Status: DC
  Administered 2019-06-14: 15 [IU] via SUBCUTANEOUS
  Filled 2019-06-14 (×2): qty 0.3

## 2019-06-14 MED ORDER — INSULIN ASPART 100 UNIT/ML ~~LOC~~ SOLN: 0.0000 [IU] | Freq: Three times a day (TID) | SUBCUTANEOUS | Status: DC

## 2019-06-14 NOTE — Progress Notes (Signed)
Inpatient Diabetes Program Recommendations  AACE/ADA: New Consensus Statement on Inpatient Glycemic Control  Target Ranges:  Prepandial:   less than 140 mg/dL      Peak postprandial:   less than 180 mg/dL (1-2 hours)      Critically ill patients:  140 - 180 mg/dL   Results for COBI, DELPH (MRN [id number]) as of 06/14/2019 10:08  Ref. Range 06/13/2019 02:42 06/14/2019 04:49  Glucose Latest Ref Range: 70 - 99 mg/dL 168 (H) 66 (L)   Review of Glycemic Control  Diabetes history: DM2 Outpatient Diabetes medications: Levemir 66 units QHS Current orders for Inpatient glycemic control: Levemir 35 units QHS  Inpatient Diabetes Program Recommendations:   Insulin-Basal: Please consider decreasing Levemir to 30 units QHS.  Insulin-Correction: Please consider ordering CBGs AC&HS and Novolog 0-9 units TID with meals and Novolog 0-5 units QHS.  Thanks, Barnie Alderman, RN, MSN, CDE Diabetes Coordinator Inpatient Diabetes Program [phone] (Team Pager from 8am to 5pm)

## 2019-06-14 NOTE — Progress Notes (Signed)
[facility]  ROUNDING NOTE   Subjective:  Patient completed dialysis yesterday. Resting comfortably today. Potassium down to 4.6.   Objective:  Vital signs in last 24 hours:  Temp:  [98.6 F (37 C)-100 F (37.8 C)] 100 F (37.8 C) (11/04 0800) Pulse Rate:  [77-93] 84 (11/04 0800) Resp:  [16-38] 18 (11/04 0800) BP: (128-212)/(54-134) 153/54 (11/04 0800) SpO2:  [85 %-100 %] 94 % (11/04 0800) Weight:  [118.1 kg-119.1 kg] 119.1 kg (11/04 0415)  Weight change: 0 kg Filed Weights   06/13/19 0228 06/13/19 1400 06/14/19 0415  Weight: 118.1 kg 118.1 kg 119.1 kg    Intake/Output: I/O last 3 completed shifts: In: 654.5 [P.O.:240; I.V.:372.7; IV Piggyback:41.8] Out: 2500 [Other:2500]   Intake/Output this shift:  Total I/O In: 19.7 [I.V.:19.7] Out: -   Physical Exam: General: No acute distress  Head: Normocephalic, atraumatic. Moist oral mucosal membranes  Eyes: Anicteric  Neck: Supple, trachea midline  Lungs:  Scattered rhonchi, normal effort  Heart: S1S2 no rubs  Abdomen:  Soft, nontender, bowel sounds present  Extremities: 1+ peripheral edema.  Neurologic: Lethargic but arousable  Skin: No lesions  Access: LUE AVF    Basic Metabolic Panel: Recent Labs  Lab 06/13/19 0242 06/13/19 1654 06/14/19 0449  NA 142  --  141  K 7.0* 3.7 4.6  CL 101  --  94*  CO2 23  --  29  GLUCOSE 168*  --  66*  BUN 150*  --  90*  CREATININE 20.99*  --  15.61*  CALCIUM 8.3*  --  8.4*  MG  --   --  2.5*  PHOS  --   --  5.7*    Liver Function Tests: Recent Labs  Lab 06/13/19 0242 06/14/19 0449  AST 67* 23  ALT 85* 46*  ALKPHOS 81 65  BILITOT 0.7 0.7  PROT 8.8* 7.3  ALBUMIN 4.3 3.3*   No results for input(s): LIPASE, AMYLASE in the last 168 hours. No results for input(s): AMMONIA in the last 168 hours.  CBC: Recent Labs  Lab 06/13/19 0242 06/14/19 0449  WBC 9.8 6.6  NEUTROABS 8.2* 4.7  HGB 10.6* 8.8*  HCT 33.7* 26.7*  MCV 99.7 95.7  PLT 209 151     Cardiac Enzymes: No results for input(s): CKTOTAL, CKMB, CKMBINDEX, TROPONINI in the last 168 hours.  BNP: Invalid input(s): POCBNP  CBG: Recent Labs  Lab 06/13/19 0754  GLUCAP 95    Microbiology: Results for orders placed or performed during the hospital encounter of 06/13/19  SARS Coronavirus 2 by RT PCR (hospital order, performed in [facility] hospital lab) Nasopharyngeal Nasopharyngeal Swab     Status: None   Collection Time: 06/13/19  2:42 AM   Specimen: Nasopharyngeal Swab  Result Value Ref Range Status   SARS Coronavirus 2 NEGATIVE NEGATIVE Final    Comment: (NOTE) If result is NEGATIVE SARS-CoV-2 target nucleic acids are NOT DETECTED. The SARS-CoV-2 RNA is generally detectable in upper and lower  respiratory specimens during the acute phase of infection. The lowest  concentration of SARS-CoV-2 viral copies this assay can detect is 250  copies / mL. A negative result does not preclude SARS-CoV-2 infection  and should not be used as the sole basis for treatment or other  patient management decisions.  A negative result may occur with  improper specimen collection / handling, submission of specimen other  than nasopharyngeal swab, presence of viral mutation(s) within the  areas targeted by this assay, and inadequate number of  viral copies  (<250 copies / mL). A negative result must be combined with clinical  observations, patient history, and epidemiological information. If result is POSITIVE SARS-CoV-2 target nucleic acids are DETECTED. The SARS-CoV-2 RNA is generally detectable in upper and lower  respiratory specimens dur ing the acute phase of infection.  Positive  results are indicative of active infection with SARS-CoV-2.  Clinical  correlation with patient history and other diagnostic information is  necessary to determine patient infection status.  Positive results do  not rule out bacterial infection or co-infection with other viruses. If result is  PRESUMPTIVE POSTIVE SARS-CoV-2 nucleic acids MAY BE PRESENT.   A presumptive positive result was obtained on the submitted specimen  and confirmed on repeat testing.  While 2019 novel coronavirus  (SARS-CoV-2) nucleic acids may be present in the submitted sample  additional confirmatory testing may be necessary for epidemiological  and / or clinical management purposes  to differentiate between  SARS-CoV-2 and other Sarbecovirus currently known to infect humans.  If clinically indicated additional testing with an alternate test  methodology [phone]) is advised. The SARS-CoV-2 RNA is generally  detectable in upper and lower respiratory sp ecimens during the acute  phase of infection. The expected result is Negative. Fact Sheet for Patients:  StrictlyIdeas.no Fact Sheet for Healthcare Providers: BankingDealers.co.za This test is not yet approved or cleared by the [place] FDA and has been authorized for detection and/or diagnosis of SARS-CoV-2 by FDA under an Emergency Use Authorization (EUA).  This EUA will remain in effect (meaning this test can be used) for the duration of the COVID-19 declaration under Section 564(b)(1) of the Act, 21 U.S.C. section 360bbb-3(b)(1), unless the authorization is terminated or revoked sooner. Performed at [facility], [address] [place], [place] [address]   MRSA PCR Screening     Status: None   Collection Time: 06/13/19  7:57 AM   Specimen: Nasal Mucosa; Nasopharyngeal  Result Value Ref Range Status   MRSA by PCR NEGATIVE NEGATIVE Final    Comment:        The GeneXpert MRSA Assay (FDA approved for NASAL specimens only), is one component of a comprehensive MRSA colonization surveillance program. It is not intended to diagnose MRSA infection nor to guide or monitor treatment for MRSA infections. Performed at [facility], [address] [place], [place] [address]    CULTURE, BLOOD (ROUTINE X 2) w Reflex to ID Panel     Status: None (Preliminary result)   Collection Time: 06/13/19  8:54 AM   Specimen: BLOOD  Result Value Ref Range Status   Specimen Description BLOOD RIGHT HAND  Final   Special Requests   Final    BOTTLES DRAWN AEROBIC AND ANAEROBIC Blood Culture adequate volume   Culture   Final    NO GROWTH < 24 HOURS Performed at [facility], [address] [place], [place] [address]    Report Status PENDING  Incomplete  CULTURE, BLOOD (ROUTINE X 2) w Reflex to ID Panel     Status: None (Preliminary result)   Collection Time: 06/13/19  8:59 AM   Specimen: BLOOD  Result Value Ref Range Status   Specimen Description BLOOD RIGHT HAND  Final   Special Requests   Final    BOTTLES DRAWN AEROBIC AND ANAEROBIC Blood Culture adequate volume   Culture   Final    NO GROWTH < 24 HOURS Performed at [facility], [address], [place],  [address]  Report Status PENDING  Incomplete    Coagulation Studies: No results for input(s): LABPROT, INR in the last 72 hours.  Urinalysis: No results for input(s): COLORURINE, LABSPEC, PHURINE, GLUCOSEU, HGBUR, BILIRUBINUR, KETONESUR, PROTEINUR, UROBILINOGEN, NITRITE, LEUKOCYTESUR in the last 72 hours.  Invalid input(s): APPERANCEUR    Imaging: Dg Chest Port 1 View  Result Date: 06/14/2019 CLINICAL DATA:  Pulmonary infiltrates, status post dialysis EXAM: PORTABLE CHEST 1 VIEW COMPARISON:  06/13/2019 FINDINGS: No significant interval change in diffuse bilateral interstitial pulmonary opacity. No new or focal airspace opacity. Cardiomegaly. IMPRESSION: 1. No significant interval change in diffuse bilateral interstitial pulmonary opacity, consistent with edema. No new or focal airspace opacity. 2.  Cardiomegaly. Electronically Signed   By: Eddie Candle M.D.   On: 06/14/2019 09:30   Dg Chest Portable 1 View  Result Date: 06/13/2019 CLINICAL DATA:  Shortness of breath EXAM: PORTABLE  CHEST 1 VIEW COMPARISON:  05/23/2019 FINDINGS: Heart is borderline in size. Low lung volumes. Patchy bilateral airspace opacities. No effusions. No acute bony abnormality. IMPRESSION: Low lung volumes with patchy bilateral airspace opacities concerning for pneumonia. Electronically Signed   By: Rolm Baptise M.D.   On: 06/13/2019 03:06     Medications:   . sodium chloride    . sodium chloride    . sodium chloride    . nitroGLYCERIN 40 mcg/min (06/14/19 0725)   . albuterol  2.5 mg Nebulization Once  . amLODipine  10 mg Oral Daily  . aspirin EC  81 mg Oral Daily  . calcium acetate  1,334 mg Oral TID WC  . calcium-vitamin D  2 tablet Oral QHS  . Chlorhexidine Gluconate Cloth  6 each Topical Q0600  . ferric citrate  210 mg Oral TID WC  . furosemide  60 mg Intravenous BID  . guaiFENesin  600 mg Oral BID  . heparin  5,000 Units Subcutaneous Q8H  . insulin detemir  35 Units Subcutaneous QHS  . lanthanum  3,000 mg Oral TID WC  . metoprolol succinate  50 mg Oral Daily  . multivitamin  1 tablet Oral Once per day on Mon Wed Fri  . rOPINIRole  1 mg Oral TID  . sodium chloride flush  3 mL Intravenous Q12H   sodium chloride, sodium chloride, sodium chloride, acetaminophen, alteplase, heparin, ipratropium-albuterol, lidocaine (PF), lidocaine-prilocaine, ondansetron (ZOFRAN) IV, pentafluoroprop-tetrafluoroeth, sodium chloride flush, zolpidem  Assessment/ Plan:  63 y.o. male with past medical history of ESRD on HD TTS, diabetes mellitus type 2, hypertension, anemia chronic kidney disease, secondary hyperparathyroidism who was admitted shortness of breath placed on BiPAP after having his dialysis on Saturday.  [facility]/[address]/TTHS/EDW 114  1.  ESRD on HD TTS/hyperkalemia.  Patient completed dialysis yesterday and tolerated well.  Potassium down to 4.6 today.  No urgent indication for dialysis today.  We will plan for hemodialysis again tomorrow..  2.  Acute respiratory failure.  Currently  transitioned off of BiPAP.  Continue to monitor respiratory status.  3.  Secondary hyperparathyroidism.  Patient on calcium acetate, Fosrenol, and Auryxia.  Serum phosphorus currently 5.7.  Continue to monitor.  4.  Anemia chronic kidney disease.  Hemoglobin down to 8.8.  Start the patient back on Epogen 4000 units IV with dialysis tomorrow.   LOS: 1 [place]ia Dohner 11/4/20209:48 AM

## 2019-06-14 NOTE — Consult Note (Addendum)
Name: Donald Silva MRN: [id number] DOB: 11/15/1955     CONSULTATION DATE: 06/13/2019  REFERRING MD :  Dr. Alfred Levins, MD - [facility] Emergency Department   CHIEF COMPLAINT: Acute hypoxic respiratory failure, Anasarca, severe electrolyte derrangement, pulmonary edema, accelerated hypertension   HISTORY OF PRESENT ILLNESS:    Donald Silva is a 46 M w/PMH as below, missed HD,came in w/anasarca seen in ER hypoxemic 82% SpO2, accelerated HTN SBP>200, severe electrolyte derrangements s/p emergent HD via AVF on LUE, NIV respiratory support in MICU for medical mgt.     PAST MEDICAL HISTORY :   has a past medical history of CKD (chronic kidney disease) stage 5, GFR less than 15 ml/min (HCC), Diabetes mellitus without complication ([place]), Dyspnea, GERD (gastroesophageal reflux disease), Hypertension, Lower extremity edema, and Myocardial infarction ([place]).  has a past surgical history that includes Cholecystectomy; Toe Surgery (Right); AV fistula placement (Left, 08/27/2017); and A/V Fistulagram (N/A, 05/03/2018).   Prior to Admission medications   Medication Sig Start Date End Date Taking? Authorizing Provider  albuterol (PROVENTIL HFA;VENTOLIN HFA) 108 (90 Base) MCG/ACT inhaler Inhale 1-2 puffs into the lungs every 6 (six) hours as needed for wheezing or shortness of breath.  08/17/18 08/17/19 Yes [provider]  amLODipine (NORVASC) 10 MG tablet Take 10 mg by mouth daily.  09/02/18  Yes [provider]  aspirin EC 81 MG tablet Take 81 mg daily by mouth.   Yes [provider]  AURYXIA 1 GM 210 MG(Fe) tablet Take 210 mg by mouth 3 (three) times daily with meals.  05/20/18  Yes [provider]  b complex-vitamin c-folic acid (NEPHRO-VITE) 0.8 MG TABS tablet Take 1 tablet by mouth every Monday, Wednesday, and Friday. After dialysis treatment 03/11/18  Yes [provider]  calcium acetate (PHOSLO) 667 MG capsule Take 1,334 mg by mouth 3 (three) times daily with meals.   01/28/18  Yes [provider]  calcium carbonate (TUMS EX) 750 MG chewable tablet Chew 2 tablets by mouth at bedtime.   Yes [provider]  gabapentin (NEURONTIN) 300 MG capsule Take 300 mg by mouth 3 (three) times daily.  07/22/18  Yes [provider]  Insulin Detemir (LEVEMIR FLEXTOUCH) 100 UNIT/ML Pen Inject 66 Units into the skin at bedtime.  06/01/16  Yes [provider]  lanthanum (FOSRENOL) 1000 MG chewable tablet Chew 3,000 mg by mouth 3 (three) times daily with meals.  05/30/18  Yes [provider]  lisinopril (ZESTRIL) 40 MG tablet Take 40 mg by mouth daily.  02/05/18  Yes [provider]  metoprolol succinate (TOPROL-XL) 50 MG 24 hr tablet Take 50 mg by mouth daily.  09/12/18  Yes [provider]  rOPINIRole (REQUIP) 1 MG tablet Take 1 mg by mouth 3 (three) times daily.  03/23/19  Yes [provider]  acetaminophen (MAPAP) 325 MG tablet Take 325 mg by mouth every 4 (four) hours as needed for mild pain.  01/31/19 01/30/20  [provider]  clindamycin (CLEOCIN) 300 MG capsule Take 1 capsule (300 mg total) by mouth 3 (three) times daily. Patient not taking: Reported on 06/13/2019 05/05/19   Edrick Kins, DPM  gentamicin cream (GARAMYCIN) 0.1 % Apply 1 application topically 2 (two) times daily. Patient not taking: Reported on 05/22/2019 10/18/18   Edrick Kins, DPM  heparin 1000 UNIT/ML injection Inject 8,000 Units into the vein every dialysis.  01/17/19 01/16/20  [provider]   Allergies  Allergen Reactions  . Penicillins Anaphylaxis  Was told it could kill him, pt unsure of reaction  Has patient had a PCN reaction causing immediate rash, facial/tongue/throat swelling, SOB or lightheadedness with hypotension: No Has patient had a PCN reaction causing severe rash involving mucus membranes or skin necrosis: No Has patient had a PCN reaction that required hospitalization: No Has patient had a PCN  reaction occurring within the last 10 years: No If all of the above answers are "NO", then may proceed with Cephalosporin use.   . Cyclopentolate Other (See Comments)    FAMILY HISTORY:  family history includes Diabetes in his father and sister; Hypertension in his sister. SOCIAL HISTORY:  reports that he has never smoked. He has never used smokeless tobacco. He reports that he does not drink alcohol or use drugs.  REVIEW OF SYSTEMS:    10 point ROS conducted and is negative except as per subjective findings   Review of Systems  Constitutional: Negative for chills and fever.       Pt reports he feels better since being admitted  Eyes: Negative for blurred vision and double vision.  Respiratory: Negative for cough and shortness of breath.   Cardiovascular: Negative for chest pain and palpitations.  Gastrointestinal: Negative for abdominal pain, constipation, diarrhea, nausea and vomiting.  Genitourinary: Negative for dysuria.  Neurological: Negative for dizziness, tingling, sensory change, weakness and headaches.      VITAL SIGNS: Temp:  [98.6 F (37 C)-100 F (37.8 C)] 100 F (37.8 C) (11/04 0800) Pulse Rate:  [77-93] 84 (11/04 0800) Resp:  [14-38] 18 (11/04 0800) BP: (128-212)/(54-134) 153/54 (11/04 0800) SpO2:  [85 %-100 %] 94 % (11/04 0800) Weight:  [118.1 kg-119.1 kg] 119.1 kg (11/04 0415)   I/O last 3 completed shifts: In: 654.5 [P.O.:240; I.V.:372.7; IV Piggyback:41.8] Out: 2500 [Other:2500] Total I/O In: 19.7 [I.V.:19.7] Out: -    SpO2: 94 % O2 Flow Rate (L/min): 2 L/min FiO2 (%): 40 %    Physical Examination:   General appearance: No apparent distress ENT: Neck supple Moist oral mucosa Respiratory: Chest auscultation with bilateral crackles worse at the bases without wheezing Gastrointestinal: Obese abdomen positive bowel sounds x4 Musculoskeletal: Lower extremity edema pitting 2+, left upper extremity AV fistula with bruit Neurological: Grossly  no FND    MEDICATIONS: I have reviewed all medications and confirmed regimen as documented   CULTURE RESULTS   Recent Results (from the past 240 hour(s))  SARS Coronavirus 2 by RT PCR (hospital order, performed in [facility] hospital lab) Nasopharyngeal Nasopharyngeal Swab     Status: None   Collection Time: 06/13/19  2:42 AM   Specimen: Nasopharyngeal Swab  Result Value Ref Range Status   SARS Coronavirus 2 NEGATIVE NEGATIVE Final    Comment: (NOTE) If result is NEGATIVE SARS-CoV-2 target nucleic acids are NOT DETECTED. The SARS-CoV-2 RNA is generally detectable in upper and lower  respiratory specimens during the acute phase of infection. The lowest  concentration of SARS-CoV-2 viral copies this assay can detect is 250  copies / mL. A negative result does not preclude SARS-CoV-2 infection  and should not be used as the sole basis for treatment or other  patient management decisions.  A negative result may occur with  improper specimen collection / handling, submission of specimen other  than nasopharyngeal swab, presence of viral mutation(s) within the  areas targeted by this assay, and inadequate number of viral copies  (<250 copies / mL). A negative result must be combined with clinical  observations, patient history, and epidemiological  information. If result is POSITIVE SARS-CoV-2 target nucleic acids are DETECTED. The SARS-CoV-2 RNA is generally detectable in upper and lower  respiratory specimens dur ing the acute phase of infection.  Positive  results are indicative of active infection with SARS-CoV-2.  Clinical  correlation with patient history and other diagnostic information is  necessary to determine patient infection status.  Positive results do  not rule out bacterial infection or co-infection with other viruses. If result is PRESUMPTIVE POSTIVE SARS-CoV-2 nucleic acids MAY BE PRESENT.   A presumptive positive result was obtained on the submitted specimen  and  confirmed on repeat testing.  While 2019 novel coronavirus  (SARS-CoV-2) nucleic acids may be present in the submitted sample  additional confirmatory testing may be necessary for epidemiological  and / or clinical management purposes  to differentiate between  SARS-CoV-2 and other Sarbecovirus currently known to infect humans.  If clinically indicated additional testing with an alternate test  methodology [phone]) is advised. The SARS-CoV-2 RNA is generally  detectable in upper and lower respiratory sp ecimens during the acute  phase of infection. The expected result is Negative. Fact Sheet for Patients:  StrictlyIdeas.no Fact Sheet for Healthcare Providers: BankingDealers.co.za This test is not yet approved or cleared by the [place] FDA and has been authorized for detection and/or diagnosis of SARS-CoV-2 by FDA under an Emergency Use Authorization (EUA).  This EUA will remain in effect (meaning this test can be used) for the duration of the COVID-19 declaration under Section 564(b)(1) of the Act, 21 U.S.C. section 360bbb-3(b)(1), unless the authorization is terminated or revoked sooner. Performed at [facility], [address] [place], [place] [address]   MRSA PCR Screening     Status: None   Collection Time: 06/13/19  7:57 AM   Specimen: Nasal Mucosa; Nasopharyngeal  Result Value Ref Range Status   MRSA by PCR NEGATIVE NEGATIVE Final    Comment:        The GeneXpert MRSA Assay (FDA approved for NASAL specimens only), is one component of a comprehensive MRSA colonization surveillance program. It is not intended to diagnose MRSA infection nor to guide or monitor treatment for MRSA infections. Performed at [facility], [address] [place], [place] [address]   CULTURE, BLOOD (ROUTINE X 2) w Reflex to ID Panel     Status: None (Preliminary result)   Collection Time: 06/13/19  8:54 AM   Specimen:  BLOOD  Result Value Ref Range Status   Specimen Description BLOOD RIGHT HAND  Final   Special Requests   Final    BOTTLES DRAWN AEROBIC AND ANAEROBIC Blood Culture adequate volume   Culture   Final    NO GROWTH < 24 HOURS Performed at [facility], [address] [place], [place] [address]    Report Status PENDING  Incomplete  CULTURE, BLOOD (ROUTINE X 2) w Reflex to ID Panel     Status: None (Preliminary result)   Collection Time: 06/13/19  8:59 AM   Specimen: BLOOD  Result Value Ref Range Status   Specimen Description BLOOD RIGHT HAND  Final   Special Requests   Final    BOTTLES DRAWN AEROBIC AND ANAEROBIC Blood Culture adequate volume   Culture   Final    NO GROWTH < 24 HOURS Performed at [facility], [address] [place],  [address]    Report Status PENDING  Incomplete          IMAGING  INTERVAL IMPROVEMENT IN INFILTRATES POST DIALYSIS SPECIFICALLY AT BASES BILATERALLY     No results found. CBC    Component Value Date/Time   WBC 6.6 06/14/2019 0449   RBC 2.79 (L) 06/14/2019 0449   HGB 8.8 (L) 06/14/2019 0449   HGB 12.9 (L) 11/21/2013 0432   HCT 26.7 (L) 06/14/2019 0449   HCT 40.3 11/21/2013 0432   PLT 151 06/14/2019 0449   PLT 246 11/25/2013 0457   MCV 95.7 06/14/2019 0449   MCV 87 11/21/2013 0432   MCH 31.5 06/14/2019 0449   MCHC 33.0 06/14/2019 0449   RDW 13.5 06/14/2019 0449   RDW 15.3 (H) 11/21/2013 0432   LYMPHSABS 0.9 06/14/2019 0449   LYMPHSABS 1.0 11/20/2013 0505   MONOABS 0.8 06/14/2019 0449   MONOABS 0.7 11/20/2013 0505   EOSABS 0.1 06/14/2019 0449   EOSABS 0.0 11/20/2013 0505   BASOSABS 0.0 06/14/2019 0449   BASOSABS 0.0 11/20/2013 0505   CMP Latest Ref Rng & Units 06/14/2019 06/13/2019 06/13/2019  Glucose 70 - 99 mg/dL 66(L) - 168(H)  BUN 8 - 23 mg/dL 90(H) - 150(H)  Creatinine 0.61 - 1.24 mg/dL 15.61(H) - 20.99(H)  Sodium 135 - 145 mmol/L 141 - 142  Potassium 3.5 - 5.1 mmol/L 4.6 3.7 7.0(HH)   Chloride 98 - 111 mmol/L 94(L) - 101  CO2 22 - 32 mmol/L 29 - 23  Calcium 8.9 - 10.3 mg/dL 8.4(L) - 8.3(L)  Total Protein 6.5 - 8.1 g/dL 7.3 - 8.8(H)  Total Bilirubin 0.3 - 1.2 mg/dL 0.7 - 0.7  Alkaline Phos 38 - 126 U/L 65 - 81  AST 15 - 41 U/L 23 - 67(H)  ALT 0 - 44 U/L 46(H) - 85(H)    Multidisciplinary rounds held today            ASSESSMENT AND PLAN    Acute Hypoxemic and Respiratory Failure d/t pulmonary edema  -Continue pt on BiPAP - wean as tolerated -Monitor SpO2 and RR  -elevated procalcitonin level (1.06), in absence of fever and absence of leukocytosis -improved post HD -2500cc - repeat CXR this am     ESRD -s/p HD -clinically improved -follow chem 7 -appreciate nephrology consult and recommendations  - caution with renally cleared Rx    CARDIAC -Hypertension: Continue IV nitro. Monitor BP.  -Troponin elevated this AM at 33 - continue to trend  -Lovenox for anticoagulation  -ICU monitoring- abnormal EKG -subacute -similar to yesterday poss AFL - will ask cardiology non urgent evaluation    Severe electrolyte derrangement in context of ESRD -Pt given lasix, D50, insulin, albuterol, sodium bicarbonate and lokelma in ED  -Monitor potassium,phos, mag, iCAL levels after hemodialysis  -Monitor EKG    GI GI PROPHYLAXIS as indicated   NUTRITIONAL STATUS DIET-->NPO  Constipation protocol as indicated   ENDO -TSH WNL - will use ICU hypoglycemic\Hyperglycemia protocol if needed   ELECTROLYTES -follow labs as needed -replace as needed -pharmacy consultation and following   DVT/GI PRX ordered TRANSFUSIONS AS NEEDED MONITOR FSBS ASSESS the need for LABS    Critical care provider statement:    Critical care time (minutes):  33   Critical care time was exclusive of:  Separately billable procedures and  treating other patients   Critical care was necessary to treat or prevent imminent or  life-threatening deterioration of the following  conditions:   Acute hypoxemic and hypercapnic respiratory failure, hyperkalemia, acute on chronic renal failure, anemia, multiple comorbid conditions   Critical care was time spent personally by  me on the following  activities:  Development of treatment plan with patient or surrogate,  discussions with consultants, evaluation of patient's response to  treatment, examination of patient, obtaining history from patient or  surrogate, ordering and performing treatments and interventions, ordering  and review of laboratory studies and re-evaluation of patient's condition   I assumed direction of critical care for this patient from another  provider in my specialty: no      Ottie Glazier, M.D.  P[facility] [place]

## 2019-06-14 NOTE — Progress Notes (Signed)
Ch visited pt during rounds. Pt was a/o but also presented to have labored breathing. Pt shared that he was "ready to go home!". Ch informed pt that he may need more time and also needed to have his dialysis trt. Pt understood. Pt had wife at bedside for support. Ch provided words of encouragement and social support.   06/14/19 1100  Clinical Encounter Type  Visited With Patient and family together  Visit Type Psychological support;Social support  Spiritual Encounters  Spiritual Needs Emotional;Grief support  Stress Factors  Patient Stress Factors Exhausted;Health changes;Loss of control

## 2019-06-14 NOTE — Progress Notes (Signed)
Progress Note  Patient Name: Donald Silva Date of Encounter: 06/14/2019  Primary Cardiologist:New- Dr. Saunders Revel  Subjective   States feeling better after hemodialysis yesterday.  Denies shortness of breath.  Inpatient Medications    Scheduled Meds: . amLODipine  10 mg Oral Daily  . aspirin EC  81 mg Oral Daily  . calcium acetate  1,334 mg Oral TID WC  . calcium-vitamin D  2 tablet Oral QHS  . Chlorhexidine Gluconate Cloth  6 each Topical Q0600  . [START ON 06/15/2019] epoetin (EPOGEN/PROCRIT) injection  4,000 Units Intravenous Q T,Th,Sa-HD  . ferric citrate  210 mg Oral TID WC  . furosemide  60 mg Intravenous BID  . guaiFENesin  600 mg Oral BID  . heparin  5,000 Units Subcutaneous Q8H  . insulin aspart  0-5 Units Subcutaneous QHS  . insulin aspart  0-9 Units Subcutaneous TID WC  . insulin detemir  30 Units Subcutaneous QHS  . lanthanum  3,000 mg Oral TID WC  . metoprolol succinate  50 mg Oral Daily  . multivitamin  1 tablet Oral Once per day on Mon Wed Fri  . rOPINIRole  1 mg Oral TID  . sodium chloride flush  3 mL Intravenous Q12H   Continuous Infusions: . sodium chloride    . sodium chloride    . sodium chloride    . nitroGLYCERIN 40 mcg/min (06/14/19 1138)   PRN Meds: sodium chloride, sodium chloride, sodium chloride, acetaminophen, alteplase, heparin, ipratropium-albuterol, lidocaine (PF), lidocaine-prilocaine, ondansetron (ZOFRAN) IV, pentafluoroprop-tetrafluoroeth, sodium chloride flush, zolpidem   Vital Signs    Vitals:   06/14/19 0800 06/14/19 0900 06/14/19 1000 06/14/19 1100  BP: (!) 153/54 (!) 154/82 (!) 146/74   Pulse: 84 88 82 84  Resp: 18 15 (!) 22 (!) 30  Temp: 100 F (37.8 C)     TempSrc: Oral     SpO2: 94% 92% 95% 95%  Weight:      Height:        Intake/Output Summary (Last 24 hours) at 06/14/2019 1213 Last data filed at 06/14/2019 1138 Gross per 24 hour  Intake 584.38 ml  Output 2500 ml  Net -1915.62 ml   Last 3 Weights 06/14/2019 06/13/2019  06/13/2019  Weight (lbs) 262 lb 9.1 oz 260 lb 5.8 oz 260 lb 5.8 oz  Weight (kg) 119.1 kg 118.1 kg 118.1 kg      Telemetry    Sinus rhythm- Personally Reviewed  ECG    Obtained today  Physical Exam   GEN: No acute distress.   Neck: No JVD Cardiac: RRR, no murmurs, rubs, or gallops.  Respiratory:  Poor inspiratory effort, decreased breath sounds at bases. GI: Soft, nontender, non-distended  MS: No edema; No deformity. Neuro:  Nonfocal  Psych: Normal affect   Labs    High Sensitivity Troponin:   Recent Labs  Lab 05/23/19 1226 05/23/19 1338 06/13/19 0242 06/14/19 0818 06/14/19 1012  TROPONINIHS 85* 88* 33* 41* 40*      Chemistry Recent Labs  Lab 06/13/19 0242 06/13/19 1654 06/14/19 0449  NA 142  --  141  K 7.0* 3.7 4.6  CL 101  --  94*  CO2 23  --  29  GLUCOSE 168*  --  66*  BUN 150*  --  90*  CREATININE 20.99*  --  15.61*  CALCIUM 8.3*  --  8.4*  PROT 8.8*  --  7.3  ALBUMIN 4.3  --  3.3*  AST 67*  --  23  ALT 85*  --  46*  ALKPHOS 81  --  65  BILITOT 0.7  --  0.7  GFRNONAA 2*  --  3*  GFRAA 2*  --  3*  ANIONGAP 18*  --  18*     Hematology Recent Labs  Lab 06/13/19 0242 06/14/19 0449  WBC 9.8 6.6  RBC 3.38* 2.79*  HGB 10.6* 8.8*  HCT 33.7* 26.7*  MCV 99.7 95.7  MCH 31.4 31.5  MCHC 31.5 33.0  RDW 14.6 13.5  PLT 209 151    BNPNo results for input(s): BNP, PROBNP in the last 168 hours.   DDimer No results for input(s): DDIMER in the last 168 hours.   Radiology    Dg Chest Port 1 View  Result Date: 06/14/2019 CLINICAL DATA:  Pulmonary infiltrates, status post dialysis EXAM: PORTABLE CHEST 1 VIEW COMPARISON:  06/13/2019 FINDINGS: No significant interval change in diffuse bilateral interstitial pulmonary opacity. No new or focal airspace opacity. Cardiomegaly. IMPRESSION: 1. No significant interval change in diffuse bilateral interstitial pulmonary opacity, consistent with edema. No new or focal airspace opacity. 2.  Cardiomegaly.  Electronically Signed   By: Eddie Candle M.D.   On: 06/14/2019 09:30   Dg Chest Portable 1 View  Result Date: 06/13/2019 CLINICAL DATA:  Shortness of breath EXAM: PORTABLE CHEST 1 VIEW COMPARISON:  05/23/2019 FINDINGS: Heart is borderline in size. Low lung volumes. Patchy bilateral airspace opacities. No effusions. No acute bony abnormality. IMPRESSION: Low lung volumes with patchy bilateral airspace opacities concerning for pneumonia. Electronically Signed   By: Rolm Baptise M.D.   On: 06/13/2019 03:06    Cardiac Studies   TTE10/13/2020 1. Left ventricular ejection fraction, by visual estimation, is 60 to 65%. The left ventricle has normal function. Normal left ventricular size. There is moderately increased left ventricular hypertrophy. 2. Definity contrast agent was given IV to delineate the left ventricular endocardial borders. 3. Left ventricular diastolic Doppler parameters are consistent with impaired relaxation pattern of LV diastolic filling. 4. Global right ventricle has normal systolic function.The right ventricular size is normal. Mildly increased right ventricular wall thickness. 5. Left atrial size was normal. 6. Right atrial size was mildly dilated. 7. The mitral valve is degenerative. Trace mitral valve regurgitation. 8. The tricuspid valve is not well visualized. Tricuspid valve regurgitation is trivial. 9. The aortic valve is tricuspid Aortic valve regurgitation was not visualized by color flow Doppler. Mild to moderate aortic valve sclerosis/calcification without any evidence of aortic stenosis. 10. The pulmonic valve was not well visualized. Pulmonic valve regurgitation is not visualized by color flow Doppler. 11. TR signal is inadequate for assessing pulmonary artery systolic pressure. 12. The inferior vena cava is normal in size with <50% respiratory variability, suggesting right atrial pressure of 8 mmHg. 13. The interatrial septum was not well visualized.   Patient Profile     63 y.o. male with history of end-stage renal disease on hemodialysis, hypertension, diabetes who presents to the hospital due to shortness of breath.  He was found to be hyperkalemic and have pulmonary edema.  He missed his dialysis session 3 days prior to his admission.  He was placed on BiPAP with improvement in his shortness of breath and had dialysis yesterday with resolution of symptoms.  He is currently off BiPAP breathing comfortably in bed  Assessment & Plan    Shortness of breath and pulmonary edema secondary to fluid overload as a result of missed dialysis session.  His hyperkalemia has resolved with dialysis.  Pulmonary edema Dialysis session adherence advised.  Hypertension Continue home regimen of amlodipine, metoprolol succinate. Restart lisinopril if okay with nephrology thinks potassium is now normal.  No further cardiac testing or interventions planned during this admission.  Please let us know if further input is needed.    Signed, Kate Sable, MD  06/14/2019, 12:13 PM

## 2019-06-15 LAB — CBC WITH DIFFERENTIAL/PLATELET
Abs Immature Granulocytes: 0.03 10*3/uL (ref 0.00–0.07)
Basophils Absolute: 0 10*3/uL (ref 0.0–0.1)
Basophils Relative: 0 %
Eosinophils Absolute: 0.5 10*3/uL (ref 0.0–0.5)
Eosinophils Relative: 6 %
HCT: 28.9 % — ABNORMAL LOW (ref 39.0–52.0)
Hemoglobin: 9.5 g/dL — ABNORMAL LOW (ref 13.0–17.0)
Immature Granulocytes: 0 %
Lymphocytes Relative: 23 %
Lymphs Abs: 1.9 10*3/uL (ref 0.7–4.0)
MCH: 31.3 pg (ref 26.0–34.0)
MCHC: 32.9 g/dL (ref 30.0–36.0)
MCV: 95.1 fL (ref 80.0–100.0)
Monocytes Absolute: 1.1 10*3/uL — ABNORMAL HIGH (ref 0.1–1.0)
Monocytes Relative: 13 %
Neutro Abs: 4.9 10*3/uL (ref 1.7–7.7)
Neutrophils Relative %: 58 %
Platelets: 165 10*3/uL (ref 150–400)
RBC: 3.04 MIL/uL — ABNORMAL LOW (ref 4.22–5.81)
RDW: 13.3 % (ref 11.5–15.5)
WBC: 8.5 10*3/uL (ref 4.0–10.5)
nRBC: 0 % (ref 0.0–0.2)

## 2019-06-15 LAB — BASIC METABOLIC PANEL
Anion gap: 18 — ABNORMAL HIGH (ref 5–15)
BUN: 102 mg/dL — ABNORMAL HIGH (ref 8–23)
CO2: 28 mmol/L (ref 22–32)
Calcium: 8.6 mg/dL — ABNORMAL LOW (ref 8.9–10.3)
Chloride: 92 mmol/L — ABNORMAL LOW (ref 98–111)
Creatinine, Ser: 17.49 mg/dL — ABNORMAL HIGH (ref 0.61–1.24)
GFR calc Af Amer: 3 mL/min — ABNORMAL LOW (ref >60)
GFR calc non Af Amer: 2 mL/min — ABNORMAL LOW (ref >60)
Glucose, Bld: 58 mg/dL — ABNORMAL LOW (ref 70–99)
Potassium: 4.9 mmol/L (ref 3.5–5.1)
Sodium: 138 mmol/L (ref 135–145)

## 2019-06-15 LAB — GLUCOSE, CAPILLARY: Glucose-Capillary: 80 mg/dL (ref 70–99)

## 2019-06-15 LAB — PHOSPHORUS: Phosphorus: 5.5 mg/dL — ABNORMAL HIGH (ref 2.5–4.6)

## 2019-06-15 MED ORDER — EPOETIN ALFA 4000 UNIT/ML IJ SOLN
4000.0000 [IU] | INTRAMUSCULAR | Status: DC
  Filled 2019-06-15: qty 1

## 2019-06-15 NOTE — Progress Notes (Signed)
[facility]  ROUNDING NOTE   Subjective:  Patient seen and evaluated during dialysis treatment. Tolerating well. Potassium currently 4.9.   Objective:  Vital signs in last 24 hours:  Temp:  [98 F (36.7 C)-99.1 F (37.3 C)] 98.5 F (36.9 C) (11/05 0920) Pulse Rate:  [68-84] 70 (11/05 0930) Resp:  [15-30] 18 (11/05 0930) BP: (129-225)/(58-180) 167/79 (11/05 0930) SpO2:  [91 %-100 %] 96 % (11/05 0748) Weight:  [121 kg-121.6 kg] 121 kg (11/05 0920)  Weight change: 3.5 kg Filed Weights   06/14/19 0415 06/15/19 0500 06/15/19 0920  Weight: 119.1 kg 121.6 kg 121 kg    Intake/Output: I/O last 3 completed shifts: In: 242.6 [I.V.:242.6] Out: 0    Intake/Output this shift:  No intake/output data recorded.  Physical Exam: General: No acute distress  Head: Normocephalic, atraumatic. Moist oral mucosal membranes  Eyes: Anicteric  Neck: Supple, trachea midline  Lungs:  Scattered rhonchi, normal effort  Heart: S1S2 no rubs  Abdomen:  Soft, nontender, bowel sounds present  Extremities: 1+ peripheral edema.  Neurologic: Lethargic but arousable  Skin: No lesions  Access: LUE AVF    Basic Metabolic Panel: Recent Labs  Lab 06/13/19 0242 06/13/19 1654 06/14/19 0449 06/15/19 0445  NA 142  --  141 138  K 7.0* 3.7 4.6 4.9  CL 101  --  94* 92*  CO2 23  --  29 28  GLUCOSE 168*  --  66* 58*  BUN 150*  --  90* 102*  CREATININE 20.99*  --  15.61* 17.49*  CALCIUM 8.3*  --  8.4* 8.6*  MG  --   --  2.5*  --   PHOS  --   --  5.7*  --     Liver Function Tests: Recent Labs  Lab 06/13/19 0242 06/14/19 0449  AST 67* 23  ALT 85* 46*  ALKPHOS 81 65  BILITOT 0.7 0.7  PROT 8.8* 7.3  ALBUMIN 4.3 3.3*   No results for input(s): LIPASE, AMYLASE in the last 168 hours. No results for input(s): AMMONIA in the last 168 hours.  CBC: Recent Labs  Lab 06/13/19 0242 06/14/19 0449 06/15/19 0445  WBC 9.8 6.6 8.5  NEUTROABS 8.2* 4.7 4.9  HGB 10.6* 8.8* 9.5*  HCT  33.7* 26.7* 28.9*  MCV 99.7 95.7 95.1  PLT 209 151 165    Cardiac Enzymes: No results for input(s): CKTOTAL, CKMB, CKMBINDEX, TROPONINI in the last 168 hours.  BNP: Invalid input(s): POCBNP  CBG: Recent Labs  Lab 06/13/19 0754 06/14/19 1154 06/14/19 1730 06/14/19 2118 06/15/19 0723  GLUCAP 95 102* 91 94 80    Microbiology: Results for orders placed or performed during the hospital encounter of 06/13/19  SARS Coronavirus 2 by RT PCR (hospital order, performed in [facility] hospital lab) Nasopharyngeal Nasopharyngeal Swab     Status: None   Collection Time: 06/13/19  2:42 AM   Specimen: Nasopharyngeal Swab  Result Value Ref Range Status   SARS Coronavirus 2 NEGATIVE NEGATIVE Final    Comment: (NOTE) If result is NEGATIVE SARS-CoV-2 target nucleic acids are NOT DETECTED. The SARS-CoV-2 RNA is generally detectable in upper and lower  respiratory specimens during the acute phase of infection. The lowest  concentration of SARS-CoV-2 viral copies this assay can detect is 250  copies / mL. A negative result does not preclude SARS-CoV-2 infection  and should not be used as the sole basis for treatment or other  patient management decisions.  A negative result may occur with  improper specimen collection / handling, submission of specimen other  than nasopharyngeal swab, presence of viral mutation(s) within the  areas targeted by this assay, and inadequate number of viral copies  (<250 copies / mL). A negative result must be combined with clinical  observations, patient history, and epidemiological information. If result is POSITIVE SARS-CoV-2 target nucleic acids are DETECTED. The SARS-CoV-2 RNA is generally detectable in upper and lower  respiratory specimens dur ing the acute phase of infection.  Positive  results are indicative of active infection with SARS-CoV-2.  Clinical  correlation with patient history and other diagnostic information is  necessary to determine  patient infection status.  Positive results do  not rule out bacterial infection or co-infection with other viruses. If result is PRESUMPTIVE POSTIVE SARS-CoV-2 nucleic acids MAY BE PRESENT.   A presumptive positive result was obtained on the submitted specimen  and confirmed on repeat testing.  While 2019 novel coronavirus  (SARS-CoV-2) nucleic acids may be present in the submitted sample  additional confirmatory testing may be necessary for epidemiological  and / or clinical management purposes  to differentiate between  SARS-CoV-2 and other Sarbecovirus currently known to infect humans.  If clinically indicated additional testing with an alternate test  methodology [phone]) is advised. The SARS-CoV-2 RNA is generally  detectable in upper and lower respiratory sp ecimens during the acute  phase of infection. The expected result is Negative. Fact Sheet for Patients:  StrictlyIdeas.no Fact Sheet for Healthcare Providers: BankingDealers.co.za This test is not yet approved or cleared by the [place] FDA and has been authorized for detection and/or diagnosis of SARS-CoV-2 by FDA under an Emergency Use Authorization (EUA).  This EUA will remain in effect (meaning this test can be used) for the duration of the COVID-19 declaration under Section 564(b)(1) of the Act, 21 U.S.C. section 360bbb-3(b)(1), unless the authorization is terminated or revoked sooner. Performed at [facility], [address] [place], [place] [address]   MRSA PCR Screening     Status: None   Collection Time: 06/13/19  7:57 AM   Specimen: Nasal Mucosa; Nasopharyngeal  Result Value Ref Range Status   MRSA by PCR NEGATIVE NEGATIVE Final    Comment:        The GeneXpert MRSA Assay (FDA approved for NASAL specimens only), is one component of a comprehensive MRSA colonization surveillance program. It is not intended to diagnose MRSA infection nor to  guide or monitor treatment for MRSA infections. Performed at [facility], [address] [place], [place] [address]   CULTURE, BLOOD (ROUTINE X 2) w Reflex to ID Panel     Status: None (Preliminary result)   Collection Time: 06/13/19  8:54 AM   Specimen: BLOOD  Result Value Ref Range Status   Specimen Description BLOOD RIGHT HAND  Final   Special Requests   Final    BOTTLES DRAWN AEROBIC AND ANAEROBIC Blood Culture adequate volume   Culture   Final    NO GROWTH 2 DAYS Performed at [facility], [address], [place], [place] [address]    Report Status PENDING  Incomplete  CULTURE, BLOOD (ROUTINE X 2) w Reflex to ID Panel     Status: None (Preliminary result)   Collection Time: 06/13/19  8:59 AM   Specimen: BLOOD  Result Value Ref Range Status   Specimen Description BLOOD RIGHT HAND  Final   Special Requests   Final    BOTTLES DRAWN AEROBIC AND ANAEROBIC Blood Culture adequate volume  Culture   Final    NO GROWTH 2 DAYS Performed at [facility], [address] [place], [place] [address]    Report Status PENDING  Incomplete    Coagulation Studies: No results for input(s): LABPROT, INR in the last 72 hours.  Urinalysis: No results for input(s): COLORURINE, LABSPEC, PHURINE, GLUCOSEU, HGBUR, BILIRUBINUR, KETONESUR, PROTEINUR, UROBILINOGEN, NITRITE, LEUKOCYTESUR in the last 72 hours.  Invalid input(s): APPERANCEUR    Imaging: Dg Chest Port 1 View  Result Date: 06/14/2019 CLINICAL DATA:  Pulmonary infiltrates, status post dialysis EXAM: PORTABLE CHEST 1 VIEW COMPARISON:  06/13/2019 FINDINGS: No significant interval change in diffuse bilateral interstitial pulmonary opacity. No new or focal airspace opacity. Cardiomegaly. IMPRESSION: 1. No significant interval change in diffuse bilateral interstitial pulmonary opacity, consistent with edema. No new or focal airspace opacity. 2.  Cardiomegaly. Electronically Signed   By: Eddie Candle M.D.   On:  06/14/2019 09:30     Medications:   . sodium chloride    . sodium chloride    . sodium chloride    . nitroGLYCERIN 40 mcg/min (06/14/19 1138)   . amLODipine  10 mg Oral Daily  . aspirin EC  81 mg Oral Daily  . calcium acetate  1,334 mg Oral TID WC  . calcium-vitamin D  2 tablet Oral QHS  . Chlorhexidine Gluconate Cloth  6 each Topical Q0600  . epoetin (EPOGEN/PROCRIT) injection  4,000 Units Subcutaneous Q T,Th,Sa-HD  . ferric citrate  210 mg Oral TID WC  . furosemide  60 mg Intravenous BID  . guaiFENesin  600 mg Oral BID  . heparin  5,000 Units Subcutaneous Q8H  . insulin aspart  0-5 Units Subcutaneous QHS  . insulin aspart  0-9 Units Subcutaneous TID WC  . insulin detemir  30 Units Subcutaneous QHS  . lanthanum  3,000 mg Oral TID WC  . lisinopril  40 mg Oral Daily  . metoprolol succinate  50 mg Oral Daily  . multivitamin  1 tablet Oral Once per day on Mon Wed Fri  . rOPINIRole  1 mg Oral TID  . sodium chloride flush  3 mL Intravenous Q12H   sodium chloride, sodium chloride, sodium chloride, acetaminophen, alteplase, heparin, ipratropium-albuterol, lidocaine (PF), lidocaine-prilocaine, ondansetron (ZOFRAN) IV, pentafluoroprop-tetrafluoroeth, sodium chloride flush, zolpidem  Assessment/ Plan:  63 y.o. male with past medical history of ESRD on HD TTS, diabetes mellitus type 2, hypertension, anemia chronic kidney disease, secondary hyperparathyroidism who was admitted shortness of breath placed on BiPAP after having his dialysis on Saturday.  [facility]/[address]/TTHS/EDW 114  1.  ESRD on HD TTS/hyperkalemia.  Patient seen and evaluated during dialysis treatment today.  Tolerating well.  Potassium down to 4.9.  We plan to complete dialysis treatment today.  2.  Acute respiratory failure.  Respiratory status improved as compared to admission.  No longer requiring BiPAP.  3.  Secondary hyperparathyroidism.  Patient on calcium acetate, Fosrenol, and Auryxia.  Repeat serum  phosphorus today.  4.  Anemia chronic kidney disease.  Hemoglobin up to 9.5 today.  Maintain the patient on Epogen 4000 his IV with dialysis.   LOS: 2 Donald Silva 11/5/20209:51 AM

## 2019-06-15 NOTE — Progress Notes (Signed)
Inpatient Diabetes Program Recommendations  AACE/ADA: New Consensus Statement on Inpatient Glycemic Control   Target Ranges:  Prepandial:   less than 140 mg/dL      Peak postprandial:   less than 180 mg/dL (1-2 hours)      Critically ill patients:  140 - 180 mg/dL  Results for Donald Silva, Donald Silva (MRN [id number]) as of 06/15/2019 11:05  Ref. Range 06/15/2019 04:45  Glucose Latest Ref Range: 70 - 99 mg/dL 58 (L)   Results for Donald Silva, Donald Silva (MRN [id number]) as of 06/15/2019 11:05  Ref. Range 06/13/2019 07:54 06/14/2019 11:54 06/14/2019 17:30 06/14/2019 21:18 06/15/2019 07:23  Glucose-Capillary Latest Ref Range: 70 - 99 mg/dL 95 102 (H) 91 94  Levemir 15 units 80   Review of Glycemic Control  Diabetes history: DM2 Outpatient Diabetes medications: Levemir 66 units QHS Current orders for Inpatient glycemic control: Levemir 30 units QHS, Novolog 0-9 units TID with meals, Novolog 0-5 units QHS  Inpatient Diabetes Program Recommendations:   Insulin-Basal: Noted Levemir 15 units given last night and lab glucose 58 mg/dl at 4:45 am and finger stick glucose 80 mg/dl at 7:23 am today. Please consider decreasing Levemir to 10 units QHS.  Thanks, Barnie Alderman, RN, MSN, CDE Diabetes Coordinator Inpatient Diabetes Program [phone] (Team Pager from 8am to 5pm)

## 2019-06-15 NOTE — Discharge Instructions (Signed)
Acute Respiratory Failure, Adult ° °Acute respiratory failure occurs when there is not enough oxygen passing from your lungs to your body. When this happens, your lungs have trouble removing carbon dioxide from the blood. This causes your blood oxygen level to drop too low as carbon dioxide builds up. °Acute respiratory failure is a medical emergency. It can develop quickly, but it is temporary if treated promptly. Your lung capacity, or how much air your lungs can hold, may improve with time, exercise, and treatment. °What are the causes? °There are many possible causes of acute respiratory failure, including: °· Lung injury. °· Chest injury or damage to the ribs or tissues near the lungs. °· Lung conditions that affect the flow of air and blood into and out of the lungs, such as pneumonia, acute respiratory distress syndrome, and cystic fibrosis. °· Medical conditions, such as strokes or spinal cord injuries, that affect the muscles and nerves that control breathing. °· Blood infection (sepsis). °· Inflammation of the pancreas (pancreatitis). °· A blood clot in the lungs (pulmonary embolism). °· A large-volume blood transfusion. °· Burns. °· Near-drowning. °· Seizure. °· Smoke inhalation. °· Reaction to medicines. °· Alcohol or drug overdose. °What increases the risk? °This condition is more likely to develop in people who have: °· A blocked airway. °· Asthma. °· A condition or disease that damages or weakens the muscles, nerves, bones, or tissues that are involved in breathing. °· A serious infection. °· A health problem that blocks the unconscious reflex that is involved in breathing, such as hypothyroidism or sleep apnea. °· A lung injury or trauma. °What are the signs or symptoms? °Trouble breathing is the main symptom of acute respiratory failure. Symptoms may also include: °· Rapid breathing. °· Restlessness or anxiety. °· Skin, lips, or fingernails that appear blue (cyanosis). °· Rapid heart  rate. °· Abnormal heart rhythms (arrhythmias). °· Confusion or changes in behavior. °· Tiredness or loss of energy. °· Feeling sleepy or having a loss of consciousness. °How is this diagnosed? °Your health care provider can diagnose acute respiratory failure with a medical history and physical exam. During the exam, your health care provider will listen to your heart and check for crackling or wheezing sounds in your lungs. Your may also have tests to confirm the diagnosis and determine what is causing respiratory failure. These tests may include: °· Measuring the amount of oxygen in your blood (pulse oximetry). The measurement comes from a small device that is placed on your finger, earlobe, or toe. °· Other blood tests to measure blood gases and to look for signs of infection. °· Sampling your cerebral spinal fluid or tracheal fluid to check for infections. °· Chest X-ray to look for fluid in spaces that should be filled with air. °· Electrocardiogram (ECG) to look at the heart's electrical activity. °How is this treated? °Treatment for this condition usually takes places in a hospital intensive care unit (ICU). Treatment depends on what is causing the condition. It may include one or more treatments until your symptoms improve. Treatment may include: °· Supplemental oxygen. Extra oxygen is given through a tube in the nose, a face mask, or a hood. °· A device such as a continuous positive airway pressure (CPAP) or bi-level positive airway pressure (BiPAP or BPAP) machine. This treatment uses mild air pressure to keep the airways open. A mask or other device will be placed over your nose or mouth. A tube that is connected to a motor will deliver oxygen through the   mask. °· Ventilator. This treatment helps move air into and out of the lungs. This may be done with a bag and mask or a machine. For this treatment, a tube is placed in your windpipe (trachea) so air and oxygen can flow to the lungs. °· Extracorporeal  membrane oxygenation (ECMO). This treatment temporarily takes over the function of the heart and lungs, supplying oxygen and removing carbon dioxide. ECMO gives the lungs a chance to recover. It may be used if a ventilator is not effective. °· Tracheostomy. This is a procedure that creates a hole in the neck to insert a breathing tube. °· Receiving fluids and medicines. °· Rocking the bed to help breathing. °Follow these instructions at home: °· Take over-the-counter and prescription medicines only as told by your health care provider. °· Return to normal activities as told by your health care provider. Ask your health care provider what activities are safe for you. °· Keep all follow-up visits as told by your health care provider. This is important. °How is this prevented? °Treating infections and medical conditions that may lead to acute respiratory failure can help prevent the condition from developing. °Contact a health care provider if: °· You have a fever. °· Your symptoms do not improve or they get worse. °Get help right away if: °· You are having trouble breathing. °· You lose consciousness. °· Your have cyanosis or turn blue. °· You develop a rapid heart rate. °· You are confused. °These symptoms may represent a serious problem that is an emergency. Do not wait to see if the symptoms will go away. Get medical help right away. Call your local emergency services (911 in the [place] Do not drive yourself to the hospital. °This information is not intended to replace advice given to you by your health care provider. Make sure you discuss any questions you have with your health care provider. °Document Released: 08/01/2013 Document Revised: 07/09/2017 Document Reviewed: 02/12/2016 °[employer] Patient Education © 2020 [employer] ° °

## 2019-06-15 NOTE — Progress Notes (Signed)
This note also relates to the following rows which could not be included: Pulse Rate - Cannot attach notes to unvalidated device data Resp - Cannot attach notes to unvalidated device data BP - Cannot attach notes to unvalidated device data  Hd completed  

## 2019-06-15 NOTE — Progress Notes (Signed)
Pt for discharge home. Post dialysis today/ 2 liters drawn off.  Discharge instructions discussed with pt. meds diet activity and f/u. Verbalizes understanding. Ready for discharge

## 2019-06-15 NOTE — Progress Notes (Signed)
SATURATION QUALIFICATIONS: (This note is used to comply with regulatory documentation for home oxygen)  Patient Saturations on Room Air at Rest = 97  Patient Saturations on Room Air while Ambulating = 91 Patient Saturations on [place] of oxygen while Ambulating = 0  Please briefly explain why patient needs home oxygen:

## 2019-06-15 NOTE — Discharge Summary (Signed)
Physician Discharge Summary  Donald Silva ERD:[id number] DOB: 24-Sep-1955 DOA: 06/13/2019  PCP: Donald Median, MD  Admit date: 06/13/2019 Discharge date: 06/15/2019  Admitted From: Home  Disposition:  Home   Recommendations for Outpatient Follow-up:  1. Follow up with PCP in 1-2 weeks 2. Please obtain BMP/CBC in one week    Home Health: None  Equipment/Devices: None  Discharge Condition: Good  CODE STATUS: FULL Diet recommendation: Renal, diabetic  Brief/Interim Summary: Donald Silva is a 63 y.o. M with ESRD on HD, HTN, DM, and dCHF who presented with severe dyspnea, swelling after missed HD.  Placed on BiPAP and taken urgently to HD.     PRINCIPAL HOSPITAL DIAGNOSIS: Fluid overload    Discharge Diagnoses:   Fluid overload and hyperkalemia from missed dialysis in patient with End stage renal disease Patient taken to dialysis and had improvement in symptoms, pulmonary edema afterwards.   Diabetes  Hypertension Hypertensive urgency BP improved with dialysis.  Lisinopril added.  Anemia of CKD        Discharge Instructions  Discharge Instructions    Discharge instructions   Complete by: As directed    You were admitted for fluid overload. After dialysis, your problem was solved.   Please adhere to your medicines faithfully. Resume them as you were taking them before.  WATCH YOUR MORNING BLOOD SUGARS for the next few days. If your morning sugars are low, cut back on your night time levemir insulin dose (like cut down to 10 units or so)  Go to all your scheduled dialysis sessions and limit your fluid intake, it sounds like you've been mostly doing a great job of this, keep it up, I know it's hard.   Increase activity slowly   Complete by: As directed      Allergies as of 06/15/2019      Reactions   Penicillins Anaphylaxis   Was told it could kill him, pt unsure of reaction  Has patient had a PCN reaction causing immediate rash,  facial/tongue/throat swelling, SOB or lightheadedness with hypotension: No Has patient had a PCN reaction causing severe rash involving mucus membranes or skin necrosis: No Has patient had a PCN reaction that required hospitalization: No Has patient had a PCN reaction occurring within the last 10 years: No If all of the above answers are "NO", then may proceed with Cephalosporin use.   Cyclopentolate Other (See Comments)      Medication List    STOP taking these medications   clindamycin 300 MG capsule Commonly known as: Cleocin   gentamicin cream 0.1 % Commonly known as: GARAMYCIN     TAKE these medications   albuterol 108 (90 Base) MCG/ACT inhaler Commonly known as: VENTOLIN HFA Inhale 1-2 puffs into the lungs every 6 (six) hours as needed for wheezing or shortness of breath.   amLODipine 10 MG tablet Commonly known as: NORVASC Take 10 mg by mouth daily.   aspirin EC 81 MG tablet Take 81 mg daily by mouth.   Auryxia 1 GM 210 MG(Fe) tablet Generic drug: ferric citrate Take 210 mg by mouth 3 (three) times daily with meals.   b complex-vitamin c-folic acid 0.8 MG Tabs tablet Take 1 tablet by mouth every Monday, Wednesday, and Friday. After dialysis treatment   calcium acetate 667 MG capsule Commonly known as: PHOSLO Take 1,334 mg by mouth 3 (three) times daily with meals.   calcium carbonate 750 MG chewable tablet Commonly known as: TUMS EX Chew 2 tablets by mouth at  bedtime.   gabapentin 300 MG capsule Commonly known as: NEURONTIN Take 300 mg by mouth 3 (three) times daily.   heparin 1000 UNIT/ML injection Inject 8,000 Units into the vein every dialysis.   lanthanum 1000 MG chewable tablet Commonly known as: FOSRENOL Chew 3,000 mg by mouth 3 (three) times daily with meals.   Levemir FlexTouch 100 UNIT/ML Pen Generic drug: Insulin Detemir Inject 66 Units into the skin at bedtime.   lisinopril 40 MG tablet Commonly known as: ZESTRIL Take 40 mg by mouth  daily.   Mapap 325 MG tablet Generic drug: acetaminophen Take 325 mg by mouth every 4 (four) hours as needed for mild pain.   metoprolol succinate 50 MG 24 hr tablet Commonly known as: TOPROL-XL Take 50 mg by mouth daily.   rOPINIRole 1 MG tablet Commonly known as: REQUIP Take 1 mg by mouth 3 (three) times daily.      Follow-up Information    Donald Median, MD. Go on 06/21/2019.   Specialty: Family Medicine Why: teleconference video @ 1:20 pm  Contact information: [place] [address] [phone]          Allergies  Allergen Reactions  . Penicillins Anaphylaxis    Was told it could kill him, pt unsure of reaction  Has patient had a PCN reaction causing immediate rash, facial/tongue/throat swelling, SOB or lightheadedness with hypotension: No Has patient had a PCN reaction causing severe rash involving mucus membranes or skin necrosis: No Has patient had a PCN reaction that required hospitalization: No Has patient had a PCN reaction occurring within the last 10 years: No If all of the above answers are "NO", then may proceed with Cephalosporin use.   . Cyclopentolate Other (See Comments)    Consultations:  Nephrology  Critical care   Procedures/Studies: Dg Chest 2 View  Result Date: 05/22/2019 CLINICAL DATA:  Cough. EXAM: CHEST - 2 VIEW COMPARISON:  12/25/2013 and 11/19/2013 FINDINGS: There is a hazy left perihilar infiltrate. Bilateral peribronchial thickening. Slight interstitial infiltrate in the right lung. Heart size and pulmonary vascularity are within normal limits. No effusions. No acute bone abnormality. IMPRESSION: 1. Hazy infiltrate in the left perihilar region. 2. Slight interstitial infiltrate in the right lung. 3. Bronchitic changes. Electronically Signed   By: Lorriane Shire M.D.   On: 05/22/2019 15:19   Ct Head Wo Contrast  Result Date: 05/23/2019 CLINICAL DATA:  Gait disturbance and weakness. Twitching  sensations. End-stage renal disease. EXAM: CT HEAD WITHOUT CONTRAST TECHNIQUE: Contiguous axial images were obtained from the base of the skull through the vertex without intravenous contrast. COMPARISON:  None. FINDINGS: Brain: Ventricles and sulci are within normal limits for age. There is no intracranial mass, hemorrhage, extra-axial fluid collection, or midline shift. There is minimal small vessel disease in the centra semiovale bilaterally. No acute infarct evident. Vascular: There is no hyperdense vessel. No vascular calcification evident. Skull: The bony calvarium appears intact. Sinuses/Orbits: There is slight mucosal thickening in several ethmoid air cells. A small bony defect is noted in the medial right orbital wall. Visualized orbits otherwise appear symmetric bilaterally. Other: There is slight opacification in inferior mastoid air cells on the right. Other visualized mastoid air cells bilaterally are clear. Although incompletely visualized, there is apparent advanced arthropathy in the right temporomandibular joint. IMPRESSION: 1. Slight periventricular small vessel disease. No acute infarct. No mass or hemorrhage. 2. 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 3.  Mild mucosal thickening in several  ethmoid air cells. 4. Opacification in several inferior mastoid air cells on the right. Electronically Signed   By: Lowella Grip III M.D.   On: 05/23/2019 13:57   Dg Chest Port 1 View  Result Date: 06/14/2019 CLINICAL DATA:  Pulmonary infiltrates, status post dialysis EXAM: PORTABLE CHEST 1 VIEW COMPARISON:  06/13/2019 FINDINGS: No significant interval change in diffuse bilateral interstitial pulmonary opacity. No new or focal airspace opacity. Cardiomegaly. IMPRESSION: 1. No significant interval change in diffuse bilateral interstitial pulmonary opacity, consistent with edema. No new or focal airspace opacity. 2.  Cardiomegaly. Electronically Signed    By: Eddie Candle M.D.   On: 06/14/2019 09:30   Dg Chest Portable 1 View  Result Date: 06/13/2019 CLINICAL DATA:  Shortness of breath EXAM: PORTABLE CHEST 1 VIEW COMPARISON:  05/23/2019 FINDINGS: Heart is borderline in size. Low lung volumes. Patchy bilateral airspace opacities. No effusions. No acute bony abnormality. IMPRESSION: Low lung volumes with patchy bilateral airspace opacities concerning for pneumonia. Electronically Signed   By: Rolm Baptise M.D.   On: 06/13/2019 03:06   Dg Chest Port 1 View  Result Date: 05/23/2019 CLINICAL DATA:  Acute respiratory failure and wheezing. The patient missed his last dialysis. EXAM: PORTABLE CHEST 1 VIEW COMPARISON:  Single-view of the chest 05/22/2019 and 12/25/2013. FINDINGS: Hazy airspace opacity in left lower lung zone is unchanged. There is mild interstitial edema. No pneumothorax or pleural effusion. IMPRESSION: No change in mild, hazy airspace opacity in left lung base. No change in mild interstitial edema. Electronically Signed   By: Inge Rise M.D.   On: 05/23/2019 10:57   Dg Foot 2 Views Right  Result Date: 05/23/2019 CLINICAL DATA:  Wound of the right foot. Previous partial amputation of the first ray. EXAM: RIGHT FOOT - 2 VIEW COMPARISON:  03/31/2019 FINDINGS: There is air in the soft tissues at the site of the wound. The surgical margin of the first metatarsal is sharp. There is some periosteal new bone at the stump of the first metatarsal which is to the expected degree. Previous amputation of the fifth toe. Slight dorsal spurring at the tarsal metatarsal joints. No other significant abnormalities. IMPRESSION: No evidence of osteomyelitis.  Soft tissue wound. Electronically Signed   By: Lorriane Shire M.D.   On: 05/23/2019 20:43      Subjective: Feeling well.  Frustrated to end up this way after one missed dialysis session.  Hungry.  No chest pain, dyspnea.  Discharge Exam: Vitals:   06/15/19 1245 06/15/19 1250  BP:  (!) 155/74   Pulse: 81 80  Resp: 19 20  Temp:    SpO2:     Vitals:   06/15/19 1215 06/15/19 1230 06/15/19 1245 06/15/19 1250  BP: (!) 171/77 (!) 179/83  (!) 155/74  Pulse: 77 81 81 80  Resp: 20 16 19 20   Temp:      TempSrc:      SpO2:      Weight:      Height:        General: Pt is alert, awake, not in acute distress, seen on dialysis Cardiovascular: RRR, nl S1-S2, no murmurs appreciated.   No LE edema.   Respiratory: Normal respiratory rate and rhythm.  CTAB without rales or wheezes. Abdominal: Abdomen soft and non-tender.  No distension or HSM.   Neuro/Psych: Strength symmetric in upper and lower extremities.  Judgment and insight appear normal.   The results of significant diagnostics from this hospitalization (including imaging, microbiology, ancillary and laboratory) are  listed below for reference.     Microbiology: Recent Results (from the past 240 hour(s))  SARS Coronavirus 2 by RT PCR (hospital order, performed in [facility] hospital lab) Nasopharyngeal Nasopharyngeal Swab     Status: None   Collection Time: 06/13/19  2:42 AM   Specimen: Nasopharyngeal Swab  Result Value Ref Range Status   SARS Coronavirus 2 NEGATIVE NEGATIVE Final    Comment: (NOTE) If result is NEGATIVE SARS-CoV-2 target nucleic acids are NOT DETECTED. The SARS-CoV-2 RNA is generally detectable in upper and lower  respiratory specimens during the acute phase of infection. The lowest  concentration of SARS-CoV-2 viral copies this assay can detect is 250  copies / mL. A negative result does not preclude SARS-CoV-2 infection  and should not be used as the sole basis for treatment or other  patient management decisions.  A negative result may occur with  improper specimen collection / handling, submission of specimen other  than nasopharyngeal swab, presence of viral mutation(s) within the  areas targeted by this assay, and inadequate number of viral copies  (<250 copies / mL). A negative result must be  combined with clinical  observations, patient history, and epidemiological information. If result is POSITIVE SARS-CoV-2 target nucleic acids are DETECTED. The SARS-CoV-2 RNA is generally detectable in upper and lower  respiratory specimens dur ing the acute phase of infection.  Positive  results are indicative of active infection with SARS-CoV-2.  Clinical  correlation with patient history and other diagnostic information is  necessary to determine patient infection status.  Positive results do  not rule out bacterial infection or co-infection with other viruses. If result is PRESUMPTIVE POSTIVE SARS-CoV-2 nucleic acids MAY BE PRESENT.   A presumptive positive result was obtained on the submitted specimen  and confirmed on repeat testing.  While 2019 novel coronavirus  (SARS-CoV-2) nucleic acids may be present in the submitted sample  additional confirmatory testing may be necessary for epidemiological  and / or clinical management purposes  to differentiate between  SARS-CoV-2 and other Sarbecovirus currently known to infect humans.  If clinically indicated additional testing with an alternate test  methodology [id number]) is advised. The SARS-CoV-2 RNA is generally  detectable in upper and lower respiratory sp ecimens during the acute  phase of infection. The expected result is Negative. Fact Sheet for Patients:  StrictlyIdeas.no Fact Sheet for Healthcare Providers: BankingDealers.co.za This test is not yet approved or cleared by the [place] FDA and has been authorized for detection and/or diagnosis of SARS-CoV-2 by FDA under an Emergency Use Authorization (EUA).  This EUA will remain in effect (meaning this test can be used) for the duration of the COVID-19 declaration under Section 564(b)(1) of the Act, 21 U.S.C. section 360bbb-3(b)(1), unless the authorization is terminated or revoked sooner. Performed at [facility], [address] [place], [place] [address]   MRSA PCR Screening     Status: None   Collection Time: 06/13/19  7:57 AM   Specimen: Nasal Mucosa; Nasopharyngeal  Result Value Ref Range Status   MRSA by PCR NEGATIVE NEGATIVE Final    Comment:        The GeneXpert MRSA Assay (FDA approved for NASAL specimens only), is one component of a comprehensive MRSA colonization surveillance program. It is not intended to diagnose MRSA infection nor to guide or monitor treatment for MRSA infections. Performed at [facility], [address] [place], [place] [address]   CULTURE, BLOOD (ROUTINE X 2) w Reflex to  ID Panel     Status: None (Preliminary result)   Collection Time: 06/13/19  8:54 AM   Specimen: BLOOD  Result Value Ref Range Status   Specimen Description BLOOD RIGHT HAND  Final   Special Requests   Final    BOTTLES DRAWN AEROBIC AND ANAEROBIC Blood Culture adequate volume   Culture   Final    NO GROWTH 2 DAYS Performed at [facility], [address] [place], [place] [address]    Report Status PENDING  Incomplete  CULTURE, BLOOD (ROUTINE X 2) w Reflex to ID Panel     Status: None (Preliminary result)   Collection Time: 06/13/19  8:59 AM   Specimen: BLOOD  Result Value Ref Range Status   Specimen Description BLOOD RIGHT HAND  Final   Special Requests   Final    BOTTLES DRAWN AEROBIC AND ANAEROBIC Blood Culture adequate volume   Culture   Final    NO GROWTH 2 DAYS Performed at [facility], [address], [place], [place] [address]    Report Status PENDING  Incomplete     Labs: BNP (last 3 results) No results for input(s): BNP in the last 8760 hours. Basic Metabolic Panel: Recent Labs  Lab 06/13/19 0242 06/13/19 1654 06/14/19 0449 06/15/19 0445 06/15/19 0930  NA 142  --  141 138  --   K 7.0* 3.7 4.6 4.9  --   CL 101  --  94* 92*  --   CO2 23  --  29 28  --   GLUCOSE 168*  --  66* 58*  --   BUN 150*  --  90* 102*  --    CREATININE 20.99*  --  15.61* 17.49*  --   CALCIUM 8.3*  --  8.4* 8.6*  --   MG  --   --  2.5*  --   --   PHOS  --   --  5.7*  --  5.5*   Liver Function Tests: Recent Labs  Lab 06/13/19 0242 06/14/19 0449  AST 67* 23  ALT 85* 46*  ALKPHOS 81 65  BILITOT 0.7 0.7  PROT 8.8* 7.3  ALBUMIN 4.3 3.3*   No results for input(s): LIPASE, AMYLASE in the last 168 hours. No results for input(s): AMMONIA in the last 168 hours. CBC: Recent Labs  Lab 06/13/19 0242 06/14/19 0449 06/15/19 0445  WBC 9.8 6.6 8.5  NEUTROABS 8.2* 4.7 4.9  HGB 10.6* 8.8* 9.5*  HCT 33.7* 26.7* 28.9*  MCV 99.7 95.7 95.1  PLT 209 151 165   Cardiac Enzymes: No results for input(s): CKTOTAL, CKMB, CKMBINDEX, TROPONINI in the last 168 hours. BNP: Invalid input(s): POCBNP CBG: Recent Labs  Lab 06/13/19 0754 06/14/19 1154 06/14/19 1730 06/14/19 2118 06/15/19 0723  GLUCAP 95 102* 91 94 80   D-Dimer No results for input(s): DDIMER in the last 72 hours. Hgb A1c No results for input(s): HGBA1C in the last 72 hours. Lipid Profile No results for input(s): CHOL, HDL, LDLCALC, TRIG, CHOLHDL, LDLDIRECT in the last 72 hours. Thyroid function studies Recent Labs    06/13/19 0854  TSH 1.505   Anemia work up No results for input(s): VITAMINB12, FOLATE, FERRITIN, TIBC, IRON, RETICCTPCT in the last 72 hours. Urinalysis    Component Value Date/Time   COLORURINE [place] 11/19/2013 1458   APPEARANCEUR Clear 11/19/2013 1458   LABSPEC 1.008 11/19/2013 1458   PHURINE 6.0 11/19/2013 1458   GLUCOSEU 150 mg/dL 11/19/2013 1458   HGBUR 2+ 11/19/2013  [place] Negative 11/19/2013 1458   KETONESUR Negative 11/19/2013 1458   PROTEINUR 30 mg/dL 11/19/2013 1458   NITRITE Negative 11/19/2013 1458   LEUKOCYTESUR Negative 11/19/2013 1458   Sepsis Labs Invalid input(s): PROCALCITONIN,  WBC,  LACTICIDVEN Microbiology Recent Results (from the past 240 hour(s))  SARS Coronavirus 2 by RT PCR (hospital order,  performed in [facility] hospital lab) Nasopharyngeal Nasopharyngeal Swab     Status: None   Collection Time: 06/13/19  2:42 AM   Specimen: Nasopharyngeal Swab  Result Value Ref Range Status   SARS Coronavirus 2 NEGATIVE NEGATIVE Final    Comment: (NOTE) If result is NEGATIVE SARS-CoV-2 target nucleic acids are NOT DETECTED. The SARS-CoV-2 RNA is generally detectable in upper and lower  respiratory specimens during the acute phase of infection. The lowest  concentration of SARS-CoV-2 viral copies this assay can detect is 250  copies / mL. A negative result does not preclude SARS-CoV-2 infection  and should not be used as the sole basis for treatment or other  patient management decisions.  A negative result may occur with  improper specimen collection / handling, submission of specimen other  than nasopharyngeal swab, presence of viral mutation(s) within the  areas targeted by this assay, and inadequate number of viral copies  (<250 copies / mL). A negative result must be combined with clinical  observations, patient history, and epidemiological information. If result is POSITIVE SARS-CoV-2 target nucleic acids are DETECTED. The SARS-CoV-2 RNA is generally detectable in upper and lower  respiratory specimens dur ing the acute phase of infection.  Positive  results are indicative of active infection with SARS-CoV-2.  Clinical  correlation with patient history and other diagnostic information is  necessary to determine patient infection status.  Positive results do  not rule out bacterial infection or co-infection with other viruses. If result is PRESUMPTIVE POSTIVE SARS-CoV-2 nucleic acids MAY BE PRESENT.   A presumptive positive result was obtained on the submitted specimen  and confirmed on repeat testing.  While 2019 novel coronavirus  (SARS-CoV-2) nucleic acids may be present in the submitted sample  additional confirmatory testing may be necessary for epidemiological  and / or  clinical management purposes  to differentiate between  SARS-CoV-2 and other Sarbecovirus currently known to infect humans.  If clinically indicated additional testing with an alternate test  methodology [phone]) is advised. The SARS-CoV-2 RNA is generally  detectable in upper and lower respiratory sp ecimens during the acute  phase of infection. The expected result is Negative. Fact Sheet for Patients:  StrictlyIdeas.no Fact Sheet for Healthcare Providers: BankingDealers.co.za This test is not yet approved or cleared by the [place] FDA and has been authorized for detection and/or diagnosis of SARS-CoV-2 by FDA under an Emergency Use Authorization (EUA).  This EUA will remain in effect (meaning this test can be used) for the duration of the COVID-19 declaration under Section 564(b)(1) of the Act, 21 U.S.C. section 360bbb-3(b)(1), unless the authorization is terminated or revoked sooner. Performed at [facility], [address] [place], [place] [address]   MRSA PCR Screening     Status: None   Collection Time: 06/13/19  7:57 AM   Specimen: Nasal Mucosa; Nasopharyngeal  Result Value Ref Range Status   MRSA by PCR NEGATIVE NEGATIVE Final    Comment:        The GeneXpert MRSA Assay (FDA approved for NASAL specimens only), is one component of a comprehensive MRSA colonization surveillance program. It is not intended  to diagnose MRSA infection nor to guide or monitor treatment for MRSA infections. Performed at [facility], [address] [place], [place] [address]   CULTURE, BLOOD (ROUTINE X 2) w Reflex to ID Panel     Status: None (Preliminary result)   Collection Time: 06/13/19  8:54 AM   Specimen: BLOOD  Result Value Ref Range Status   Specimen Description BLOOD RIGHT HAND  Final   Special Requests   Final    BOTTLES DRAWN AEROBIC AND ANAEROBIC Blood Culture adequate volume   Culture   Final    NO  GROWTH 2 DAYS Performed at [facility], [address] [place], [place] [address]    Report Status PENDING  Incomplete  CULTURE, BLOOD (ROUTINE X 2) w Reflex to ID Panel     Status: None (Preliminary result)   Collection Time: 06/13/19  8:59 AM   Specimen: BLOOD  Result Value Ref Range Status   Specimen Description BLOOD RIGHT HAND  Final   Special Requests   Final    BOTTLES DRAWN AEROBIC AND ANAEROBIC Blood Culture adequate volume   Culture   Final    NO GROWTH 2 DAYS Performed at [facility], [address] [place], [place] [address]    Report Status PENDING  Incomplete     Time coordinating discharge: 25 minutes      SIGNED:   Edwin Dada, MD  [facility] 06/15/2019, 4:46 PM

## 2019-06-15 NOTE — TOC Initial Note (Signed)
Transition of Care [facility]) - Initial/Assessment Note    Patient Details  Name: Donald Silva MRN: [id number] Date of Birth: [birth year]  Transition of Care [facility]) CM/SW Contact:    Shelbie Hutching, RN Phone Number: 06/15/2019, 2:40 PM  Clinical Narrative:                 Patient admitted for flash pulmonary edema.  Patient is medically stable and ready for discharge today.  Patient is TTS HD.  Dialysis Coordinator Elvera Bicker aware of admission.  Patient completed dialysis treatment today, wife is at the bedside and will provide transportation home.  Patient drives himself to dialysis treatments.  Patient is current with PCP at [facility] and gets prescriptions from [facility].   No discharge needs identified at this time.   Expected Discharge Plan: Home/Self Care Barriers to Discharge: No Barriers Identified   Patient Goals and CMS Choice Patient states their goals for this hospitalization and ongoing recovery are:: Ready to go home      Expected Discharge Plan and Services Expected Discharge Plan: Home/Self Care       Living arrangements for the past 2 months: Single Family Home Expected Discharge Date: 06/15/19                                    Prior Living Arrangements/Services Living arrangements for the past 2 months: Single Family Home Lives with:: Spouse Patient language and need for interpreter reviewed:: Yes Do you feel safe going back to the place where you live?: Yes      Need for Family Participation in Patient Care: Yes (Comment)(dialysis patient) Care giver support system in place?: Yes (comment)(wife)   Criminal Activity/Legal Involvement Pertinent to Current Situation/Hospitalization: No - Comment as needed  Activities of Daily Living Home Assistive Devices/Equipment: None ADL Screening (condition at time of admission) Patient's cognitive ability adequate to safely complete daily activities?: Yes Is the patient deaf or have difficulty  hearing?: No Does the patient have difficulty seeing, even when wearing glasses/contacts?: No Does the patient have difficulty concentrating, remembering, or making decisions?: No Patient able to express need for assistance with ADLs?: Yes Does the patient have difficulty dressing or bathing?: No Independently performs ADLs?: Yes (appropriate for developmental age) Does the patient have difficulty walking or climbing stairs?: Yes Weakness of Legs: None Weakness of Arms/Hands: None  Permission Sought/Granted   Permission granted to share information with : Yes, Verbal Permission Granted              Emotional Assessment Appearance:: Appears stated age Attitude/Demeanor/Rapport: Engaged Affect (typically observed): Accepting Orientation: : Oriented to Self, Oriented to Place, Oriented to  Time, Oriented to Situation Alcohol / Substance Use: Not Applicable Psych Involvement: No (comment)  Admission diagnosis:  Hyperkalemia [E87.5] Flash pulmonary edema (HCC) [J81.0] Acute respiratory failure with hypoxia ([place]) [J96.01] Patient Active Problem List   Diagnosis Date Noted  . Acute respiratory failure ([place]) 06/13/2019  . Flash pulmonary edema ([place])   . ESRD (end stage renal disease) ([place])   . Acute CHF (congestive heart failure) ([place]) 05/22/2019  . Postop check 03/02/2019  . Diabetes 1.5, managed as type 2 ([place]) 12/15/2018  . Complication of vascular access for dialysis 04/20/2018  . Chronic kidney disease, stage V ([place]) 08/11/2017  . Essential hypertension 08/11/2017  . 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[place]) 08/11/2017  . Hyperkalemia 06/24/2017  . Multiple-type  hyperlipidemia 03/25/2012  . Vitamin D deficiency 03/25/2012  . ED (erectile dysfunction) of organic origin 09/14/2011   PCP:  Letta Median, MD Pharmacy:   [facility], [place] [address] Phone: [phone]  Fax: [phone]  [id number] - [address], [place] [address] [place] [address] Phone: [phone] Fax: [facility], [place] [address] Phone: [phone] Fax: [phone]     Social Determinants of Health (SDOH) Interventions    Readmission Risk Interventions Readmission Risk Prevention Plan 06/15/2019 05/24/2019  Transportation Screening Complete -  PCP or Specialist Appt within 3-5 Days Complete Complete  HRI or Home Care Consult Complete (No Data)  Social Work Consult for [place] Planning/Counseling Complete -  [place] Screening Not Applicable Not Applicable  Medication Review Press photographer) Complete Complete  Some recent data might be hidden

## 2019-06-15 NOTE — Progress Notes (Signed)
This note also relates to the following rows which could not be included: Pulse Rate - Cannot attach notes to unvalidated device data Resp - Cannot attach notes to unvalidated device data  Hd started  

## 2019-06-16 ENCOUNTER — Other Ambulatory Visit: Payer: Medicare Other | Admitting: Podiatry

## 2019-06-18 LAB — CULTURE, BLOOD (ROUTINE X 2)
Culture: NO GROWTH
Culture: NO GROWTH
Special Requests: ADEQUATE
Special Requests: ADEQUATE

## 2019-06-20 ENCOUNTER — Encounter: Payer: Self-pay | Admitting: Podiatry

## 2019-06-20 ENCOUNTER — Ambulatory Visit (INDEPENDENT_AMBULATORY_CARE_PROVIDER_SITE_OTHER): Payer: Medicare Other | Admitting: Podiatry

## 2019-06-20 ENCOUNTER — Ambulatory Visit: Payer: Medicare Other

## 2019-06-20 ENCOUNTER — Other Ambulatory Visit: Payer: Self-pay

## 2019-06-20 DIAGNOSIS — E0843 Diabetes mellitus due to underlying condition with diabetic autonomic (poly)neuropathy: Secondary | ICD-10-CM

## 2019-06-20 DIAGNOSIS — L97512 Non-pressure chronic ulcer of other part of right foot with fat layer exposed: Secondary | ICD-10-CM | POA: Diagnosis not present

## 2019-06-20 DIAGNOSIS — Z89421 Acquired absence of other right toe(s): Secondary | ICD-10-CM

## 2019-06-20 MED ORDER — OXYCODONE-ACETAMINOPHEN 5-325 MG PO TABS: 1.0000 | ORAL_TABLET | Freq: Four times a day (QID) | ORAL | 0 refills | Status: DC | PRN

## 2019-06-23 NOTE — Progress Notes (Signed)
   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 DOS: 03/02/2019. He states the wound is about the same and has not changed much since his last appointment. He has been taking the Gabapentin with no significant relief. He denies worsening factors. Patient is here for further evaluation and treatment.   Past Medical History:  Diagnosis Date  . CKD (chronic kidney disease) stage 5, GFR less than 15 ml/min (HCC)   . Diabetes mellitus without complication ([place])   . Dyspnea   . GERD (gastroesophageal reflux disease)   . Hypertension   . Lower extremity edema   . Myocardial infarction [facility])       Objective/Physical Exam Neurovascular status intact. Ulceration measuring 2.0  1.0 x 0.6 cm noted to central incision with moderate drainage. No active bleeding noted. Moderate edema noted to the surgical extremity.  Assessment: 1. 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 DOS: 03/02/2019 2. 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 Patient was evaluated.   2. Medically necessary excisional debridement including subcutaneous tissue was performed using a tissue nipper and a chisel blade. Excisional debridement of all the necrotic nonviable tissue down to healthy bleeding viable tissue was performed with post-debridement measurements same as pre-. 3. The wound was cleansed and dry sterile dressing applied. 4. Continue using Aquacel Ag daily with a dry sterile dressing.  5. Continue using post op shoe.  6. Return to clinic in 4 weeks.    Edrick Kins, DPM [facility]  Dr. Edrick Kins, [place]                                        [place], [place] [address]                Office [phone]  Fax [phone]

## 2019-07-18 ENCOUNTER — Inpatient Hospital Stay: Payer: Medicare Other

## 2019-07-18 ENCOUNTER — Encounter: Payer: Medicare Other | Admitting: Podiatry

## 2019-07-18 ENCOUNTER — Inpatient Hospital Stay
Admission: EM | Admit: 2019-07-18 | Discharge: 2019-07-22 | DRG: 177 | Disposition: A | Discharge status: Home / Self Care | Payer: BC Managed Care – PPO | Attending: Internal Medicine | Admitting: Internal Medicine

## 2019-07-18 ENCOUNTER — Other Ambulatory Visit: Payer: Self-pay

## 2019-07-18 ENCOUNTER — Emergency Department: Payer: BC Managed Care – PPO

## 2019-07-18 DIAGNOSIS — Z8249 Family history of ischemic heart disease and other diseases of the circulatory system: Secondary | ICD-10-CM

## 2019-07-18 DIAGNOSIS — I160 Hypertensive urgency: Secondary | ICD-10-CM

## 2019-07-18 DIAGNOSIS — J1282 Pneumonia due to coronavirus disease 2019: Secondary | ICD-10-CM

## 2019-07-18 DIAGNOSIS — U071 COVID-19: Principal | ICD-10-CM

## 2019-07-18 DIAGNOSIS — I252 Old myocardial infarction: Secondary | ICD-10-CM | POA: Diagnosis not present

## 2019-07-18 DIAGNOSIS — J9622 Acute and chronic respiratory failure with hypercapnia: Secondary | ICD-10-CM | POA: Diagnosis not present

## 2019-07-18 DIAGNOSIS — J9621 Acute and chronic respiratory failure with hypoxia: Secondary | ICD-10-CM | POA: Diagnosis present

## 2019-07-18 DIAGNOSIS — R0602 Shortness of breath: Secondary | ICD-10-CM | POA: Diagnosis present

## 2019-07-18 DIAGNOSIS — Z888 Allergy status to other drugs, medicaments and biological substances status: Secondary | ICD-10-CM

## 2019-07-18 DIAGNOSIS — N2581 Secondary hyperparathyroidism of renal origin: Secondary | ICD-10-CM | POA: Diagnosis present

## 2019-07-18 DIAGNOSIS — E8770 Fluid overload, unspecified: Secondary | ICD-10-CM | POA: Diagnosis not present

## 2019-07-18 DIAGNOSIS — N186 End stage renal disease: Secondary | ICD-10-CM | POA: Diagnosis present

## 2019-07-18 DIAGNOSIS — Z794 Long term (current) use of insulin: Secondary | ICD-10-CM | POA: Diagnosis not present

## 2019-07-18 DIAGNOSIS — K219 Gastro-esophageal reflux disease without esophagitis: Secondary | ICD-10-CM | POA: Diagnosis present

## 2019-07-18 DIAGNOSIS — G2581 Restless legs syndrome: Secondary | ICD-10-CM | POA: Diagnosis present

## 2019-07-18 DIAGNOSIS — Z7982 Long term (current) use of aspirin: Secondary | ICD-10-CM | POA: Diagnosis not present

## 2019-07-18 DIAGNOSIS — Z833 Family history of diabetes mellitus: Secondary | ICD-10-CM | POA: Diagnosis not present

## 2019-07-18 DIAGNOSIS — I248 Other forms of acute ischemic heart disease: Secondary | ICD-10-CM | POA: Diagnosis not present

## 2019-07-18 DIAGNOSIS — I132 Hypertensive heart and chronic kidney disease with heart failure and with stage 5 chronic kidney disease, or end stage renal disease: Secondary | ICD-10-CM | POA: Diagnosis present

## 2019-07-18 DIAGNOSIS — Z880 Allergy status to penicillin: Secondary | ICD-10-CM

## 2019-07-18 DIAGNOSIS — D631 Anemia in chronic kidney disease: Secondary | ICD-10-CM | POA: Diagnosis present

## 2019-07-18 DIAGNOSIS — E1122 Type 2 diabetes mellitus with diabetic chronic kidney disease: Secondary | ICD-10-CM | POA: Diagnosis present

## 2019-07-18 DIAGNOSIS — I10 Essential (primary) hypertension: Secondary | ICD-10-CM | POA: Diagnosis not present

## 2019-07-18 DIAGNOSIS — J1289 Other viral pneumonia: Secondary | ICD-10-CM | POA: Diagnosis present

## 2019-07-18 DIAGNOSIS — R0603 Acute respiratory distress: Secondary | ICD-10-CM

## 2019-07-18 DIAGNOSIS — Z992 Dependence on renal dialysis: Secondary | ICD-10-CM

## 2019-07-18 DIAGNOSIS — I5031 Acute diastolic (congestive) heart failure: Secondary | ICD-10-CM | POA: Diagnosis present

## 2019-07-18 DIAGNOSIS — J9601 Acute respiratory failure with hypoxia: Secondary | ICD-10-CM | POA: Diagnosis not present

## 2019-07-18 DIAGNOSIS — G4700 Insomnia, unspecified: Secondary | ICD-10-CM | POA: Diagnosis present

## 2019-07-18 DIAGNOSIS — E875 Hyperkalemia: Secondary | ICD-10-CM

## 2019-07-18 DIAGNOSIS — R0902 Hypoxemia: Secondary | ICD-10-CM

## 2019-07-18 DIAGNOSIS — J9620 Acute and chronic respiratory failure, unspecified whether with hypoxia or hypercapnia: Secondary | ICD-10-CM | POA: Diagnosis present

## 2019-07-18 LAB — HEPATITIS B SURFACE ANTIGEN: Hepatitis B Surface Ag: NONREACTIVE

## 2019-07-18 LAB — BASIC METABOLIC PANEL
Anion gap: 19 — ABNORMAL HIGH (ref 5–15)
BUN: 78 mg/dL — ABNORMAL HIGH (ref 8–23)
CO2: 21 mmol/L — ABNORMAL LOW (ref 22–32)
Calcium: 7.8 mg/dL — ABNORMAL LOW (ref 8.9–10.3)
Chloride: 97 mmol/L — ABNORMAL LOW (ref 98–111)
Creatinine, Ser: 15.34 mg/dL — ABNORMAL HIGH (ref 0.61–1.24)
GFR calc Af Amer: 3 mL/min — ABNORMAL LOW (ref >60)
GFR calc non Af Amer: 3 mL/min — ABNORMAL LOW (ref >60)
Glucose, Bld: 132 mg/dL — ABNORMAL HIGH (ref 70–99)
Potassium: 4.8 mmol/L (ref 3.5–5.1)
Sodium: 137 mmol/L (ref 135–145)

## 2019-07-18 LAB — BLOOD GAS, ARTERIAL
Acid-base deficit: 5.8 mmol/L — ABNORMAL HIGH (ref 0.0–2.0)
Acid-base deficit: 0.2 mmol/L (ref 0.0–2.0)
Bicarbonate: 22.2 mmol/L (ref 20.0–28.0)
Bicarbonate: 24.8 mmol/L (ref 20.0–28.0)
FIO2: 0.36
FIO2: 0.44
O2 Saturation: 85.1 %
O2 Saturation: 56.6 %
Patient temperature: 37
Patient temperature: 37
pCO2 arterial: 53 mmHg — ABNORMAL HIGH (ref 32.0–48.0)
pCO2 arterial: 41 mmHg (ref 32.0–48.0)
pH, Arterial: 7.23 — ABNORMAL LOW (ref 7.350–7.450)
pH, Arterial: 7.39 (ref 7.350–7.450)
pO2, Arterial: 60 mmHg — ABNORMAL LOW (ref 83.0–108.0)
pO2, Arterial: <31 mmHg — CL (ref 83.0–108.0)

## 2019-07-18 LAB — COMPREHENSIVE METABOLIC PANEL
ALT: 80 U/L — ABNORMAL HIGH (ref 0–44)
ALT: 82 U/L — ABNORMAL HIGH (ref 0–44)
AST: 67 U/L — ABNORMAL HIGH (ref 15–41)
AST: 64 U/L — ABNORMAL HIGH (ref 15–41)
Albumin: 4.4 g/dL (ref 3.5–5.0)
Albumin: 4.5 g/dL (ref 3.5–5.0)
Alkaline Phosphatase: 117 U/L (ref 38–126)
Alkaline Phosphatase: 128 U/L — ABNORMAL HIGH (ref 38–126)
Anion gap: 18 — ABNORMAL HIGH (ref 5–15)
Anion gap: 19 — ABNORMAL HIGH (ref 5–15)
BUN: 99 mg/dL — ABNORMAL HIGH (ref 8–23)
BUN: 106 mg/dL — ABNORMAL HIGH (ref 8–23)
CO2: 22 mmol/L (ref 22–32)
CO2: 21 mmol/L — ABNORMAL LOW (ref 22–32)
Calcium: 7.5 mg/dL — ABNORMAL LOW (ref 8.9–10.3)
Calcium: 7.3 mg/dL — ABNORMAL LOW (ref 8.9–10.3)
Chloride: 98 mmol/L (ref 98–111)
Chloride: 96 mmol/L — ABNORMAL LOW (ref 98–111)
Creatinine, Ser: 18.77 mg/dL — ABNORMAL HIGH (ref 0.61–1.24)
Creatinine, Ser: 18.67 mg/dL — ABNORMAL HIGH (ref 0.61–1.24)
GFR calc Af Amer: 3 mL/min — ABNORMAL LOW (ref >60)
GFR calc Af Amer: 3 mL/min — ABNORMAL LOW (ref >60)
GFR calc non Af Amer: 2 mL/min — ABNORMAL LOW (ref >60)
GFR calc non Af Amer: 2 mL/min — ABNORMAL LOW (ref >60)
Glucose, Bld: 140 mg/dL — ABNORMAL HIGH (ref 70–99)
Glucose, Bld: 214 mg/dL — ABNORMAL HIGH (ref 70–99)
Potassium: 6.6 mmol/L (ref 3.5–5.1)
Potassium: 6.1 mmol/L — ABNORMAL HIGH (ref 3.5–5.1)
Sodium: 138 mmol/L (ref 135–145)
Sodium: 136 mmol/L (ref 135–145)
Total Bilirubin: 0.6 mg/dL (ref 0.3–1.2)
Total Bilirubin: 0.5 mg/dL (ref 0.3–1.2)
Total Protein: 8.7 g/dL — ABNORMAL HIGH (ref 6.5–8.1)
Total Protein: 9.3 g/dL — ABNORMAL HIGH (ref 6.5–8.1)

## 2019-07-18 LAB — RESPIRATORY PANEL BY RT PCR (FLU A&B, COVID)
Influenza A by PCR: NEGATIVE
Influenza B by PCR: NEGATIVE
SARS Coronavirus 2 by RT PCR: POSITIVE — AB

## 2019-07-18 LAB — CBC WITH DIFFERENTIAL/PLATELET
Abs Immature Granulocytes: 0.03 10*3/uL (ref 0.00–0.07)
Basophils Absolute: 0 10*3/uL (ref 0.0–0.1)
Basophils Relative: 1 %
Eosinophils Absolute: 0.3 10*3/uL (ref 0.0–0.5)
Eosinophils Relative: 3 %
HCT: 33.8 % — ABNORMAL LOW (ref 39.0–52.0)
Hemoglobin: 11.1 g/dL — ABNORMAL LOW (ref 13.0–17.0)
Immature Granulocytes: 0 %
Lymphocytes Relative: 17 %
Lymphs Abs: 1.5 10*3/uL (ref 0.7–4.0)
MCH: 31.3 pg (ref 26.0–34.0)
MCHC: 32.8 g/dL (ref 30.0–36.0)
MCV: 95.2 fL (ref 80.0–100.0)
Monocytes Absolute: 0.9 10*3/uL (ref 0.1–1.0)
Monocytes Relative: 10 %
Neutro Abs: 6.1 10*3/uL (ref 1.7–7.7)
Neutrophils Relative %: 69 %
Platelets: 177 10*3/uL (ref 150–400)
RBC: 3.55 MIL/uL — ABNORMAL LOW (ref 4.22–5.81)
RDW: 13.8 % (ref 11.5–15.5)
WBC: 8.9 10*3/uL (ref 4.0–10.5)
nRBC: 0 % (ref 0.0–0.2)

## 2019-07-18 LAB — TROPONIN I (HIGH SENSITIVITY)
Troponin I (High Sensitivity): 53 ng/L — ABNORMAL HIGH (ref <18)
Troponin I (High Sensitivity): 51 ng/L — ABNORMAL HIGH (ref <18)
Troponin I (High Sensitivity): 55 ng/L — ABNORMAL HIGH (ref <18)
Troponin I (High Sensitivity): 70 ng/L — ABNORMAL HIGH (ref <18)
Troponin I (High Sensitivity): 87 ng/L — ABNORMAL HIGH (ref <18)

## 2019-07-18 LAB — CBC
HCT: 36 % — ABNORMAL LOW (ref 39.0–52.0)
Hemoglobin: 12.1 g/dL — ABNORMAL LOW (ref 13.0–17.0)
MCH: 30.9 pg (ref 26.0–34.0)
MCHC: 33.6 g/dL (ref 30.0–36.0)
MCV: 91.8 fL (ref 80.0–100.0)
Platelets: 194 10*3/uL (ref 150–400)
RBC: 3.92 MIL/uL — ABNORMAL LOW (ref 4.22–5.81)
RDW: 14 % (ref 11.5–15.5)
WBC: 11.4 10*3/uL — ABNORMAL HIGH (ref 4.0–10.5)
nRBC: 0 % (ref 0.0–0.2)

## 2019-07-18 LAB — FERRITIN: Ferritin: 1009 ng/mL — ABNORMAL HIGH (ref 24–336)

## 2019-07-18 LAB — GLUCOSE, CAPILLARY
Glucose-Capillary: 166 mg/dL — ABNORMAL HIGH (ref 70–99)
Glucose-Capillary: 124 mg/dL — ABNORMAL HIGH (ref 70–99)
Glucose-Capillary: 113 mg/dL — ABNORMAL HIGH (ref 70–99)
Glucose-Capillary: 134 mg/dL — ABNORMAL HIGH (ref 70–99)
Glucose-Capillary: 122 mg/dL — ABNORMAL HIGH (ref 70–99)

## 2019-07-18 LAB — APTT: aPTT: 28 seconds (ref 24–36)

## 2019-07-18 LAB — HEPARIN LEVEL (UNFRACTIONATED): Heparin Unfractionated: <0.1 IU/mL — ABNORMAL LOW (ref 0.30–0.70)

## 2019-07-18 LAB — FIBRIN DERIVATIVES D-DIMER (ARMC ONLY): Fibrin derivatives D-dimer (ARMC): 1433.53 ng/mL (FEU) — ABNORMAL HIGH (ref 0.00–499.00)

## 2019-07-18 LAB — ABO/RH: ABO/RH(D): B POS

## 2019-07-18 LAB — FIBRINOGEN: Fibrinogen: 392 mg/dL (ref 210–475)

## 2019-07-18 LAB — PROTIME-INR
INR: 1.1 (ref 0.8–1.2)
Prothrombin Time: 14.2 seconds (ref 11.4–15.2)

## 2019-07-18 LAB — BRAIN NATRIURETIC PEPTIDE: B Natriuretic Peptide: 902 pg/mL — ABNORMAL HIGH (ref 0.0–100.0)

## 2019-07-18 LAB — C-REACTIVE PROTEIN: CRP: 0.9 mg/dL (ref <1.0)

## 2019-07-18 LAB — PROCALCITONIN: Procalcitonin: 1.32 ng/mL

## 2019-07-18 LAB — LACTATE DEHYDROGENASE: LDH: 274 U/L — ABNORMAL HIGH (ref 98–192)

## 2019-07-18 MED ORDER — ACETAMINOPHEN 325 MG PO TABS: 650.0000 mg | ORAL_TABLET | ORAL | Status: DC | PRN

## 2019-07-18 MED ORDER — ALBUTEROL SULFATE (2.5 MG/3ML) 0.083% IN NEBU
2.5000 mg | INHALATION_SOLUTION | Freq: Once | RESPIRATORY_TRACT | Status: AC
  Administered 2019-07-18: 2.5 mg via RESPIRATORY_TRACT
  Filled 2019-07-18: qty 3

## 2019-07-18 MED ORDER — NICARDIPINE HCL IN NACL 20-0.86 MG/200ML-% IV SOLN
3.0000 mg/h | INTRAVENOUS | Status: DC
  Administered 2019-07-18: 10 mg/h via INTRAVENOUS
  Administered 2019-07-18: 5 mg/h via INTRAVENOUS
  Administered 2019-07-18: 10 mg/h via INTRAVENOUS
  Administered 2019-07-18: 3 mg/h via INTRAVENOUS
  Filled 2019-07-18 (×5): qty 200

## 2019-07-18 MED ORDER — FAMOTIDINE 20 MG PO TABS
20.0000 mg | ORAL_TABLET | ORAL | Status: DC
  Administered 2019-07-21: 20 mg via ORAL
  Filled 2019-07-18 (×2): qty 1

## 2019-07-18 MED ORDER — INSULIN ASPART 100 UNIT/ML ~~LOC~~ SOLN
0.0000 [IU] | Freq: Every day | SUBCUTANEOUS | Status: DC
  Administered 2019-07-21: 2 [IU] via SUBCUTANEOUS
  Filled 2019-07-18 (×2): qty 1

## 2019-07-18 MED ORDER — OXYCODONE-ACETAMINOPHEN 5-325 MG PO TABS
1.0000 | ORAL_TABLET | Freq: Four times a day (QID) | ORAL | Status: DC | PRN
  Administered 2019-07-18 – 2019-07-19 (×2): 1 via ORAL
  Filled 2019-07-18 (×3): qty 1

## 2019-07-18 MED ORDER — VITAMIN B-1 100 MG PO TABS
100.0000 mg | ORAL_TABLET | Freq: Every day | ORAL | Status: DC
  Administered 2019-07-18 – 2019-07-21 (×4): 100 mg via ORAL
  Filled 2019-07-18 (×4): qty 1

## 2019-07-18 MED ORDER — NITROGLYCERIN IN D5W 200-5 MCG/ML-% IV SOLN
0.0000 ug/min | INTRAVENOUS | Status: DC
  Administered 2019-07-18: 5 ug/min via INTRAVENOUS
  Filled 2019-07-18: qty 250

## 2019-07-18 MED ORDER — CALCIUM CARBONATE ANTACID 500 MG PO CHEW: 1.0000 | CHEWABLE_TABLET | Freq: Every evening | ORAL | Status: DC | PRN

## 2019-07-18 MED ORDER — AMLODIPINE BESYLATE 10 MG PO TABS
10.0000 mg | ORAL_TABLET | Freq: Every day | ORAL | Status: DC
  Administered 2019-07-18 – 2019-07-21 (×4): 10 mg via ORAL
  Filled 2019-07-18 (×4): qty 1

## 2019-07-18 MED ORDER — SODIUM BICARBONATE 8.4 % IV SOLN
50.0000 meq | Freq: Once | INTRAVENOUS | Status: AC
  Administered 2019-07-18: 50 meq via INTRAVENOUS
  Filled 2019-07-18: qty 50

## 2019-07-18 MED ORDER — SODIUM CHLORIDE 0.9 % IV SOLN
200.0000 mg | Freq: Once | INTRAVENOUS | Status: AC
  Administered 2019-07-18: 200 mg via INTRAVENOUS
  Filled 2019-07-18: qty 200

## 2019-07-18 MED ORDER — VITAMIN C 500 MG PO TABS
500.0000 mg | ORAL_TABLET | Freq: Every day | ORAL | Status: DC
  Administered 2019-07-18 – 2019-07-21 (×4): 500 mg via ORAL
  Filled 2019-07-18 (×4): qty 1

## 2019-07-18 MED ORDER — HEPARIN BOLUS VIA INFUSION
4000.0000 [IU] | Freq: Once | INTRAVENOUS | Status: AC
  Administered 2019-07-18: 4000 [IU] via INTRAVENOUS
  Filled 2019-07-18: qty 4000

## 2019-07-18 MED ORDER — ALBUTEROL SULFATE HFA 108 (90 BASE) MCG/ACT IN AERS
1.0000 | INHALATION_SPRAY | Freq: Four times a day (QID) | RESPIRATORY_TRACT | Status: DC | PRN
  Filled 2019-07-18 (×2): qty 6.7

## 2019-07-18 MED ORDER — FUROSEMIDE 10 MG/ML IJ SOLN
60.0000 mg | Freq: Once | INTRAMUSCULAR | Status: AC
  Administered 2019-07-18: 60 mg via INTRAVENOUS

## 2019-07-18 MED ORDER — GABAPENTIN 300 MG PO CAPS
300.0000 mg | ORAL_CAPSULE | Freq: Three times a day (TID) | ORAL | Status: DC
  Administered 2019-07-18: 300 mg via ORAL
  Filled 2019-07-18: qty 1

## 2019-07-18 MED ORDER — CALCIUM ACETATE (PHOS BINDER) 667 MG PO CAPS
1334.0000 mg | ORAL_CAPSULE | Freq: Three times a day (TID) | ORAL | Status: DC
  Administered 2019-07-18 – 2019-07-21 (×7): 1334 mg via ORAL
  Filled 2019-07-18 (×15): qty 2

## 2019-07-18 MED ORDER — RENA-VITE PO TABS: 1.0000 | ORAL_TABLET | Freq: Every day | ORAL | Status: DC

## 2019-07-18 MED ORDER — LORAZEPAM 2 MG/ML IJ SOLN
0.5000 mg | Freq: Once | INTRAMUSCULAR | Status: AC
  Administered 2019-07-18: 0.5 mg via INTRAVENOUS
  Filled 2019-07-18: qty 1

## 2019-07-18 MED ORDER — FOLIC ACID 1 MG PO TABS
1.0000 mg | ORAL_TABLET | Freq: Every day | ORAL | Status: DC
  Administered 2019-07-18 – 2019-07-21 (×4): 1 mg via ORAL
  Filled 2019-07-18 (×4): qty 1

## 2019-07-18 MED ORDER — SODIUM ZIRCONIUM CYCLOSILICATE 10 G PO PACK
10.0000 g | PACK | Freq: Once | ORAL | Status: DC
  Filled 2019-07-18: qty 1

## 2019-07-18 MED ORDER — ACETAMINOPHEN 325 MG PO TABS: 325.0000 mg | ORAL_TABLET | ORAL | Status: DC | PRN

## 2019-07-18 MED ORDER — ZINC SULFATE 220 (50 ZN) MG PO CAPS
220.0000 mg | ORAL_CAPSULE | Freq: Every day | ORAL | Status: DC
  Administered 2019-07-18 – 2019-07-21 (×4): 220 mg via ORAL
  Filled 2019-07-18 (×4): qty 1

## 2019-07-18 MED ORDER — HEPARIN (PORCINE) 25000 UT/250ML-% IV SOLN
1400.0000 [IU]/h | INTRAVENOUS | Status: DC
  Administered 2019-07-18: 1400 [IU]/h via INTRAVENOUS
  Filled 2019-07-18: qty 250

## 2019-07-18 MED ORDER — FAMOTIDINE 20 MG PO TABS
20.0000 mg | ORAL_TABLET | Freq: Every day | ORAL | Status: DC
  Administered 2019-07-18: 20 mg via ORAL
  Filled 2019-07-18: qty 1

## 2019-07-18 MED ORDER — LISINOPRIL 20 MG PO TABS: 40.0000 mg | ORAL_TABLET | Freq: Every day | ORAL | Status: DC

## 2019-07-18 MED ORDER — FUROSEMIDE 10 MG/ML IJ SOLN
60.0000 mg | Freq: Once | INTRAMUSCULAR | Status: AC
  Administered 2019-07-18: 60 mg via INTRAVENOUS
  Filled 2019-07-18: qty 8

## 2019-07-18 MED ORDER — SODIUM CHLORIDE 0.9 % IV SOLN
1.0000 g | Freq: Once | INTRAVENOUS | Status: DC
  Filled 2019-07-18: qty 10

## 2019-07-18 MED ORDER — SODIUM CHLORIDE 0.9 % IV SOLN
100.0000 mg | Freq: Every day | INTRAVENOUS | Status: DC
  Administered 2019-07-19: 100 mg via INTRAVENOUS
  Filled 2019-07-18: qty 100
  Filled 2019-07-18: qty 20

## 2019-07-18 MED ORDER — ROPINIROLE HCL 1 MG PO TABS
1.0000 mg | ORAL_TABLET | Freq: Three times a day (TID) | ORAL | Status: DC
  Administered 2019-07-18 – 2019-07-21 (×10): 1 mg via ORAL
  Filled 2019-07-18 (×13): qty 1

## 2019-07-18 MED ORDER — ASPIRIN EC 81 MG PO TBEC
81.0000 mg | DELAYED_RELEASE_TABLET | Freq: Every day | ORAL | Status: DC
  Administered 2019-07-18 – 2019-07-21 (×4): 81 mg via ORAL
  Filled 2019-07-18 (×4): qty 1

## 2019-07-18 MED ORDER — CALCIUM GLUCONATE-NACL 1-0.675 GM/50ML-% IV SOLN
1.0000 g | Freq: Once | INTRAVENOUS | Status: AC
  Administered 2019-07-18: 1000 mg via INTRAVENOUS
  Filled 2019-07-18 (×2): qty 50

## 2019-07-18 MED ORDER — INSULIN ASPART 100 UNIT/ML ~~LOC~~ SOLN
0.0000 [IU] | Freq: Three times a day (TID) | SUBCUTANEOUS | Status: DC
  Administered 2019-07-18 (×3): 1 [IU] via SUBCUTANEOUS
  Administered 2019-07-19: 2 [IU] via SUBCUTANEOUS
  Administered 2019-07-19 – 2019-07-20 (×3): 1 [IU] via SUBCUTANEOUS
  Administered 2019-07-21 (×2): 2 [IU] via SUBCUTANEOUS
  Administered 2019-07-21: 1 [IU] via SUBCUTANEOUS
  Filled 2019-07-18 (×9): qty 1

## 2019-07-18 MED ORDER — DEXTROSE 50 % IV SOLN
1.0000 | Freq: Once | INTRAVENOUS | Status: AC
  Administered 2019-07-18: 50 mL via INTRAVENOUS
  Filled 2019-07-18: qty 50

## 2019-07-18 MED ORDER — INSULIN ASPART 100 UNIT/ML ~~LOC~~ SOLN
10.0000 [IU] | Freq: Once | SUBCUTANEOUS | Status: AC
  Administered 2019-07-18: 10 [IU] via SUBCUTANEOUS
  Filled 2019-07-18: qty 1

## 2019-07-18 MED ORDER — HEPARIN SODIUM (PORCINE) 10000 UNIT/ML IJ SOLN
10000.0000 [IU] | Freq: Three times a day (TID) | INTRAMUSCULAR | Status: DC
  Administered 2019-07-18 – 2019-07-19 (×2): 10000 [IU] via SUBCUTANEOUS
  Filled 2019-07-18 (×3): qty 1

## 2019-07-18 MED ORDER — METOPROLOL SUCCINATE ER 50 MG PO TB24
50.0000 mg | ORAL_TABLET | Freq: Every day | ORAL | Status: DC
  Administered 2019-07-18 – 2019-07-21 (×4): 50 mg via ORAL
  Filled 2019-07-18 (×4): qty 1

## 2019-07-18 MED ORDER — DEXAMETHASONE 4 MG PO TABS
6.0000 mg | ORAL_TABLET | ORAL | Status: DC
  Administered 2019-07-19 – 2019-07-21 (×3): 6 mg via ORAL
  Filled 2019-07-18 (×6): qty 1.5

## 2019-07-18 MED ORDER — ONDANSETRON HCL 4 MG/2ML IJ SOLN
4.0000 mg | Freq: Four times a day (QID) | INTRAMUSCULAR | Status: DC | PRN
  Administered 2019-07-20: 4 mg via INTRAVENOUS
  Filled 2019-07-18: qty 2

## 2019-07-18 MED ORDER — ONDANSETRON HCL 4 MG/2ML IJ SOLN
4.0000 mg | Freq: Once | INTRAMUSCULAR | Status: AC
  Administered 2019-07-18: 4 mg via INTRAVENOUS

## 2019-07-18 MED ORDER — HEPARIN SODIUM (PORCINE) 5000 UNIT/ML IJ SOLN: 5000.0000 [IU] | Freq: Three times a day (TID) | INTRAMUSCULAR | Status: DC

## 2019-07-18 MED ORDER — CHLORHEXIDINE GLUCONATE CLOTH 2 % EX PADS
6.0000 | MEDICATED_PAD | Freq: Every day | CUTANEOUS | Status: DC
  Administered 2019-07-18 – 2019-07-21 (×3): 6 via TOPICAL
  Filled 2019-07-18: qty 6

## 2019-07-18 MED ORDER — ADULT MULTIVITAMIN W/MINERALS CH: 1.0000 | ORAL_TABLET | Freq: Every day | ORAL | Status: DC

## 2019-07-18 NOTE — ED Notes (Signed)
Pt could not tolerate venturi mask, placed on 5L [place]

## 2019-07-18 NOTE — ED Notes (Signed)
Respiratory called to put pt on bipap at this time

## 2019-07-18 NOTE — ED Notes (Signed)
Pt could not tolerate bipap. Placed on venturi mask

## 2019-07-18 NOTE — H&P (Signed)
Name: Donald Silva MRN: [id number] DOB: Dec 13, 1955    ADMISSION DATE:  07/18/2019 CONSULTATION DATE: 07/18/2019  REFERRING MD : Dr. Beather Arbour   CHIEF COMPLAINT: Shortness of Breath   BRIEF PATIENT DESCRIPTION:  63 yo male admitted with hypertensive urgency and acute on chronic hypoxic respiratory failure secondary to pulmonary edema in the setting of ESRD and COVID-19 requiring emergent hemodialysis and nicardipine gtt   SIGNIFICANT EVENTS/STUDIES:  12/8: Pt admitted to ICU with hypertensive urgency requiring nicardipine gtt and acute on chronic hypoxic respiratory failure secondary to COVID-19 and pulmonary edema in setting of ESRD 12/8: Pt underwent emergent hemodialysis   HISTORY OF PRESENT ILLNESS:   This is a 63 yo male with PMH of MI, HTN, GERD, Dyspnea, Type II Diabetes Mellitus, ESRD on HD (T-Th-SAT), Hyperkalemia, and Anemia of Chronic Kidney Disease.  He presented to [facility] ER on 12/8 from home with c/o shortness of breath onset of symptoms the night of 12/7.  His most recent HD session was 07/15/19.  Per ER notes pt reported he has had increased po fluid intake over the past few days.  Upon arrival to the ER pt had increased work of breathing and initially placed on Bipap. However, he was unable to tolerate Bipap or venturi mask requiring transition to 5L of O2 via nasal canula.  Lab results revealed K+ 6.6, creatinine 18.77, BUN 99, anion gap 18, AST 67, ALT 80, BNP 902, troponin 53, and hgb 11.1.  CXR concerning for pulmonary edema and/or atypical infection. Influenza PCR negative, however rapid COVID-19 positive.  Pt also hypertensive sbp 200's/100's. Nephrology consulted recommended nicardipine gtt and emergent hemodialysis ordered.  Pt subsequently admitted to ICU for additional workup and treatment   PAST MEDICAL HISTORY :   has a past medical history of CKD (chronic kidney disease) stage 5, GFR less than 15 ml/min ([place]), Diabetes mellitus without complication ([place]), Dyspnea, GERD  (gastroesophageal reflux disease), Hypertension, Lower extremity edema, and Myocardial infarction ([place]).  has a past surgical history that includes Cholecystectomy; Toe Surgery (Right); AV fistula placement (Left, 08/27/2017); and A/V Fistulagram (N/A, 05/03/2018). Prior to Admission medications   Medication Sig Start Date End Date Taking? Authorizing Provider  acetaminophen (MAPAP) 325 MG tablet Take 325 mg by mouth every 4 (four) hours as needed for mild pain.  01/31/19 01/30/20  [provider]  albuterol (PROVENTIL HFA;VENTOLIN HFA) 108 (90 Base) MCG/ACT inhaler Inhale 1-2 puffs into the lungs every 6 (six) hours as needed for wheezing or shortness of breath.  08/17/18 08/17/19  [provider]  amLODipine (NORVASC) 10 MG tablet Take 10 mg by mouth daily.  09/02/18   [provider]  aspirin EC 81 MG tablet Take 81 mg daily by mouth.    [provider]  AURYXIA 1 GM 210 MG(Fe) tablet Take 210 mg by mouth 3 (three) times daily with meals.  05/20/18   [provider]  b complex-vitamin c-folic acid (NEPHRO-VITE) 0.8 MG TABS tablet Take 1 tablet by mouth every Monday, Wednesday, and Friday. After dialysis treatment 03/11/18   [provider]  calcium acetate (PHOSLO) 667 MG capsule Take 1,334 mg by mouth 3 (three) times daily with meals.  01/28/18   [provider]  calcium carbonate (TUMS EX) 750 MG chewable tablet Chew 2 tablets by mouth at bedtime.    [provider]  gabapentin (NEURONTIN) 300 MG capsule Take 300 mg by mouth 3 (three) times daily.  07/22/18   [provider]  heparin 1000  UNIT/ML injection Inject 8,000 Units into the vein every dialysis.  01/17/19 01/16/20  [provider]  Insulin Detemir (LEVEMIR FLEXTOUCH) 100 UNIT/ML Pen Inject 66 Units into the skin at bedtime.  06/01/16   [provider]  lanthanum (FOSRENOL) 1000 MG chewable tablet Chew 3,000 mg by mouth 3 (three) times daily with meals.   05/30/18   [provider]  lisinopril (ZESTRIL) 40 MG tablet Take 40 mg by mouth daily.  02/05/18   [provider]  metoprolol succinate (TOPROL-XL) 50 MG 24 hr tablet Take 50 mg by mouth daily.  09/12/18   [provider]  oxyCODONE-acetaminophen (PERCOCET) 5-325 MG tablet Take 1 tablet by mouth every 6 (six) hours as needed for severe pain. 06/20/19   Edrick Kins, DPM  rOPINIRole (REQUIP) 1 MG tablet Take 1 mg by mouth 3 (three) times daily.  03/23/19   [provider]   Allergies  Allergen Reactions   Penicillins Anaphylaxis    Was told it could kill him, pt unsure of reaction  Has patient had a PCN reaction causing immediate rash, facial/tongue/throat swelling, SOB or lightheadedness with hypotension: No Has patient had a PCN reaction causing severe rash involving mucus membranes or skin necrosis: No Has patient had a PCN reaction that required hospitalization: No Has patient had a PCN reaction occurring within the last 10 years: No If all of the above answers are "NO", then may proceed with Cephalosporin use.    Cyclopentolate Other (See Comments)    FAMILY HISTORY:  family history includes Diabetes in his father and sister; Hypertension in his sister. SOCIAL HISTORY:  reports that he has never smoked. He has never used smokeless tobacco. He reports that he does not drink alcohol or use drugs.  REVIEW OF SYSTEMS: Positives in BOLD  Constitutional: Negative for fever, chills, weight loss, malaise/fatigue and diaphoresis.  HENT: Negative for hearing loss, ear pain, nosebleeds, congestion, sore throat, neck pain, tinnitus and ear discharge.   Eyes: Negative for blurred vision, double vision, photophobia, pain, discharge and redness.  Respiratory: cough, hemoptysis, sputum production, shortness of breath, wheezing and stridor.   Cardiovascular: Negative for chest pain, palpitations, orthopnea, claudication, leg swelling and PND.  Gastrointestinal:  Negative for heartburn, nausea, vomiting, abdominal pain, diarrhea, constipation, blood in stool and melena.  Genitourinary: Negative for dysuria, urgency, frequency, hematuria and flank pain.  Musculoskeletal: Negative for myalgias, back pain, joint pain and falls.  Skin: Negative for itching and rash.  Neurological: Negative for dizziness, tingling, tremors, sensory change, speech change, focal weakness, seizures, loss of consciousness, weakness and headaches.  Endo/Heme/Allergies: Negative for environmental allergies and polydipsia. Does not bruise/bleed easily.  SUBJECTIVE:  c/o shortness of breathing and requesting something to drink  VITAL SIGNS: Temp:  [99 F (37.2 C)] 99 F (37.2 C) (12/08 0034) Pulse Rate:  [88] 88 (12/08 0034) Resp:  [20-30] 30 (12/08 0230) BP: (231-262)/(107-115) 262/107 (12/08 0230) SpO2:  [88 %-98 %] 98 % (12/08 0107) Weight:  [120.2 kg] 120.2 kg (12/08 0035)  PHYSICAL EXAMINATION: General: acutely ill appearing male, in mild respiratory distress  Neuro: alert and oriented, follows commands  HEENT: JVD present Cardiovascular: nsr. Rrr, no R/G  Lungs: diffuse crackles with inspiratory/expiratory wheezes throughout, tachypneic  Abdomen: +BS x4, obese, soft, non tender, non distended  Musculoskeletal: 1+ bilateral lower extremity edema, right great toe amputation  Skin: left upper extremity av fistula +bruit and thrill, ulceration with minimal drainage present at right great toe amputation site   Recent  Labs  Lab 07/18/19 0049  NA 138  K 6.6*  CL 98  CO2 22  BUN 99*  CREATININE 18.77*  GLUCOSE 140*   Recent Labs  Lab 07/18/19 0049  HGB 11.1*  HCT 33.8*  WBC 8.9  PLT 177   Dg Chest Port 1 View  Result Date: 07/18/2019 CLINICAL DATA:  Shortness of breath EXAM: PORTABLE CHEST 1 VIEW COMPARISON:  June 14, 2019 FINDINGS: Heart size is enlarged. There are worsening diffuse bilateral interstitial airspace opacities with more hazy focal  opacities at the lung bases bilaterally. There is no pneumothorax. No large pleural effusion. There is no acute osseous abnormality. There is no pneumothorax. IMPRESSION: Overall findings can be seen in patients with congestive heart failure or an atypical infectious process in the appropriate clinical setting. Electronically Signed   By: Constance Holster M.D.   On: 07/18/2019 01:22    ASSESSMENT / PLAN:  Acute on chronic hypoxic respiratory failure secondary to COVID-19 and pulmonary edema in the setting of ESRD  Supplemental O2 for dyspnea and/or hypoxia  Prn bronchodilator therapy  Continue remdesivir, decadron, and mvi's Encourage to self-prone as tolerated  OOB to chair once respiratory status improves  Pulmonary hygiene Trend WBC and monitor fever curve Trend PCT  Follow cultures  Maintain airborne and contact precautions   Hypertensive urgency Elevated troponin likely demand ischemia in setting of acute respiratory failure  Hx: MI  Continuous telemetry monitoring  Trend troponin's Hold outpatient lisinopril for now, continue other outpatient cardiac medications  Prn nicardipine gtt to maintain sbp 170-140  ESRD on HD Hyperkalemia  Trend BMP  Replace electrolytes as indicated  Monitor UOP Avoid nephrotoxic medications  Nephrology consulted appreciate input-emergent hemodialysis pending  IV lasix as tolerated   Elevated liver enzymes  Trend hepatic panel   Anemia of chronic kidney disease  Trend CBC  Monitor for s/sx of bleeding and transfuse for hgb <7  Type II diabetes mellitus  CBG's ac/hs SSI   Best practice:  VTE px: heparin gtt dosing per pharmacy  SUP px: po famotidine  Diet: renal diet with fluid restrictions    Marda Stalker, [place] Pager [phone] (please enter 7 digits) PCCM Consult Pager [phone] (please enter 7 digits)

## 2019-07-18 NOTE — ED Provider Notes (Signed)
[facility] Emergency Department Provider Note   ____________________________________________   None    (approximate)  I have reviewed the triage vital signs and the nursing notes.   HISTORY  Chief Complaint Shortness of Breath    HPI Donald Silva is a 63 y.o. male who presents to the ED from home with a chief complaint of shortness of breath.  Patient has a history of ESRD on HD TTS.  States he is fully dialyzed on Saturday.  Makes some urine daily.  Presents with progressive shortness of breath throughout the day associated with nausea.  Room air saturations 88%.  Denies fever, cough, chest pain, abdominal pain, vomiting.       Past Medical History:  Diagnosis Date   CKD (chronic kidney disease) stage 5, GFR less than 15 ml/min (HCC)    Diabetes mellitus without complication (HCC)    Dyspnea    GERD (gastroesophageal reflux disease)    Hypertension    Lower extremity edema    Myocardial infarction [facility])     Patient Active Problem List   Diagnosis Date Noted   Acute and chronic respiratory failure (acute-on-chronic) ([place]) 07/18/2019   COVID-19 virus infection 07/18/2019   Acute respiratory failure ([place]) 06/13/2019   Flash pulmonary edema (HCC)    ESRD (end stage renal disease) ([place])    Acute CHF (congestive heart failure) ([place]) 05/22/2019   Postop check 03/02/2019   Diabetes 1.5, managed as type 2 ([place]) 29/56/2130   Complication of vascular access for dialysis 04/20/2018   Chronic kidney disease, stage V ([place]) 08/11/2017   Essential hypertension 08/11/2017   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[place]) 08/11/2017   Hyperkalemia 06/24/2017   Multiple-type hyperlipidemia 03/25/2012   Vitamin D deficiency 03/25/2012   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 Authorizing Provider  acetaminophen (MAPAP) 325 MG tablet Take 325 mg by mouth every 4 (four) hours as needed for mild pain.  01/31/19 01/30/20  [provider]  albuterol (PROVENTIL HFA;VENTOLIN HFA) 108 (90 Base) MCG/ACT inhaler Inhale 1-2 puffs into the lungs every 6 (six) hours as needed for wheezing or shortness of breath.  08/17/18 08/17/19  [provider]  amLODipine (NORVASC) 10 MG tablet Take 10 mg by mouth daily.  09/02/18   [provider]  aspirin EC 81 MG tablet Take 81 mg daily by mouth.    [provider]  AURYXIA 1 GM 210 MG(Fe) tablet Take 210 mg by mouth 3 (three) times daily with meals.  05/20/18   [provider]  b complex-vitamin c-folic acid (NEPHRO-VITE) 0.8 MG TABS tablet Take 1 tablet by mouth every Monday, Wednesday, and Friday. After dialysis treatment 03/11/18   [provider]  calcium acetate (PHOSLO) 667 MG capsule Take 1,334 mg by mouth 3 (three) times daily with meals.  01/28/18   [provider]  calcium carbonate (TUMS EX) 750 MG  chewable tablet Chew 2 tablets by mouth at bedtime.    [provider]  gabapentin (NEURONTIN) 300 MG capsule Take 300 mg by mouth 3 (three) times daily.  07/22/18   [provider]  heparin 1000 UNIT/ML injection Inject 8,000 Units into the vein every dialysis.  01/17/19 01/16/20  [provider]  Insulin Detemir (LEVEMIR FLEXTOUCH) 100 UNIT/ML Pen Inject 66 Units into the skin at bedtime.  06/01/16   [provider]  lanthanum  (FOSRENOL) 1000 MG chewable tablet Chew 3,000 mg by mouth 3 (three) times daily with meals.  05/30/18   [provider]  lisinopril (ZESTRIL) 40 MG tablet Take 40 mg by mouth daily.  02/05/18   [provider]  metoprolol succinate (TOPROL-XL) 50 MG 24 hr tablet Take 50 mg by mouth daily.  09/12/18   [provider]  oxyCODONE-acetaminophen (PERCOCET) 5-325 MG tablet Take 1 tablet by mouth every 6 (six) hours as needed for severe pain. 06/20/19   Edrick Kins, DPM  rOPINIRole (REQUIP) 1 MG tablet Take 1 mg by mouth 3 (three) times daily.  03/23/19   [provider]    Allergies Penicillins and Cyclopentolate  Family History  Problem Relation Age of Onset   Hypertension Sister    Diabetes Sister    Diabetes Father     Social History Social History   Tobacco Use   Smoking status: Never Smoker   Smokeless tobacco: Never Used  Substance Use Topics   Alcohol use: No   Drug use: No    Review of Systems  Constitutional: No fever/chills Eyes: No visual changes. ENT: No sore throat. Cardiovascular: Denies chest pain. Respiratory: Positive for shortness of breath. Gastrointestinal: No abdominal pain.  Positive for nausea, no vomiting.  No diarrhea.  No constipation. Genitourinary: Negative for dysuria. Musculoskeletal: Negative for back pain. Skin: Negative for rash. Neurological: Negative for headaches, focal weakness or numbness.   ____________________________________________   PHYSICAL EXAM:  VITAL SIGNS: ED Triage Vitals  Enc Vitals Group     BP 07/18/19 0034 (!) 231/115     Pulse Rate 07/18/19 0034 88     Resp 07/18/19 0034 20     Temp 07/18/19 0034 99 F (37.2 C)     Temp Source 07/18/19 0034 Oral     SpO2 07/18/19 0034 (!) 88 %     Weight 07/18/19 0035 265 lb (120.2 kg)     Height 07/18/19 0035 5\' 11"  (1.803 m)     Head Circumference --      Peak Flow --      Pain Score 07/18/19 0034 0     Pain Loc --      Pain Edu?  --      Excl. in [place]? --     Constitutional: Alert and oriented.  Ill appearing and in moderate acute distress. Eyes: Conjunctivae are normal. PERRL. EOMI. Head: Atraumatic. Nose: No congestion/rhinnorhea. Mouth/Throat: Mucous membranes are moist.  Oropharynx non-erythematous. Neck: No stridor.   Cardiovascular: Normal rate, regular rhythm. Grossly normal heart sounds.  Good peripheral circulation. Respiratory: Increased respiratory effort.  No retractions. Lungs with audible rales. Gastrointestinal: Soft and nontender. No distention. No abdominal bruits. No CVA tenderness. Musculoskeletal: No lower extremity tenderness.  2+ BLE nonpitting edema.  No joint effusions. Neurologic:  Normal speech and language. No gross focal neurologic deficits are appreciated.  Skin:  Skin is warm, dry and intact. No rash noted. Psychiatric: Mood and affect are normal. Speech and behavior are normal.  ____________________________________________   LABS (all labs ordered are listed, but only abnormal results are displayed)  Labs Reviewed  RESPIRATORY PANEL BY RT PCR (FLU A&B, COVID) - Abnormal; Notable for the following components:      Result Value   SARS Coronavirus 2 by RT PCR POSITIVE (*)    All other components within normal limits  CBC WITH DIFFERENTIAL/PLATELET - Abnormal; Notable for the following components:   RBC 3.55 (*)    Hemoglobin 11.1 (*)    HCT 33.8 (*)    All other components within normal limits  COMPREHENSIVE METABOLIC PANEL - Abnormal; Notable for the following components:   Potassium 6.6 (*)    Glucose, Bld 140 (*)    BUN 99 (*)    Creatinine, Ser 18.77 (*)    Calcium 7.5 (*)    Total Protein 8.7 (*)    AST 67 (*)    ALT 80 (*)    GFR calc non Af Amer 2 (*)    GFR calc Af Amer 3 (*)    Anion gap 18 (*)    All other components within normal limits  BRAIN NATRIURETIC PEPTIDE - Abnormal; Notable for the following components:   B Natriuretic Peptide 902.0 (*)    All other  components within normal limits  BLOOD GAS, ARTERIAL - Abnormal; Notable for the following components:   pH, Arterial 7.23 (*)    pCO2 arterial 53 (*)    pO2, Arterial 60 (*)    Acid-base deficit 5.8 (*)    All other components within normal limits  COMPREHENSIVE METABOLIC PANEL - Abnormal; Notable for the following components:   Potassium 6.1 (*)    Chloride 96 (*)    CO2 21 (*)    Glucose, Bld 214 (*)    BUN 106 (*)    Creatinine, Ser 18.67 (*)    Calcium 7.3 (*)    Total Protein 9.3 (*)    AST 64 (*)    ALT 82 (*)    Alkaline Phosphatase 128 (*)    GFR calc non Af Amer 2 (*)    GFR calc Af Amer 3 (*)    Anion gap 19 (*)    All other components within normal limits  CBC - Abnormal; Notable for the following components:   WBC 11.4 (*)    RBC 3.92 (*)    Hemoglobin 12.1 (*)    HCT 36.0 (*)    All other components within normal limits  GLUCOSE, CAPILLARY - Abnormal; Notable for the following components:   Glucose-Capillary 166 (*)    All other components within normal limits  FIBRIN DERIVATIVES D-DIMER ([facility] ONLY) - Abnormal; Notable for the following components:   Fibrin derivatives D-dimer [facility]) 1,433.53 (*)    All other components within normal limits  TROPONIN I (HIGH SENSITIVITY) - Abnormal; Notable for the following components:   Troponin I (High Sensitivity) 53 (*)    All other components within normal limits  TROPONIN I (HIGH SENSITIVITY) - Abnormal; Notable for the following components:   Troponin I (High Sensitivity) 51 (*)    All other components within normal limits  MRSA PCR SCREENING  CULTURE, BLOOD (ROUTINE X 2)  CULTURE, BLOOD (ROUTINE X 2)  APTT  PROTIME-INR  C-REACTIVE PROTEIN  FERRITIN  FIBRINOGEN  LACTATE DEHYDROGENASE  PROCALCITONIN  HEPARIN LEVEL (UNFRACTIONATED)  BASIC METABOLIC PANEL  HEPATITIS B SURFACE ANTIGEN  HEPATITIS B SURFACE ANTIBODY, QUANTITATIVE  POC SARS CORONAVIRUS 2 AG -  ED  ABO/RH  TROPONIN  I (HIGH SENSITIVITY)  TROPONIN  I (HIGH SENSITIVITY)   ____________________________________________  EKG  ED ECG REPORT I, Annarae Macnair J, the attending physician, personally viewed and interpreted this ECG.   Date: 07/18/2019  EKG Time: 0045  Rate: 87  Rhythm: normal EKG, normal sinus rhythm  Axis: LAD  Intervals:left anterior fascicular block  ST&T Change: Nonspecific QTC 509  ____________________________________________  RADIOLOGY  ED MD interpretation: Pulmonary edema  Official radiology report(s): Dg Chest Port 1 View  Result Date: 07/18/2019 CLINICAL DATA:  Shortness of breath EXAM: PORTABLE CHEST 1 VIEW COMPARISON:  June 14, 2019 FINDINGS: Heart size is enlarged. There are worsening diffuse bilateral interstitial airspace opacities with more hazy focal opacities at the lung bases bilaterally. There is no pneumothorax. No large pleural effusion. There is no acute osseous abnormality. There is no pneumothorax. IMPRESSION: Overall findings can be seen in patients with congestive heart failure or an atypical infectious process in the appropriate clinical setting. Electronically Signed   By: Constance Holster M.D.   On: 07/18/2019 01:22    ____________________________________________   PROCEDURES  Procedure(s) performed (including Critical Care):  Procedures  CRITICAL CARE Performed by: Paulette Blanch   Total critical care time: 60 minutes  Critical care time was exclusive of separately billable procedures and treating other patients.  Critical care was necessary to treat or prevent imminent or life-threatening deterioration.  Critical care was time spent personally by me on the following activities: development of treatment plan with patient and/or surrogate as well as nursing, discussions with consultants, evaluation of patient's response to treatment, examination of patient, obtaining history from patient or surrogate, ordering and performing treatments and interventions, ordering and review of  laboratory studies, ordering and review of radiographic studies, pulse oximetry and re-evaluation of patient's condition.  ____________________________________________   INITIAL IMPRESSION / ASSESSMENT AND PLAN / ED COURSE  As part of my medical decision making, I reviewed the following data within the [place] notes reviewed and incorporated, Labs reviewed, EKG interpreted, Old chart reviewed, Radiograph reviewed, Discussed with admitting physician and Notes from prior ED visits     Donald Silva was evaluated in Emergency Department on 07/18/2019 for the symptoms described in the history of present illness. He was evaluated in the context of the global COVID-19 pandemic, which necessitated consideration that the patient might be at risk for infection with the SARS-CoV-2 virus that causes COVID-19. Institutional protocols and algorithms that pertain to the evaluation of patients at risk for COVID-19 are in a state of rapid change based on information released by regulatory bodies including the CDC and federal and state organizations. These policies and algorithms were followed during the patient's care in the ED.    63 year old male with ESRD on HD TTS who presents with respiratory distress. Differential includes, but is not limited to, viral syndrome, bronchitis including COPD exacerbation, pneumonia, reactive airway disease including asthma, CHF including exacerbation with or without pulmonary/interstitial edema, pneumothorax, ACS, thoracic trauma, and pulmonary embolism.  Patient admitted just last month for pulmonary edema, hyperkalemia on nitroglycerin drip, BiPAP, IV Lasix and hyperkalemia cocktail.  Given his distress, will initiate BiPAP and give low-dose Ativan for his anxiety/reluctance to go on BiPAP along with 60 mg IV Lasix which is what he received last month.   Clinical Course as of Jul 18 535  Tue Jul 18, 2019  0139 Patient tolerating BiPAP poorly  despite low-dose Ativan.  I have discussed with him cthat if he does not  wear BiPAP and he fatigues then intubation would be the next step.  He verbalizes understanding of this.  Clinically he is looking improved during the short time he has been on BiPAP.  Will trial Ventimask to see if patient can tolerate.   [JS]  0155 K 6.6 per verbal report from lab.  Will give hyperkalemia cocktail as well as Lokelma which is what patient received last hospitalization.  Will discuss with nephrology for urgent dialysis.  Will discuss with hospitalist services for admission.    [JS]  0202 Discussed case with Dr. Candiss Norse from nephrology who recommends an additional 60 mg IV Lasix.  Will call dialysis nurse for urgent dialysis.  Start nitroglycerin drip for persistent hypertension.   [JS]  0211 Patient refused to wear Ventimask.  Currently on  4 L nasal cannula oxygen with saturations 85 to 90%.   [JS]  0229 Consult for hospitalist canceled as patient now requires ICU resources.  Discussed with CCU NP Hinton Dyer who has accepted patient for admission.   [JS]    Clinical Course User Index [JS] Paulette Blanch, MD     ____________________________________________   FINAL CLINICAL IMPRESSION(S) / ED DIAGNOSES  Final diagnoses:  Shortness of breath  Respiratory distress  Hyperkalemia  ESRD (end stage renal disease) on dialysis [facility])  Hypoxia     ED Discharge Orders    None       Note:  This document was prepared using Dragon voice recognition software and may include unintentional dictation errors.   Paulette Blanch, MD 07/18/19 [phone]

## 2019-07-18 NOTE — Progress Notes (Signed)
Stable for d/c avf+/+ ufg 3l

## 2019-07-18 NOTE — Progress Notes (Signed)
ANTICOAGULATION CONSULT NOTE - Initial Consult  Pharmacy Consult for Heparin Indication: VTE prophylaxis  Allergies  Allergen Reactions  . Penicillins Anaphylaxis    Was told it could kill him, pt unsure of reaction  Has patient had a PCN reaction causing immediate rash, facial/tongue/throat swelling, SOB or lightheadedness with hypotension: No Has patient had a PCN reaction causing severe rash involving mucus membranes or skin necrosis: No Has patient had a PCN reaction that required hospitalization: No Has patient had a PCN reaction occurring within the last 10 years: No If all of the above answers are "NO", then may proceed with Cephalosporin use.   . Cyclopentolate Other (See Comments)    Patient Measurements: Height: 5\' 11"  (180.3 cm) Weight: 260 lb 2.3 oz (118 kg) IBW/kg (Calculated) : 75.3 HEPARIN DW (KG): 101.9   Vital Signs: Temp: 99 F (37.2 C) (12/08 0334) Temp Source: Axillary (12/08 0334) BP: 188/88 (12/08 0342) Pulse Rate: 88 (12/08 0342)  Labs: Recent Labs    07/18/19 0049 07/18/19 0249 07/18/19 0250  HGB 11.1* 12.1*  --   HCT 33.8* 36.0*  --   PLT 177 194  --   CREATININE 18.77* 18.67*  --   TROPONINIHS 53*  --  51*    Estimated Creatinine Clearance: 5.2 mL/min (A) (by C-G formula based on SCr of 18.67 mg/dL (H)).   Medical History: Past Medical History:  Diagnosis Date  . CKD (chronic kidney disease) stage 5, GFR less than 15 ml/min (HCC)   . Diabetes mellitus without complication ([place])   . Dyspnea   . GERD (gastroesophageal reflux disease)   . Hypertension   . Lower extremity edema   . Myocardial infarction [facility])     Medications:  Medications Prior to Admission  Medication Sig Dispense Refill Last Dose  . acetaminophen (MAPAP) 325 MG tablet Take 325 mg by mouth every 4 (four) hours as needed for mild pain.      [name] albuterol (PROVENTIL HFA;VENTOLIN HFA) 108 (90 Base) MCG/ACT inhaler Inhale 1-2 puffs into the lungs every 6 (six) hours as  needed for wheezing or shortness of breath.      [name] amLODipine (NORVASC) 10 MG tablet Take 10 mg by mouth daily.      [name] aspirin EC 81 MG tablet Take 81 mg daily by mouth.     Lorin Picket 1 GM 210 MG(Fe) tablet Take 210 mg by mouth 3 (three) times daily with meals.   10   . b complex-vitamin c-folic acid (NEPHRO-VITE) 0.8 MG TABS tablet Take 1 tablet by mouth every Monday, Wednesday, and Friday. After dialysis treatment  3   . calcium acetate (PHOSLO) 667 MG capsule Take 1,334 mg by mouth 3 (three) times daily with meals.   10   . calcium carbonate (TUMS EX) 750 MG chewable tablet Chew 2 tablets by mouth at bedtime.     . gabapentin (NEURONTIN) 300 MG capsule Take 300 mg by mouth 3 (three) times daily.      . heparin 1000 UNIT/ML injection Inject 8,000 Units into the vein every dialysis.      . Insulin Detemir (LEVEMIR FLEXTOUCH) 100 UNIT/ML Pen Inject 66 Units into the skin at bedtime.      [name] lanthanum (FOSRENOL) 1000 MG chewable tablet Chew 3,000 mg by mouth 3 (three) times daily with meals.   2   . lisinopril (ZESTRIL) 40 MG tablet Take 40 mg by mouth daily.      . metoprolol succinate (TOPROL-XL) 50 MG 24 hr  tablet Take 50 mg by mouth daily.      [name] oxyCODONE-acetaminophen (PERCOCET) 5-325 MG tablet Take 1 tablet by mouth every 6 (six) hours as needed for severe pain. 30 tablet 0   . rOPINIRole (REQUIP) 1 MG tablet Take 1 mg by mouth 3 (three) times daily.        Assessment: Pharmacy asked to initiate and monitor Heparin.  No anticoagulants PTA per med list.  Baseline labs ordered.   Goal of Therapy:  Heparin level 0.3-0.7 units/ml Monitor platelets by anticoagulation protocol: Yes   Plan:  Heparin 4000 units bolus x 1 then infusion at 1400 units/hr Check HL in 8 hours  [place] Crane, Babette Stum A 07/18/2019,4:18 AM

## 2019-07-18 NOTE — Progress Notes (Signed)
Established hemodialysis patient known at [facility] TTS 6:00. Patient normally drives self to treatments. Please note that due to COVID results patient will need to change clinics. Please contact me before patient is ready for discharge to discuss new dialysis plan.    Elvera Bicker Dialysis Coordinator [phone]

## 2019-07-18 NOTE — ED Notes (Signed)
Lab notified that blood draw will be needed for troponin

## 2019-07-18 NOTE — Progress Notes (Signed)
Case discussed with Dr Beather Arbour by Phone Patient with BP 231/115 and increased oxygen requirement. About 2 kg over EDW  Dialysis nurse notified for emergency dialysis and orders placed Will need iv labetalol or nicardipine to control BP as it may be contributing to shortness of breath

## 2019-07-18 NOTE — Progress Notes (Signed)
Remdesivir - Pharmacy Brief Note   O:  ALT:  CXR:  SpO2: % on    A/P:  Remdesivir 200 mg IVPB once followed by 100 mg IVPB daily x 4 days.   Hart Robinsons, PharmD Clinical Pharmacist  07/18/2019   07/18/2019 4:03 AM

## 2019-07-18 NOTE — ED Triage Notes (Signed)
Pt arrives to ED via POV from home with c/o [facility] since 7pm lat night. Pt denies any c/o CP. Pt is a dialysis pt (T/Th/Sat) and reports a complete t/x perfromed last Saturday. Pt is alert; having difficulty speaking in complete sentences d/t SHOB, with non-productive cough.

## 2019-07-18 NOTE — ED Notes (Addendum)
Pt states he feels like he has drank too much fluid over past few days. He normally gets short of breath like this when he does. Pt had full dialysis treatment saturday

## 2019-07-18 NOTE — Progress Notes (Signed)
   07/18/19 0400  Neurological  Level of Consciousness Responds to Voice  Respiratory  Respiratory Pattern Labored  Bilateral Breath Sounds Expiratory wheezes;Inspiratory wheezes  Cardiac  Pulse Regular  Heart Sounds S1, S2  ECG Monitor Yes  pt is stable for HD tx AVF+/+ ufg3L

## 2019-07-18 NOTE — Progress Notes (Signed)
[facility], [place] 07/18/19  Subjective:   Hospital day # 0 Patient presented with high BP and SOB Emergent HD last hight COVID-19  Wheezing this morning 2500 cc removed  Neuro: lethargic, follows some commands Cvs: nicardipine drip Pulm: still wheezing  Renal: 12/07 0701 - 12/08 0700 In: -  Out: 2500  Lab Results  Component Value Date   CREATININE 18.67 (H) 07/18/2019   CREATININE 18.77 (H) 07/18/2019   CREATININE 17.49 (H) 06/15/2019     Objective:  Vital signs in last 24 hours:  Temp:  [98.9 F (37.2 C)-99 F (37.2 C)] 98.9 F (37.2 C) (12/08 0700) Pulse Rate:  [73-99] 90 (12/08 0715) Resp:  [19-32] 32 (12/08 0715) BP: (122-262)/(70-119) 222/119 (12/08 0814) SpO2:  [82 %-99 %] 99 % (12/08 0715) FiO2 (%):  [5 %] 5 % (12/08 0400) Weight:  [161 kg-120.2 kg] 118 kg (12/08 0335)  Weight change:  Filed Weights   07/18/19 0035 07/18/19 0335  Weight: 120.2 kg 118 kg    Intake/Output:    Intake/Output Summary (Last 24 hours) at 07/18/2019 0844 Last data filed at 07/18/2019 0700 Gross per 24 hour  Intake -  Output 2500 ml  Net -2500 ml    Exam findings discussed with staff  Detailed exam not performed due to covid + status to preserve PPE    Basic Metabolic Panel:  Recent Labs  Lab 07/18/19 0049 07/18/19 0249  NA 138 136  K 6.6* 6.1*  CL 98 96*  CO2 22 21*  GLUCOSE 140* 214*  BUN 99* 106*  CREATININE 18.77* 18.67*  CALCIUM 7.5* 7.3*     CBC: Recent Labs  Lab 07/18/19 0049 07/18/19 0249  WBC 8.9 11.4*  NEUTROABS 6.1  --   HGB 11.1* 12.1*  HCT 33.8* 36.0*  MCV 95.2 91.8  PLT 177 194      Lab Results  Component Value Date   HEPBSAG NON REACTIVE 05/23/2019      Microbiology:  Recent Results (from the past 240 hour(s))  Respiratory Panel by RT PCR (Flu A&B, Covid) - Nasopharyngeal Swab     Status: Abnormal   Collection Time: 07/18/19  1:03 AM   Specimen: Nasopharyngeal Swab  Result Value Ref  Range Status   SARS Coronavirus 2 by RT PCR POSITIVE (A) NEGATIVE Final    Comment: RESULT CALLED TO, READ BACK BY AND VERIFIED WITH: Georgann Housekeeper RN [phone] 07/18/2019 HNM (NOTE) SARS-CoV-2 target nucleic acids are DETECTED. SARS-CoV-2 RNA is generally detectable in upper respiratory specimens  during the acute phase of infection. Positive results are indicative of the presence of the identified virus, but do not rule out bacterial infection or co-infection with other pathogens not detected by the test. Clinical correlation with patient history and other diagnostic information is necessary to determine patient infection status. The expected result is Negative. Fact Sheet for Patients:  PinkCheek.be Fact Sheet for Healthcare Providers: GravelBags.it This test is not yet approved or cleared by the [place] FDA and  has been authorized for detection and/or diagnosis of SARS-CoV-2 by FDA under an Emergency Use Authorization (EUA).  This EUA will remain in effect (meaning this test can be used) fo r the duration of  the COVID-19 declaration under Section 564(b)(1) of the Act, 21 U.S.C. section 360bbb-3(b)(1), unless the authorization is terminated or revoked sooner.    Influenza A by PCR NEGATIVE NEGATIVE Final   Influenza B by PCR NEGATIVE NEGATIVE Final    Comment: (NOTE) The Xpert Xpress  SARS-CoV-2/FLU/RSV assay is intended as an aid in  the diagnosis of influenza from Nasopharyngeal swab specimens and  should not be used as a sole basis for treatment. Nasal washings and  aspirates are unacceptable for Xpert Xpress SARS-CoV-2/FLU/RSV  testing. Fact Sheet for Patients: PinkCheek.be Fact Sheet for Healthcare Providers: GravelBags.it This test is not yet approved or cleared by the [place] FDA and  has been authorized for detection and/or diagnosis of SARS-CoV-2  by  FDA under an Emergency Use Authorization (EUA). This EUA will remain  in effect (meaning this test can be used) for the duration of the  Covid-19 declaration under Section 564(b)(1) of the Act, 21  U.S.C. section 360bbb-3(b)(1), unless the authorization is  terminated or revoked. Performed at [facility], [address] [place], [place] [address]   Culture, blood (Routine X 2) w Reflex to ID Panel     Status: None (Preliminary result)   Collection Time: 07/18/19  4:33 AM   Specimen: BLOOD  Result Value Ref Range Status   Specimen Description BLOOD DIALYSIS PORT  Final   Special Requests   Final    BOTTLES DRAWN AEROBIC AND ANAEROBIC Blood Culture adequate volume   Culture   Final    NO GROWTH <12 HOURS Performed at [facility], [address] [place], [place] [address]    Report Status PENDING  Incomplete  Culture, blood (Routine X 2) w Reflex to ID Panel     Status: None (Preliminary result)   Collection Time: 07/18/19  4:33 AM   Specimen: BLOOD  Result Value Ref Range Status   Specimen Description BLOOD BLOOD RIGHT HAND  Final   Special Requests   Final    BOTTLES DRAWN AEROBIC AND ANAEROBIC Blood Culture adequate volume   Culture   Final    NO GROWTH <12 HOURS Performed at [facility], [address] [place], [place] [address]    Report Status PENDING  Incomplete    Coagulation Studies: Recent Labs    07/18/19 0433  LABPROT 14.2  INR 1.1    Urinalysis: No results for input(s): COLORURINE, LABSPEC, PHURINE, GLUCOSEU, HGBUR, BILIRUBINUR, KETONESUR, PROTEINUR, UROBILINOGEN, NITRITE, LEUKOCYTESUR in the last 72 hours.  Invalid input(s): APPERANCEUR    Imaging: Dg Chest Port 1 View  Result Date: 07/18/2019 CLINICAL DATA:  Shortness of breath EXAM: PORTABLE CHEST 1 VIEW COMPARISON:  June 14, 2019 FINDINGS: Heart size is enlarged. There are worsening diffuse bilateral interstitial airspace opacities with more hazy focal  opacities at the lung bases bilaterally. There is no pneumothorax. No large pleural effusion. There is no acute osseous abnormality. There is no pneumothorax. IMPRESSION: Overall findings can be seen in patients with congestive heart failure or an atypical infectious process in the appropriate clinical setting. Electronically Signed   By: Constance Holster M.D.   On: 07/18/2019 01:22     Medications:   . calcium gluconate    . heparin 1,400 Units/hr (07/18/19 0430)  . niCARDipine 10 mg/hr (07/18/19 0746)  . [START ON 07/19/2019] remdesivir 100 mg in NS 100 mL     . amLODipine  10 mg Oral Daily  . aspirin EC  81 mg Oral Daily  . calcium acetate  1,334 mg Oral TID WC  . Chlorhexidine Gluconate Cloth  6 each Topical Q0600  . dexamethasone  6 mg Oral Q24H  . famotidine  20 mg Oral Daily  . folic acid  1 mg Oral Daily  . gabapentin  300 mg Oral  TID  . insulin aspart  0-5 Units Subcutaneous QHS  . insulin aspart  0-9 Units Subcutaneous TID WC  . metoprolol succinate  50 mg Oral Daily  . rOPINIRole  1 mg Oral TID  . sodium zirconium cyclosilicate  10 g Oral Once  . thiamine  100 mg Oral Daily  . vitamin C  500 mg Oral Daily  . zinc sulfate  220 mg Oral Daily   acetaminophen, albuterol, calcium carbonate, ondansetron (ZOFRAN) IV, oxyCODONE-acetaminophen  Assessment/ Plan:  63 y.o. African American male with past medical history of ESRD on HD TTS, diabetes mellitus type 2, hypertension, anemia chronic kidney disease, secondary hyperparathyroidism who was admitted with shortness of breath and tested positive for COVID-19  [facility]/[address]/TTHS/EDW 114  1. ESRD on HD TTS/hyperkalemia.    Emergent HD upon admission on 07/18/2019 2500 cc removed Evaluate daily for need of HD   2.  Acute respiratory failure.    COVID positive   3.  Secondary hyperparathyroidism.   Calcium acetate continued  4.  Anemia chronic kidney disease.   Lab Results  Component Value Date   HGB  12.1 (L) 07/18/2019  monitor    LOS: 0 Arinze Rivadeneira Candiss Norse 12/8/20208:44 AM  [place], [place]  Note: This note was prepared with Dragon dictation. Any transcription errors are unintentional

## 2019-07-19 DIAGNOSIS — J9622 Acute and chronic respiratory failure with hypercapnia: Secondary | ICD-10-CM

## 2019-07-19 LAB — CBC WITH DIFFERENTIAL/PLATELET
Abs Immature Granulocytes: 0.04 10*3/uL (ref 0.00–0.07)
Basophils Absolute: 0 10*3/uL (ref 0.0–0.1)
Basophils Relative: 0 %
Eosinophils Absolute: 0 10*3/uL (ref 0.0–0.5)
Eosinophils Relative: 0 %
HCT: 29.2 % — ABNORMAL LOW (ref 39.0–52.0)
Hemoglobin: 9.9 g/dL — ABNORMAL LOW (ref 13.0–17.0)
Immature Granulocytes: 1 %
Lymphocytes Relative: 13 %
Lymphs Abs: 1 10*3/uL (ref 0.7–4.0)
MCH: 30.7 pg (ref 26.0–34.0)
MCHC: 33.9 g/dL (ref 30.0–36.0)
MCV: 90.7 fL (ref 80.0–100.0)
Monocytes Absolute: 0.5 10*3/uL (ref 0.1–1.0)
Monocytes Relative: 7 %
Neutro Abs: 6.2 10*3/uL (ref 1.7–7.7)
Neutrophils Relative %: 79 %
Platelets: 138 10*3/uL — ABNORMAL LOW (ref 150–400)
RBC: 3.22 MIL/uL — ABNORMAL LOW (ref 4.22–5.81)
RDW: 13.8 % (ref 11.5–15.5)
WBC: 7.8 10*3/uL (ref 4.0–10.5)
nRBC: 0 % (ref 0.0–0.2)

## 2019-07-19 LAB — PHOSPHORUS
Phosphorus: 8.9 mg/dL — ABNORMAL HIGH (ref 2.5–4.6)
Phosphorus: 10.7 mg/dL — ABNORMAL HIGH (ref 2.5–4.6)

## 2019-07-19 LAB — COMPREHENSIVE METABOLIC PANEL
ALT: 47 U/L — ABNORMAL HIGH (ref 0–44)
AST: 30 U/L (ref 15–41)
Albumin: 3.5 g/dL (ref 3.5–5.0)
Alkaline Phosphatase: 81 U/L (ref 38–126)
Anion gap: 19 — ABNORMAL HIGH (ref 5–15)
BUN: 92 mg/dL — ABNORMAL HIGH (ref 8–23)
CO2: 20 mmol/L — ABNORMAL LOW (ref 22–32)
Calcium: 7.3 mg/dL — ABNORMAL LOW (ref 8.9–10.3)
Chloride: 95 mmol/L — ABNORMAL LOW (ref 98–111)
Creatinine, Ser: 17.48 mg/dL — ABNORMAL HIGH (ref 0.61–1.24)
GFR calc Af Amer: 3 mL/min — ABNORMAL LOW (ref >60)
GFR calc non Af Amer: 2 mL/min — ABNORMAL LOW (ref >60)
Glucose, Bld: 121 mg/dL — ABNORMAL HIGH (ref 70–99)
Potassium: 5 mmol/L (ref 3.5–5.1)
Sodium: 134 mmol/L — ABNORMAL LOW (ref 135–145)
Total Bilirubin: 0.6 mg/dL (ref 0.3–1.2)
Total Protein: 7.4 g/dL (ref 6.5–8.1)

## 2019-07-19 LAB — FERRITIN: Ferritin: 922 ng/mL — ABNORMAL HIGH (ref 24–336)

## 2019-07-19 LAB — GLUCOSE, CAPILLARY
Glucose-Capillary: 132 mg/dL — ABNORMAL HIGH (ref 70–99)
Glucose-Capillary: 196 mg/dL — ABNORMAL HIGH (ref 70–99)
Glucose-Capillary: 146 mg/dL — ABNORMAL HIGH (ref 70–99)

## 2019-07-19 LAB — HEPATITIS B SURFACE ANTIBODY, QUANTITATIVE: Hep B S AB Quant (Post): >1000 m[IU]/mL (ref >9.9)

## 2019-07-19 LAB — FIBRIN DERIVATIVES D-DIMER (ARMC ONLY): Fibrin derivatives D-dimer (ARMC): 1895.75 ng/mL (FEU) — ABNORMAL HIGH (ref 0.00–499.00)

## 2019-07-19 LAB — MAGNESIUM: Magnesium: 2.2 mg/dL (ref 1.7–2.4)

## 2019-07-19 LAB — C-REACTIVE PROTEIN: CRP: 12.1 mg/dL — ABNORMAL HIGH (ref <1.0)

## 2019-07-19 MED ORDER — HEPARIN SODIUM (PORCINE) 1000 UNIT/ML DIALYSIS
1000.0000 [IU] | INTRAMUSCULAR | Status: DC | PRN
  Filled 2019-07-19: qty 1

## 2019-07-19 MED ORDER — HEPARIN SODIUM (PORCINE) 10000 UNIT/ML IJ SOLN
7500.0000 [IU] | Freq: Three times a day (TID) | INTRAMUSCULAR | Status: DC
  Administered 2019-07-19 – 2019-07-21 (×4): 7500 [IU] via SUBCUTANEOUS
  Filled 2019-07-19 (×4): qty 1

## 2019-07-19 MED ORDER — EPOETIN ALFA 10000 UNIT/ML IJ SOLN: 4000.0000 [IU] | INTRAMUSCULAR | Status: DC

## 2019-07-19 MED ORDER — IPRATROPIUM-ALBUTEROL 20-100 MCG/ACT IN AERS
1.0000 | INHALATION_SPRAY | Freq: Three times a day (TID) | RESPIRATORY_TRACT | Status: DC
  Administered 2019-07-20 – 2019-07-21 (×6): 1 via RESPIRATORY_TRACT
  Filled 2019-07-19: qty 4

## 2019-07-19 NOTE — Progress Notes (Addendum)
Name: Donald Silva MRN: [id number] DOB: 06/21/1956    ADMISSION DATE:  07/18/2019 CONSULTATION DATE: 07/18/2019  REFERRING MD : Dr. Beather Arbour   Follow-up: Shortness of Breath/respiratory distress  BRIEF PATIENT DESCRIPTION:  63 yo male admitted with hypertensive urgency and acute on chronic hypoxic respiratory failure secondary to pulmonary edema in the setting of ESRD and COVID-19 requiring emergent hemodialysis and nicardipine gtt   SIGNIFICANT EVENTS/STUDIES:  12/8: Pt admitted to ICU with hypertensive urgency requiring nicardipine gtt and acute on chronic hypoxic respiratory failure secondary to COVID-19 and pulmonary edema in setting of ESRD 12/8: Pt underwent emergent hemodialysis  12/9: Continues to do well after dialysis, no shock physiology, no respiratory distress   Prior to Admission medications   Medication Sig Start Date End Date Taking? Authorizing Provider  acetaminophen (MAPAP) 325 MG tablet Take 325 mg by mouth every 4 (four) hours as needed for mild pain.  01/31/19 01/30/20  [provider]  albuterol (PROVENTIL HFA;VENTOLIN HFA) 108 (90 Base) MCG/ACT inhaler Inhale 1-2 puffs into the lungs every 6 (six) hours as needed for wheezing or shortness of breath.  08/17/18 08/17/19  [provider]  amLODipine (NORVASC) 10 MG tablet Take 10 mg by mouth daily.  09/02/18   [provider]  aspirin EC 81 MG tablet Take 81 mg daily by mouth.    [provider]  AURYXIA 1 GM 210 MG(Fe) tablet Take 210 mg by mouth 3 (three) times daily with meals.  05/20/18   [provider]  b complex-vitamin c-folic acid (NEPHRO-VITE) 0.8 MG TABS tablet Take 1 tablet by mouth every Monday, Wednesday, and Friday. After dialysis treatment 03/11/18   [provider]  calcium acetate (PHOSLO) 667 MG capsule Take 1,334 mg by mouth 3 (three) times daily with meals.  01/28/18   [provider]  calcium carbonate (TUMS EX) 750 MG chewable tablet Chew 2  tablets by mouth at bedtime.    [provider]  gabapentin (NEURONTIN) 300 MG capsule Take 300 mg by mouth 3 (three) times daily.  07/22/18   [provider]  heparin 1000 UNIT/ML injection Inject 8,000 Units into the vein every dialysis.  01/17/19 01/16/20  [provider]  Insulin Detemir (LEVEMIR FLEXTOUCH) 100 UNIT/ML Pen Inject 66 Units into the skin at bedtime.  06/01/16   [provider]  lanthanum (FOSRENOL) 1000 MG chewable tablet Chew 3,000 mg by mouth 3 (three) times daily with meals.  05/30/18   [provider]  lisinopril (ZESTRIL) 40 MG tablet Take 40 mg by mouth daily.  02/05/18   [provider]  metoprolol succinate (TOPROL-XL) 50 MG 24 hr tablet Take 50 mg by mouth daily.  09/12/18   [provider]  oxyCODONE-acetaminophen (PERCOCET) 5-325 MG tablet Take 1 tablet by mouth every 6 (six) hours as needed for severe pain. 06/20/19   Edrick Kins, DPM  rOPINIRole (REQUIP) 1 MG tablet Take 1 mg by mouth 3 (three) times daily.  03/23/19   [provider]   Current inpatient medications reviewed with Pharm.D.   Allergies  Allergen Reactions  . Penicillins Anaphylaxis    Was told it could kill him, pt unsure of reaction  Has patient had a PCN reaction causing immediate rash, facial/tongue/throat swelling, SOB or lightheadedness with hypotension: No Has patient had a PCN reaction causing severe rash involving mucus membranes or skin necrosis: No Has patient had a PCN reaction that required hospitalization: No Has patient had a PCN reaction occurring  within the last 10 years: No If all of the above answers are "NO", then may proceed with Cephalosporin use.   . Cyclopentolate Other (See Comments)   REVIEW OF SYSTEMS: Positives in BOLD  Constitutional: Negative for fever, chills, weight loss, malaise/fatigue and diaphoresis.  HENT: Negative for hearing loss, ear pain, nosebleeds, congestion, sore throat, neck pain,  tinnitus and ear discharge.   Eyes: Negative for blurred vision, double vision, photophobia, pain, discharge and redness.  Respiratory: cough, hemoptysis, sputum production, shortness of breath (better), wheezing and stridor.   Cardiovascular: Negative for chest pain, palpitations, orthopnea, claudication, leg swelling and PND.  Gastrointestinal: Negative for heartburn, nausea, vomiting, abdominal pain, diarrhea, constipation, blood in stool and melena.  Genitourinary: Negative for dysuria, urgency, frequency, hematuria and flank pain.  Musculoskeletal: Negative for myalgias, back pain, joint pain and falls.  Skin: Negative for itching and rash.  Neurological: Negative for dizziness, tingling, tremors, sensory change, speech change, focal weakness, seizures, loss of consciousness, weakness and headaches.  Endo/Heme/Allergies: Negative for environmental allergies and polydipsia. Does not bruise/bleed easily.  SUBJECTIVE:  Only complaint today is that of nausea.  Breathing is markedly better.  Sometimes impulsive.  VITAL SIGNS: Temp:  [98.4 F (36.9 C)-99.6 F (37.6 C)] 98.4 F (36.9 C) (12/09 1631) Pulse Rate:  [69-76] 69 (12/09 1631) Resp:  [18-27] 18 (12/09 1631) BP: (119-160)/(68-80) 146/77 (12/09 1631) SpO2:  [95 %-100 %] 100 % (12/09 1631) Weight:  [932.3 kg] 116.8 kg (12/09 1631)  PHYSICAL EXAMINATION: General: acutely ill appearing male, in nor espiratory distress  Neuro: alert and oriented, follows commands  HEENT: JVD present Cardiovascular: nsr. Rrr, no R/G  Lungs: Coarse breath sounds, no increased work of breathing Abdomen: +BS x4, obese, soft, non tender, non distended  Musculoskeletal: 1+ bilateral lower extremity edema, right great toe amputation  Skin: left upper extremity av fistula +bruit and thrill, ulceration with minimal drainage present at right great toe amputation site   Recent Labs  Lab 07/18/19 0249 07/18/19 0952 07/19/19 0505  NA 136 137 134*  K 6.1*  4.8 5.0  CL 96* 97* 95*  CO2 21* 21* 20*  BUN 106* 78* 92*  CREATININE 18.67* 15.34* 17.48*  GLUCOSE 214* 132* 121*   Recent Labs  Lab 07/18/19 0049 07/18/19 0249 07/19/19 0505  HGB 11.1* 12.1* 9.9*  HCT 33.8* 36.0* 29.2*  WBC 8.9 11.4* 7.8  PLT 177 194 138*   Dg Chest Port 1 View  Result Date: 07/18/2019 CLINICAL DATA:  Shortness of breath EXAM: PORTABLE CHEST 1 VIEW COMPARISON:  June 14, 2019 FINDINGS: Heart size is enlarged. There are worsening diffuse bilateral interstitial airspace opacities with more hazy focal opacities at the lung bases bilaterally. There is no pneumothorax. No large pleural effusion. There is no acute osseous abnormality. There is no pneumothorax. IMPRESSION: Overall findings can be seen in patients with congestive heart failure or an atypical infectious process in the appropriate clinical setting. Electronically Signed   By: Constance Holster M.D.   On: 07/18/2019 01:22    ASSESSMENT / PLAN:  Acute on chronic hypoxic respiratory failure secondary to COVID-19 and pulmonary edema in the setting of ESRD  COVID-!9 Pneumonia Supplemental O2 for dyspnea and/or hypoxia  PRN bronchodilator therapy COVID-19 treatment protocol: remdesivir, decadron, and mvi's Encourage to self-prone as tolerated  OOB to chair at least 3 times daily Pulmonary hygiene Trend WBC and monitor fever curve Trend PCT  Follow cultures  Maintain airborne and contact precautions   Hypertensive urgency Elevated troponin  likely demand ischemia in setting of acute respiratory failure  Hx: MI  Has better blood pressure control after dialysis Continuous telemetry monitoring  Trend troponin's Continue outpatient cardiac medications    ESRD on HD Hyperkalemia  Trend BMP  Replace electrolytes as indicated  Monitor UOP Avoid nephrotoxic medications  Nephrology consulted appreciate input IV lasix as tolerated  Scheduled dialysis tomorrow Hold outpatient lisinopril for now given  hyperkalemia  Elevated liver enzymes  Trend hepatic panel   Anemia of chronic kidney disease  Trend CBC  Monitor for s/sx of bleeding and transfuse for hgb <7  Type II diabetes mellitus  CBG's ac/hs SSI   Best practice:  VTE px: Did not tolerate heparin infusion, on high dose heparin subcu prophylaxis (COVID-19 protocol).  7,500 units heparin subcu 3 times daily SUP px: po famotidine  Diet: renal diet with fluid restrictions    May transfer to [place] ward and transition to hospitalist service. Discussed with Dr. Candiss Norse, nephrology.    Renold Don, MD [facility]

## 2019-07-19 NOTE — Progress Notes (Signed)
[place], [place] 07/19/19  Subjective:   Hospital day # 1 Patient presented with high BP and SOB Emergent HD on Monday night, 2500 cc removed COVID-19  positive Case discussed with ICU nursing Neuro: More alert, improving Blood pressure control and pulmonary symptoms have improved  Renal: 12/08 0701 - 12/09 0700 In: 1648.3 [P.O.:240; I.V.:1118.3; IV Piggyback:290] Out: -  Lab Results  Component Value Date   CREATININE 17.48 (H) 07/19/2019   CREATININE 15.34 (H) 07/18/2019   CREATININE 18.67 (H) 07/18/2019     Objective:  Vital signs in last 24 hours:  Temp:  [99.1 F (37.3 C)-99.8 F (37.7 C)] 99.1 F (37.3 C) (12/09 0800) Pulse Rate:  [71-84] 76 (12/08 2042) Resp:  [17-30] 19 (12/08 2042) BP: (90-157)/(44-88) 144/72 (12/08 2042) SpO2:  [92 %-98 %] 96 % (12/08 2042)  Weight change:  Filed Weights   07/18/19 0035 07/18/19 0335  Weight: 120.2 kg 118 kg    Intake/Output:    Intake/Output Summary (Last 24 hours) at 07/19/2019 1103 Last data filed at 07/18/2019 1800 Gross per 24 hour  Intake 1000.31 ml  Output -  Net 1000.31 ml    Exam findings discussed with staff  Detailed exam not performed due to covid + status to preserve PPE    Basic Metabolic Panel:  Recent Labs  Lab 07/18/19 0049 07/18/19 0249 07/18/19 0952 07/19/19 0505  NA 138 136 137 134*  K 6.6* 6.1* 4.8 5.0  CL 98 96* 97* 95*  CO2 22 21* 21* 20*  GLUCOSE 140* 214* 132* 121*  BUN 99* 106* 78* 92*  CREATININE 18.77* 18.67* 15.34* 17.48*  CALCIUM 7.5* 7.3* 7.8* 7.3*  MG  --   --   --  2.2  PHOS  --   --   --  8.9*     CBC: Recent Labs  Lab 07/18/19 0049 07/18/19 0249 07/19/19 0505  WBC 8.9 11.4* 7.8  NEUTROABS 6.1  --  6.2  HGB 11.1* 12.1* 9.9*  HCT 33.8* 36.0* 29.2*  MCV 95.2 91.8 90.7  PLT 177 194 138*      Lab Results  Component Value Date   HEPBSAG NON REACTIVE 07/18/2019      Microbiology:  Recent Results (from the past 240  hour(s))  Respiratory Panel by RT PCR (Flu A&B, Covid) - Nasopharyngeal Swab     Status: Abnormal   Collection Time: 07/18/19  1:03 AM   Specimen: Nasopharyngeal Swab  Result Value Ref Range Status   SARS Coronavirus 2 by RT PCR POSITIVE (A) NEGATIVE Final    Comment: RESULT CALLED TO, READ BACK BY AND VERIFIED WITH: Georgann Housekeeper RN [phone] 07/18/2019 HNM (NOTE) SARS-CoV-2 target nucleic acids are DETECTED. SARS-CoV-2 RNA is generally detectable in upper respiratory specimens  during the acute phase of infection. Positive results are indicative of the presence of the identified virus, but do not rule out bacterial infection or co-infection with other pathogens not detected by the test. Clinical correlation with patient history and other diagnostic information is necessary to determine patient infection status. The expected result is Negative. Fact Sheet for Patients:  PinkCheek.be Fact Sheet for Healthcare Providers: GravelBags.it This test is not yet approved or cleared by the [place] FDA and  has been authorized for detection and/or diagnosis of SARS-CoV-2 by FDA under an Emergency Use Authorization (EUA).  This EUA will remain in effect (meaning this test can be used) fo r the duration of  the COVID-19 declaration under Section 564(b)(1) of  the Act, 21 U.S.C. section 360bbb-3(b)(1), unless the authorization is terminated or revoked sooner.    Influenza A by PCR NEGATIVE NEGATIVE Final   Influenza B by PCR NEGATIVE NEGATIVE Final    Comment: (NOTE) The Xpert Xpress SARS-CoV-2/FLU/RSV assay is intended as an aid in  the diagnosis of influenza from Nasopharyngeal swab specimens and  should not be used as a sole basis for treatment. Nasal washings and  aspirates are unacceptable for Xpert Xpress SARS-CoV-2/FLU/RSV  testing. Fact Sheet for Patients: PinkCheek.be Fact Sheet for Healthcare  Providers: GravelBags.it This test is not yet approved or cleared by the [place] FDA and  has been authorized for detection and/or diagnosis of SARS-CoV-2 by  FDA under an Emergency Use Authorization (EUA). This EUA will remain  in effect (meaning this test can be used) for the duration of the  Covid-19 declaration under Section 564(b)(1) of the Act, 21  U.S.C. section 360bbb-3(b)(1), unless the authorization is  terminated or revoked. Performed at [facility], [address] [place], [place] [address]   Culture, blood (Routine X 2) w Reflex to ID Panel     Status: None (Preliminary result)   Collection Time: 07/18/19  4:33 AM   Specimen: BLOOD  Result Value Ref Range Status   Specimen Description BLOOD DIALYSIS PORT  Final   Special Requests   Final    BOTTLES DRAWN AEROBIC AND ANAEROBIC Blood Culture adequate volume   Culture   Final    NO GROWTH 1 DAY Performed at [facility], [address], [place], [place] [address]    Report Status PENDING  Incomplete  Culture, blood (Routine X 2) w Reflex to ID Panel     Status: None (Preliminary result)   Collection Time: 07/18/19  4:33 AM   Specimen: BLOOD  Result Value Ref Range Status   Specimen Description BLOOD BLOOD RIGHT HAND  Final   Special Requests   Final    BOTTLES DRAWN AEROBIC AND ANAEROBIC Blood Culture adequate volume   Culture   Final    NO GROWTH 1 DAY Performed at [facility], [address] [place], [place] [address]    Report Status PENDING  Incomplete    Coagulation Studies: Recent Labs    07/18/19 0433  LABPROT 14.2  INR 1.1    Urinalysis: No results for input(s): COLORURINE, LABSPEC, PHURINE, GLUCOSEU, HGBUR, BILIRUBINUR, KETONESUR, PROTEINUR, UROBILINOGEN, NITRITE, LEUKOCYTESUR in the last 72 hours.  Invalid input(s): APPERANCEUR    Imaging: Dg Chest Port 1 View  Result Date: 07/18/2019 CLINICAL DATA:  Shortness of breath EXAM:  PORTABLE CHEST 1 VIEW COMPARISON:  June 14, 2019 FINDINGS: Heart size is enlarged. There are worsening diffuse bilateral interstitial airspace opacities with more hazy focal opacities at the lung bases bilaterally. There is no pneumothorax. No large pleural effusion. There is no acute osseous abnormality. There is no pneumothorax. IMPRESSION: Overall findings can be seen in patients with congestive heart failure or an atypical infectious process in the appropriate clinical setting. Electronically Signed   By: Constance Holster M.D.   On: 07/18/2019 01:22     Medications:   . niCARDipine Stopped (07/18/19 1621)  . remdesivir 100 mg in NS 100 mL 100 mg (07/19/19 1048)   . amLODipine  10 mg Oral Daily  . aspirin EC  81 mg Oral Daily  . calcium acetate  1,334 mg Oral TID WC  . Chlorhexidine Gluconate Cloth  6 each Topical Q0600  . dexamethasone  6 mg  Oral Q24H  . famotidine  20 mg Oral QODAY  . folic acid  1 mg Oral Daily  . heparin injection (subcutaneous)  10,000 Units Subcutaneous Q8H  . insulin aspart  0-5 Units Subcutaneous QHS  . insulin aspart  0-9 Units Subcutaneous TID WC  . metoprolol succinate  50 mg Oral Daily  . rOPINIRole  1 mg Oral TID  . sodium zirconium cyclosilicate  10 g Oral Once  . thiamine  100 mg Oral Daily  . vitamin C  500 mg Oral Daily  . zinc sulfate  220 mg Oral Daily   acetaminophen, albuterol, calcium carbonate, ondansetron (ZOFRAN) IV, oxyCODONE-acetaminophen  Assessment/ Plan:  63 y.o. African American male with past medical history of ESRD on HD TTS, diabetes mellitus type 2, hypertension, anemia chronic kidney disease, secondary hyperparathyroidism who was admitted with shortness of breath and tested positive for COVID-19  [facility]/[address]/TTHS/EDW 114  1. ESRD on HD TTS/hyperkalemia.    Emergent HD upon admission on 07/18/2019 2500 cc removed Next hemodialysis planned for Thursday  2.  Acute respiratory failure.    COVID  positive Treatment as per ICU/hospitalist team  3.  Secondary hyperparathyroidism.   Calcium acetate continued  4.  Anemia chronic kidney disease.   Lab Results  Component Value Date   HGB 9.9 (L) 07/19/2019  monitor Epogen with hemodialysis   LOS: 1 Rufus Cypert 12/9/202011:03 AM  [place], [place]  Note: This note was prepared with Dragon dictation. Any transcription errors are unintentional

## 2019-07-20 DIAGNOSIS — I10 Essential (primary) hypertension: Secondary | ICD-10-CM

## 2019-07-20 DIAGNOSIS — J1289 Other viral pneumonia: Secondary | ICD-10-CM

## 2019-07-20 DIAGNOSIS — E8770 Fluid overload, unspecified: Secondary | ICD-10-CM

## 2019-07-20 DIAGNOSIS — G2581 Restless legs syndrome: Secondary | ICD-10-CM

## 2019-07-20 DIAGNOSIS — E875 Hyperkalemia: Secondary | ICD-10-CM

## 2019-07-20 DIAGNOSIS — E1122 Type 2 diabetes mellitus with diabetic chronic kidney disease: Secondary | ICD-10-CM

## 2019-07-20 DIAGNOSIS — J9601 Acute respiratory failure with hypoxia: Secondary | ICD-10-CM

## 2019-07-20 LAB — CBC WITH DIFFERENTIAL/PLATELET
Abs Immature Granulocytes: 0.04 10*3/uL (ref 0.00–0.07)
Basophils Absolute: 0 10*3/uL (ref 0.0–0.1)
Basophils Relative: 0 %
Eosinophils Absolute: 0.2 10*3/uL (ref 0.0–0.5)
Eosinophils Relative: 3 %
HCT: 28.1 % — ABNORMAL LOW (ref 39.0–52.0)
Hemoglobin: 9.5 g/dL — ABNORMAL LOW (ref 13.0–17.0)
Immature Granulocytes: 1 %
Lymphocytes Relative: 17 %
Lymphs Abs: 1 10*3/uL (ref 0.7–4.0)
MCH: 30.4 pg (ref 26.0–34.0)
MCHC: 33.8 g/dL (ref 30.0–36.0)
MCV: 90.1 fL (ref 80.0–100.0)
Monocytes Absolute: 0.5 10*3/uL (ref 0.1–1.0)
Monocytes Relative: 8 %
Neutro Abs: 4.4 10*3/uL (ref 1.7–7.7)
Neutrophils Relative %: 71 %
Platelets: 140 10*3/uL — ABNORMAL LOW (ref 150–400)
RBC: 3.12 MIL/uL — ABNORMAL LOW (ref 4.22–5.81)
RDW: 13.6 % (ref 11.5–15.5)
WBC: 6.1 10*3/uL (ref 4.0–10.5)
nRBC: 0 % (ref 0.0–0.2)

## 2019-07-20 LAB — COMPREHENSIVE METABOLIC PANEL
ALT: 38 U/L (ref 0–44)
AST: 23 U/L (ref 15–41)
Albumin: 3.4 g/dL — ABNORMAL LOW (ref 3.5–5.0)
Alkaline Phosphatase: 72 U/L (ref 38–126)
Anion gap: 22 — ABNORMAL HIGH (ref 5–15)
BUN: 116 mg/dL — ABNORMAL HIGH (ref 8–23)
CO2: 19 mmol/L — ABNORMAL LOW (ref 22–32)
Calcium: 7.5 mg/dL — ABNORMAL LOW (ref 8.9–10.3)
Chloride: 94 mmol/L — ABNORMAL LOW (ref 98–111)
Creatinine, Ser: 19.79 mg/dL — ABNORMAL HIGH (ref 0.61–1.24)
GFR calc Af Amer: 2 mL/min — ABNORMAL LOW (ref >60)
GFR calc non Af Amer: 2 mL/min — ABNORMAL LOW (ref >60)
Glucose, Bld: 98 mg/dL (ref 70–99)
Potassium: 5.7 mmol/L — ABNORMAL HIGH (ref 3.5–5.1)
Sodium: 135 mmol/L (ref 135–145)
Total Bilirubin: 0.7 mg/dL (ref 0.3–1.2)
Total Protein: 7.3 g/dL (ref 6.5–8.1)

## 2019-07-20 LAB — GLUCOSE, CAPILLARY
Glucose-Capillary: 102 mg/dL — ABNORMAL HIGH (ref 70–99)
Glucose-Capillary: 126 mg/dL — ABNORMAL HIGH (ref 70–99)
Glucose-Capillary: 136 mg/dL — ABNORMAL HIGH (ref 70–99)
Glucose-Capillary: 182 mg/dL — ABNORMAL HIGH (ref 70–99)

## 2019-07-20 LAB — C-REACTIVE PROTEIN: CRP: 11.4 mg/dL — ABNORMAL HIGH (ref <1.0)

## 2019-07-20 LAB — MAGNESIUM: Magnesium: 2.5 mg/dL — ABNORMAL HIGH (ref 1.7–2.4)

## 2019-07-20 LAB — FIBRIN DERIVATIVES D-DIMER (ARMC ONLY): Fibrin derivatives D-dimer (ARMC): 1355.28 ng/mL (FEU) — ABNORMAL HIGH (ref 0.00–499.00)

## 2019-07-20 LAB — PHOSPHORUS: Phosphorus: 10.9 mg/dL — ABNORMAL HIGH (ref 2.5–4.6)

## 2019-07-20 LAB — FERRITIN: Ferritin: 1184 ng/mL — ABNORMAL HIGH (ref 24–336)

## 2019-07-20 MED ORDER — ACETAMINOPHEN 325 MG PO TABS: 650.0000 mg | ORAL_TABLET | ORAL | Status: DC | PRN

## 2019-07-20 MED ORDER — SODIUM CHLORIDE 0.9 % IV SOLN
100.0000 mg | Freq: Every day | INTRAVENOUS | Status: DC
  Administered 2019-07-20 – 2019-07-21 (×2): 100 mg via INTRAVENOUS
  Filled 2019-07-20 (×2): qty 100

## 2019-07-20 NOTE — Progress Notes (Signed)
   07/20/19 1000  Neurological  Level of Consciousness Alert  Orientation Level Oriented X4  Respiratory  Respiratory Pattern Regular  Bilateral Breath Sounds Diminished  Cough Non-productive  Cardiac  Pulse Regular  Heart Sounds S1, S2  ECG Monitor Yes  pt stable for HD TX vitals stable no c/os avf +/+ ufg 1.5L

## 2019-07-20 NOTE — Progress Notes (Signed)
Spoke with patient's home clinic, patient will need to treat at [facility] in [place] upon discharge. Please contact me when patient is discharge ready so all involved parties will be aware of patient's new dialysis schedule, discharge and pt arrival to clinic.   Elvera Bicker Dialysis Coordinator [phone]

## 2019-07-20 NOTE — Progress Notes (Signed)
Patient ID: Donald Silva, male   DOB: Apr 09, 1956, 63 y.o.   MRN: [id number] [facility] PROGRESS NOTE  Donald Silva MGN:[id number] DOB: February 05, 1956 DOA: 07/18/2019 PCP: Letta Median, MD  HPI/Subjective: Patient feeling much better than when he came in.  When he came in he states he was short of breath and coughing nonproductive.  He states he can breathe a lot better now.  He does not wear oxygen at home and currently on oxygen.  Objective: Vitals:   07/20/19 1215 07/20/19 1230  BP: (!) 173/77 (!) 164/76  Pulse: 71 74  Resp: 20 16  Temp:    SpO2: 100% 100%    Intake/Output Summary (Last 24 hours) at 07/20/2019 1250 Last data filed at 07/19/2019 1810 Gross per 24 hour  Intake -  Output 0 ml  Net 0 ml   Filed Weights   07/18/19 0335 07/19/19 1631 07/20/19 0546  Weight: 118 kg 116.8 kg 115.9 kg    ROS: Review of Systems  Constitutional: Negative for chills and fever.  Eyes: Negative for blurred vision.  Respiratory: Positive for cough and shortness of breath.   Cardiovascular: Negative for chest pain.  Gastrointestinal: Positive for diarrhea. Negative for abdominal pain, constipation, nausea and vomiting.  Genitourinary: Negative for dysuria.  Musculoskeletal: Negative for joint pain.  Neurological: Negative for dizziness and headaches.   Exam: Physical Exam  Constitutional: He is oriented to person, place, and time.  HENT:  Nose: No mucosal edema.  Mouth/Throat: No oropharyngeal exudate or posterior oropharyngeal edema.  Eyes: Pupils are equal, round, and reactive to light. Conjunctivae, EOM and lids are normal.  Neck: No JVD present. Carotid bruit is not present. No thyroid mass and no thyromegaly present.  Cardiovascular: S1 normal and S2 normal. Exam reveals no gallop.  No murmur heard. Pulses:      Dorsalis pedis pulses are 2+ on the right side and 2+ on the left side.  Respiratory: No respiratory distress. He has decreased breath sounds in the right  lower field and the left lower field. He has no wheezes. He has no rhonchi. He has no rales.  GI: Soft. Bowel sounds are normal. There is no abdominal tenderness.  Musculoskeletal:     Cervical back: No edema.     Right ankle: No swelling.     Left ankle: No swelling.  Lymphadenopathy:    He has no cervical adenopathy.  Neurological: He is alert and oriented to person, place, and time. No cranial nerve deficit.  Skin: Skin is warm. No rash noted. Nails show no clubbing.  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  No signs of infection  Psychiatric: He has a normal mood and affect.      Data Reviewed: Basic Metabolic Panel: Recent Labs  Lab 07/18/19 0049 07/18/19 0249 07/18/19 0952 07/19/19 0505 07/19/19 1824 07/20/19 0617  NA 138 136 137 134*  --  135  K 6.6* 6.1* 4.8 5.0  --  5.7*  CL 98 96* 97* 95*  --  94*  CO2 22 21* 21* 20*  --  19*  GLUCOSE 140* 214* 132* 121*  --  98  BUN 99* 106* 78* 92*  --  116*  CREATININE 18.77* 18.67* 15.34* 17.48*  --  19.79*  CALCIUM 7.5* 7.3* 7.8* 7.3*  --  7.5*  MG  --   --   --  2.2  --  2.5*  PHOS  --   --   --  8.9* 10.7*  10.9*   Liver Function Tests: Recent Labs  Lab 07/18/19 0049 07/18/19 0249 07/19/19 0505 07/20/19 0617  AST 67* 64* 30 23  ALT 80* 82* 47* 38  ALKPHOS 117 128* 81 72  BILITOT 0.6 0.5 0.6 0.7  PROT 8.7* 9.3* 7.4 7.3  ALBUMIN 4.4 4.5 3.5 3.4*   CBC: Recent Labs  Lab 07/18/19 0049 07/18/19 0249 07/19/19 0505 07/20/19 0617  WBC 8.9 11.4* 7.8 6.1  NEUTROABS 6.1  --  6.2 4.4  HGB 11.1* 12.1* 9.9* 9.5*  HCT 33.8* 36.0* 29.2* 28.1*  MCV 95.2 91.8 90.7 90.1  PLT 177 194 138* 140*   BNP (last 3 results) Recent Labs    07/18/19 0049  BNP 902.0*    CBG: Recent Labs  Lab 07/18/19 2218 07/19/19 1122 07/19/19 1634 07/19/19 2105 07/20/19 0756  GLUCAP 122* 132* 196* 146* 102*    Recent Results (from the past 240 hour(s))  Respiratory Panel by RT PCR (Flu A&B, Covid) - Nasopharyngeal  Swab     Status: Abnormal   Collection Time: 07/18/19  1:03 AM   Specimen: Nasopharyngeal Swab  Result Value Ref Range Status   SARS Coronavirus 2 by RT PCR POSITIVE (A) NEGATIVE Final    Comment: RESULT CALLED TO, READ BACK BY AND VERIFIED WITH: Georgann Housekeeper RN [id number] 07/18/2019 HNM (NOTE) SARS-CoV-2 target nucleic acids are DETECTED. SARS-CoV-2 RNA is generally detectable in upper respiratory specimens  during the acute phase of infection. Positive results are indicative of the presence of the identified virus, but do not rule out bacterial infection or co-infection with other pathogens not detected by the test. Clinical correlation with patient history and other diagnostic information is necessary to determine patient infection status. The expected result is Negative. Fact Sheet for Patients:  PinkCheek.be Fact Sheet for Healthcare Providers: GravelBags.it This test is not yet approved or cleared by the [place] FDA and  has been authorized for detection and/or diagnosis of SARS-CoV-2 by FDA under an Emergency Use Authorization (EUA).  This EUA will remain in effect (meaning this test can be used) fo r the duration of  the COVID-19 declaration under Section 564(b)(1) of the Act, 21 U.S.C. section 360bbb-3(b)(1), unless the authorization is terminated or revoked sooner.    Influenza A by PCR NEGATIVE NEGATIVE Final   Influenza B by PCR NEGATIVE NEGATIVE Final    Comment: (NOTE) The Xpert Xpress SARS-CoV-2/FLU/RSV assay is intended as an aid in  the diagnosis of influenza from Nasopharyngeal swab specimens and  should not be used as a sole basis for treatment. Nasal washings and  aspirates are unacceptable for Xpert Xpress SARS-CoV-2/FLU/RSV  testing. Fact Sheet for Patients: PinkCheek.be Fact Sheet for Healthcare Providers: GravelBags.it This test is not yet  approved or cleared by the [place] FDA and  has been authorized for detection and/or diagnosis of SARS-CoV-2 by  FDA under an Emergency Use Authorization (EUA). This EUA will remain  in effect (meaning this test can be used) for the duration of the  Covid-19 declaration under Section 564(b)(1) of the Act, 21  U.S.C. section 360bbb-3(b)(1), unless the authorization is  terminated or revoked. Performed at [facility], [place], [address] [place], [place] [address]   Culture, blood (Routine X 2) w Reflex to ID Panel     Status: None (Preliminary result)   Collection Time: 07/18/19  4:33 AM   Specimen: BLOOD  Result Value Ref Range Status   Specimen Description BLOOD DIALYSIS PORT  Final   Special Requests  Final    BOTTLES DRAWN AEROBIC AND ANAEROBIC Blood Culture adequate volume   Culture   Final    NO GROWTH 2 DAYS Performed at [facility], [address] [place], [place] [address]    Report Status PENDING  Incomplete  Culture, blood (Routine X 2) w Reflex to ID Panel     Status: None (Preliminary result)   Collection Time: 07/18/19  4:33 AM   Specimen: BLOOD  Result Value Ref Range Status   Specimen Description BLOOD BLOOD RIGHT HAND  Final   Special Requests   Final    BOTTLES DRAWN AEROBIC AND ANAEROBIC Blood Culture adequate volume   Culture   Final    NO GROWTH 2 DAYS Performed at [facility], [address], [place], [place] [address]    Report Status PENDING  Incomplete     Scheduled Meds: . amLODipine  10 mg Oral Daily  . aspirin EC  81 mg Oral Daily  . calcium acetate  1,334 mg Oral TID WC  . Chlorhexidine Gluconate Cloth  6 each Topical Q0600  . dexamethasone  6 mg Oral Q24H  . epoetin (EPOGEN/PROCRIT) injection  4,000 Units Intravenous Q T,Th,Sa-HD  . famotidine  20 mg Oral QODAY  . folic acid  1 mg Oral Daily  . heparin injection (subcutaneous)  7,500 Units Subcutaneous Q8H  . insulin aspart  0-5 Units Subcutaneous QHS  .  insulin aspart  0-9 Units Subcutaneous TID WC  . Ipratropium-Albuterol  1 puff Inhalation TID  . metoprolol succinate  50 mg Oral Daily  . rOPINIRole  1 mg Oral TID  . thiamine  100 mg Oral Daily  . vitamin C  500 mg Oral Daily  . zinc sulfate  220 mg Oral Daily   Continuous Infusions: . remdesivir 100 mg in NS 100 mL      Assessment/Plan:  1. COVID-19 pneumonia versus CHF.  On remdesivir (day 3), Decadron, zinc, vitamin C.  Continue to monitor inflammatory markers. 2. Acute hypoxic respiratory failure secondary to COVID-19 pneumonia and pulmonary edema.  Try to taper off oxygen and check pulse ox on room air with ambulation since doing better. 3. Fluid overload.  Patient receiving dialysis again today. 4. End-stage renal disease with hyperkalemia.  Patient receiving dialysis today. 5. Essential hypertension on amlodipine, metoprolol. 6. GERD on PPI 7. Restless leg syndrome on Requip 8. Type 2 diabetes with complication of end-stage renal disease.  On sliding scale for now.  Last hemoglobin A1c 6.5.  May not need as much insulin coverage upon going home. 9. Patient asking for his right foot to be wrapped.  We will put a dry dressing.  No signs of infection over scab on right foot.  Code Status:     Code Status Orders  (From admission, onward)         Start     Ordered   07/18/19 0231  Full code  Continuous     07/18/19 0234        Code Status History    Date Active Date Inactive Code Status Order ID Comments User Context   06/13/2019 0510 06/15/2019 1841 Full Code [id number]  Sidney Ace Arvella Merles, MD ED   06/24/2017 2228 06/25/2017 1441 Full Code [id number]  Fritzi Mandes, MD Inpatient   Advance Care Planning Activity     Family Communication: Tried to call wife on her cell phone but left a message Disposition Plan: Likely will finish up at least 5 days of remdesivir  here in the hospital.  Consultants:  Nephrology  Antibiotics:  Remdesivir  Time spent: 28 minutes  [place]

## 2019-07-20 NOTE — Progress Notes (Signed)
[place], [place] 07/20/19  Subjective:   Hospital day # 2 Patient presented with high BP and SOB Emergent HD on Monday night, 2500 cc removed COVID-19  positive  Neuro: Alert, no complaints Blood pressure control and pulmonary symptoms have improved  Renal: Seen before starting dialysis.  Seems to be back to his baseline health.      Objective:  Vital signs in last 24 hours:  Temp:  [98 F (36.7 C)-98.8 F (37.1 C)] 98 F (36.7 C) (12/10 0753) Pulse Rate:  [65-69] 65 (12/10 0753) Resp:  [16-24] 16 (12/10 0753) BP: (146-160)/(77-88) 159/83 (12/10 0753) SpO2:  [95 %-100 %] 95 % (12/10 0753) Weight:  [115.9 kg-116.8 kg] 115.9 kg (12/10 0546)  Weight change:  Filed Weights   07/18/19 0335 07/19/19 1631 07/20/19 0546  Weight: 118 kg 116.8 kg 115.9 kg    Intake/Output:    Intake/Output Summary (Last 24 hours) at 07/20/2019 3532 Last data filed at 07/19/2019 1810 Gross per 24 hour  Intake -  Output 0 ml  Net 0 ml    Exam: General appearance: No acute distress, ambulatory in the room HEENT: Anicteric, moist oral mucous membranes Pulmonary: Clear to auscultation, Cardiovascular: Regular, no rub or gallop Abdomen: Soft, nontender Extremities: No edema Neuro: Alert and oriented    Basic Metabolic Panel:  Recent Labs  Lab 07/18/19 0049 07/18/19 0249 07/18/19 0952 07/19/19 0505 07/19/19 1824 07/20/19 0617  NA 138 136 137 134*  --  135  K 6.6* 6.1* 4.8 5.0  --  5.7*  CL 98 96* 97* 95*  --  94*  CO2 22 21* 21* 20*  --  19*  GLUCOSE 140* 214* 132* 121*  --  98  BUN 99* 106* 78* 92*  --  116*  CREATININE 18.77* 18.67* 15.34* 17.48*  --  19.79*  CALCIUM 7.5* 7.3* 7.8* 7.3*  --  7.5*  MG  --   --   --  2.2  --  2.5*  PHOS  --   --   --  8.9* 10.7* 10.9*     CBC: Recent Labs  Lab 07/18/19 0049 07/18/19 0249 07/19/19 0505 07/20/19 0617  WBC 8.9 11.4* 7.8 6.1  NEUTROABS 6.1  --  6.2 4.4  HGB 11.1* 12.1* 9.9* 9.5*   HCT 33.8* 36.0* 29.2* 28.1*  MCV 95.2 91.8 90.7 90.1  PLT 177 194 138* 140*      Lab Results  Component Value Date   HEPBSAG NON REACTIVE 07/18/2019      Microbiology:  Recent Results (from the past 240 hour(s))  Respiratory Panel by RT PCR (Flu A&B, Covid) - Nasopharyngeal Swab     Status: Abnormal   Collection Time: 07/18/19  1:03 AM   Specimen: Nasopharyngeal Swab  Result Value Ref Range Status   SARS Coronavirus 2 by RT PCR POSITIVE (A) NEGATIVE Final    Comment: RESULT CALLED TO, READ BACK BY AND VERIFIED WITH: Georgann Housekeeper RN [id number] 07/18/2019 HNM (NOTE) SARS-CoV-2 target nucleic acids are DETECTED. SARS-CoV-2 RNA is generally detectable in upper respiratory specimens  during the acute phase of infection. Positive results are indicative of the presence of the identified virus, but do not rule out bacterial infection or co-infection with other pathogens not detected by the test. Clinical correlation with patient history and other diagnostic information is necessary to determine patient infection status. The expected result is Negative. Fact Sheet for Patients:  PinkCheek.be Fact Sheet for Healthcare Providers: GravelBags.it This test is not yet  approved or cleared by the [place] and  has been authorized for detection and/or diagnosis of SARS-CoV-2 by FDA under an Emergency Use Authorization (EUA).  This EUA will remain in effect (meaning this test can be used) fo r the duration of  the COVID-19 declaration under Section 564(b)(1) of the Act, 21 U.S.C. section 360bbb-3(b)(1), unless the authorization is terminated or revoked sooner.    Influenza A by PCR NEGATIVE NEGATIVE Final   Influenza B by PCR NEGATIVE NEGATIVE Final    Comment: (NOTE) The Xpert Xpress SARS-CoV-2/FLU/RSV assay is intended as an aid in  the diagnosis of influenza from Nasopharyngeal swab specimens and  should not be used as a  sole basis for treatment. Nasal washings and  aspirates are unacceptable for Xpert Xpress SARS-CoV-2/FLU/RSV  testing. Fact Sheet for Patients: PinkCheek.be Fact Sheet for Healthcare Providers: GravelBags.it This test is not yet approved or cleared by the [place] FDA and  has been authorized for detection and/or diagnosis of SARS-CoV-2 by  FDA under an Emergency Use Authorization (EUA). This EUA will remain  in effect (meaning this test can be used) for the duration of the  Covid-19 declaration under Section 564(b)(1) of the Act, 21  U.S.C. section 360bbb-3(b)(1), unless the authorization is  terminated or revoked. Performed at [facility], [address] [place], [place] [address]   Culture, blood (Routine X 2) w Reflex to ID Panel     Status: None (Preliminary result)   Collection Time: 07/18/19  4:33 AM   Specimen: BLOOD  Result Value Ref Range Status   Specimen Description BLOOD DIALYSIS PORT  Final   Special Requests   Final    BOTTLES DRAWN AEROBIC AND ANAEROBIC Blood Culture adequate volume   Culture   Final    NO GROWTH 2 DAYS Performed at [facility], [address], [place], [place] [address]    Report Status PENDING  Incomplete  Culture, blood (Routine X 2) w Reflex to ID Panel     Status: None (Preliminary result)   Collection Time: 07/18/19  4:33 AM   Specimen: BLOOD  Result Value Ref Range Status   Specimen Description BLOOD BLOOD RIGHT HAND  Final   Special Requests   Final    BOTTLES DRAWN AEROBIC AND ANAEROBIC Blood Culture adequate volume   Culture   Final    NO GROWTH 2 DAYS Performed at [facility], [address] [place], [place] [address]    Report Status PENDING  Incomplete    Coagulation Studies: Recent Labs    07/18/19 0433  LABPROT 14.2  INR 1.1    Urinalysis: No results for input(s): COLORURINE, LABSPEC, PHURINE, GLUCOSEU, HGBUR, BILIRUBINUR,  KETONESUR, PROTEINUR, UROBILINOGEN, NITRITE, LEUKOCYTESUR in the last 72 hours.  Invalid input(s): APPERANCEUR    Imaging: No results found.   Medications:   . remdesivir 100 mg in NS 100 mL     . amLODipine  10 mg Oral Daily  . aspirin EC  81 mg Oral Daily  . calcium acetate  1,334 mg Oral TID WC  . Chlorhexidine Gluconate Cloth  6 each Topical Q0600  . dexamethasone  6 mg Oral Q24H  . epoetin (EPOGEN/PROCRIT) injection  4,000 Units Intravenous Q T,Th,Sa-HD  . famotidine  20 mg Oral QODAY  . folic acid  1 mg Oral Daily  . heparin injection (subcutaneous)  7,500 Units Subcutaneous Q8H  . insulin aspart  0-5 Units Subcutaneous QHS  . insulin aspart  0-9 Units  Subcutaneous TID WC  . Ipratropium-Albuterol  1 puff Inhalation TID  . metoprolol succinate  50 mg Oral Daily  . rOPINIRole  1 mg Oral TID  . thiamine  100 mg Oral Daily  . vitamin C  500 mg Oral Daily  . zinc sulfate  220 mg Oral Daily   acetaminophen, albuterol, calcium carbonate, heparin, ondansetron (ZOFRAN) IV, oxyCODONE-acetaminophen  Assessment/ Plan:  63 y.o. African American male with past medical history of ESRD on HD TTS, diabetes mellitus type 2, hypertension, anemia chronic kidney disease, secondary hyperparathyroidism who was admitted with shortness of breath and tested positive for COVID-19  [facility] Nephrology/[address]/TTHS/EDW 114  1. ESRD on HD TTS/hyperkalemia.    Emergent HD upon admission on 07/18/2019 2500 cc removed Next hemodialysis planned for today.  2.  Acute respiratory failure.    COVID positive Treatment as per ICU/hospitalist team Symptoms improved Remdesivir day #3.  3.  Secondary hyperparathyroidism.   Calcium acetate continued  4.  Anemia chronic kidney disease.   Lab Results  Component Value Date   HGB 9.5 (L) 07/20/2019  monitor Epogen with hemodialysis   LOS: 2 Janique Hoefer 12/10/20209:22 AM  [place],  [place]  Note: This note was prepared with Dragon dictation. Any transcription errors are unintentional

## 2019-07-20 NOTE — Progress Notes (Signed)
   07/20/19 1318  Neurological  Level of Consciousness Alert  Orientation Level Oriented X4  Respiratory  Respiratory Pattern Regular  Bilateral Breath Sounds Diminished  Cough Non-productive  Cardiac  Pulse Regular  Heart Sounds S1, S2  pt stable for d/c avf+/+ ufg achieved 1.5l

## 2019-07-21 LAB — CBC WITH DIFFERENTIAL/PLATELET
Abs Immature Granulocytes: 0.03 10*3/uL (ref 0.00–0.07)
Basophils Absolute: 0 10*3/uL (ref 0.0–0.1)
Basophils Relative: 0 %
Eosinophils Absolute: 0 10*3/uL (ref 0.0–0.5)
Eosinophils Relative: 0 %
HCT: 28.9 % — ABNORMAL LOW (ref 39.0–52.0)
Hemoglobin: 9.8 g/dL — ABNORMAL LOW (ref 13.0–17.0)
Immature Granulocytes: 1 %
Lymphocytes Relative: 14 %
Lymphs Abs: 0.6 10*3/uL — ABNORMAL LOW (ref 0.7–4.0)
MCH: 30.4 pg (ref 26.0–34.0)
MCHC: 33.9 g/dL (ref 30.0–36.0)
MCV: 89.8 fL (ref 80.0–100.0)
Monocytes Absolute: 0.3 10*3/uL (ref 0.1–1.0)
Monocytes Relative: 7 %
Neutro Abs: 3.2 10*3/uL (ref 1.7–7.7)
Neutrophils Relative %: 78 %
Platelets: 149 10*3/uL — ABNORMAL LOW (ref 150–400)
RBC: 3.22 MIL/uL — ABNORMAL LOW (ref 4.22–5.81)
RDW: 13.3 % (ref 11.5–15.5)
WBC: 4.1 10*3/uL (ref 4.0–10.5)
nRBC: 0 % (ref 0.0–0.2)

## 2019-07-21 LAB — COMPREHENSIVE METABOLIC PANEL
ALT: 29 U/L (ref 0–44)
AST: 20 U/L (ref 15–41)
Albumin: 3.2 g/dL — ABNORMAL LOW (ref 3.5–5.0)
Alkaline Phosphatase: 66 U/L (ref 38–126)
Anion gap: 19 — ABNORMAL HIGH (ref 5–15)
BUN: 83 mg/dL — ABNORMAL HIGH (ref 8–23)
CO2: 25 mmol/L (ref 22–32)
Calcium: 7.2 mg/dL — ABNORMAL LOW (ref 8.9–10.3)
Chloride: 93 mmol/L — ABNORMAL LOW (ref 98–111)
Creatinine, Ser: 14.64 mg/dL — ABNORMAL HIGH (ref 0.61–1.24)
GFR calc Af Amer: 4 mL/min — ABNORMAL LOW (ref >60)
GFR calc non Af Amer: 3 mL/min — ABNORMAL LOW (ref >60)
Glucose, Bld: 140 mg/dL — ABNORMAL HIGH (ref 70–99)
Potassium: 4.9 mmol/L (ref 3.5–5.1)
Sodium: 137 mmol/L (ref 135–145)
Total Bilirubin: 0.7 mg/dL (ref 0.3–1.2)
Total Protein: 7.2 g/dL (ref 6.5–8.1)

## 2019-07-21 LAB — FIBRIN DERIVATIVES D-DIMER (ARMC ONLY): Fibrin derivatives D-dimer (ARMC): 969.08 ng/mL (FEU) — ABNORMAL HIGH (ref 0.00–499.00)

## 2019-07-21 LAB — GLUCOSE, CAPILLARY
Glucose-Capillary: 122 mg/dL — ABNORMAL HIGH (ref 70–99)
Glucose-Capillary: 165 mg/dL — ABNORMAL HIGH (ref 70–99)
Glucose-Capillary: 172 mg/dL — ABNORMAL HIGH (ref 70–99)
Glucose-Capillary: 213 mg/dL — ABNORMAL HIGH (ref 70–99)

## 2019-07-21 LAB — PHOSPHORUS: Phosphorus: 7.9 mg/dL — ABNORMAL HIGH (ref 2.5–4.6)

## 2019-07-21 LAB — FERRITIN: Ferritin: 1369 ng/mL — ABNORMAL HIGH (ref 24–336)

## 2019-07-21 LAB — C-REACTIVE PROTEIN: CRP: 7.8 mg/dL — ABNORMAL HIGH (ref <1.0)

## 2019-07-21 LAB — MAGNESIUM: Magnesium: 2 mg/dL (ref 1.7–2.4)

## 2019-07-21 MED ORDER — SODIUM CHLORIDE 0.9 % IV SOLN
100.0000 mg | Freq: Every day | INTRAVENOUS | Status: AC
  Administered 2019-07-22: 100 mg via INTRAVENOUS
  Filled 2019-07-21: qty 100
  Filled 2019-07-21: qty 20

## 2019-07-21 NOTE — Progress Notes (Signed)
Patient ID: Donald Silva, male   DOB: [birth year], 63 y.o.   MRN: [id number] [facility] PROGRESS NOTE  Donald Silva JQZ:[id number] DOB: 08-17-1955 DOA: 07/18/2019 PCP: Letta Median, MD  HPI/Subjective: Patient feels great and wanted to go home today.  He is on day 4 of remdesivir.  He offers no complaints.  He is feeling fine.  Objective: Vitals:   07/21/19 0333 07/21/19 0845  BP: (!) 156/79 (!) 145/90  Pulse: 70 69  Resp: 16 16  Temp: 98.2 F (36.8 C) 98 F (36.7 C)  SpO2: 100% 95%   No intake or output data in the 24 hours ending 07/21/19 1354 Filed Weights   07/19/19 1631 07/20/19 0546 07/21/19 0333  Weight: 116.8 kg 115.9 kg 114 kg    ROS: Review of Systems  Constitutional: Negative for chills and fever.  Eyes: Negative for blurred vision.  Respiratory: Negative for cough and shortness of breath.   Cardiovascular: Negative for chest pain.  Gastrointestinal: Negative for abdominal pain, constipation, diarrhea, nausea and vomiting.  Genitourinary: Negative for dysuria.  Musculoskeletal: Negative for joint pain.  Neurological: Negative for dizziness and headaches.   Exam: Physical Exam  Constitutional: He is oriented to person, place, and time.  HENT:  Nose: No mucosal edema.  Mouth/Throat: No oropharyngeal exudate or posterior oropharyngeal edema.  Eyes: Pupils are equal, round, and reactive to light. Conjunctivae, EOM and lids are normal.  Neck: No JVD present. Carotid bruit is not present. No thyroid mass and no thyromegaly present.  Cardiovascular: S1 normal and S2 normal. Exam reveals no gallop.  No murmur heard. Pulses:      Dorsalis pedis pulses are 2+ on the right side and 2+ on the left side.  Respiratory: No respiratory distress. He has decreased breath sounds in the right lower field and the left lower field. He has no wheezes. He has no rhonchi. He has no rales.  GI: Soft. Bowel sounds are normal. There is no abdominal tenderness.   Musculoskeletal:     Cervical back: No edema.     Right ankle: No swelling.     Left ankle: No swelling.  Lymphadenopathy:    He has no cervical adenopathy.  Neurological: He is alert and oriented to person, place, and time. No cranial nerve deficit.  Skin: Skin is warm. No rash noted. Nails show no clubbing.  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  No signs of infection  Psychiatric: He has a normal mood and affect.      Data Reviewed: Basic Metabolic Panel: Recent Labs  Lab 07/18/19 0249 07/18/19 0952 07/19/19 0505 07/19/19 1824 07/20/19 0617 07/21/19 0535  NA 136 137 134*  --  135 137  K 6.1* 4.8 5.0  --  5.7* 4.9  CL 96* 97* 95*  --  94* 93*  CO2 21* 21* 20*  --  19* 25  GLUCOSE 214* 132* 121*  --  98 140*  BUN 106* 78* 92*  --  116* 83*  CREATININE 18.67* 15.34* 17.48*  --  19.79* 14.64*  CALCIUM 7.3* 7.8* 7.3*  --  7.5* 7.2*  MG  --   --  2.2  --  2.5* 2.0  PHOS  --   --  8.9* 10.7* 10.9* 7.9*   Liver Function Tests: Recent Labs  Lab 07/18/19 0049 07/18/19 0249 07/19/19 0505 07/20/19 0617 07/21/19 0535  AST 67* 64* 30 23 20   ALT 80* 82* 47* 38 29  ALKPHOS 117 128* 81  72 66  BILITOT 0.6 0.5 0.6 0.7 0.7  PROT 8.7* 9.3* 7.4 7.3 7.2  ALBUMIN 4.4 4.5 3.5 3.4* 3.2*   CBC: Recent Labs  Lab 07/18/19 0049 07/18/19 0249 07/19/19 0505 07/20/19 0617 07/21/19 0535  WBC 8.9 11.4* 7.8 6.1 4.1  NEUTROABS 6.1  --  6.2 4.4 3.2  HGB 11.1* 12.1* 9.9* 9.5* 9.8*  HCT 33.8* 36.0* 29.2* 28.1* 28.9*  MCV 95.2 91.8 90.7 90.1 89.8  PLT 177 194 138* 140* 149*   BNP (last 3 results) Recent Labs    07/18/19 0049  BNP 902.0*    CBG: Recent Labs  Lab 07/20/19 1350 07/20/19 1620 07/20/19 2219 07/21/19 0839 07/21/19 1201  GLUCAP 126* 136* 182* 122* 165*    Recent Results (from the past 240 hour(s))  Respiratory Panel by RT PCR (Flu A&B, Covid) - Nasopharyngeal Swab     Status: Abnormal   Collection Time: 07/18/19  1:03 AM   Specimen:  Nasopharyngeal Swab  Result Value Ref Range Status   SARS Coronavirus 2 by RT PCR POSITIVE (A) NEGATIVE Final    Comment: RESULT CALLED TO, READ BACK BY AND VERIFIED WITH: Georgann Housekeeper RN [phone] 07/18/2019 HNM (NOTE) SARS-CoV-2 target nucleic acids are DETECTED. SARS-CoV-2 RNA is generally detectable in upper respiratory specimens  during the acute phase of infection. Positive results are indicative of the presence of the identified virus, but do not rule out bacterial infection or co-infection with other pathogens not detected by the test. Clinical correlation with patient history and other diagnostic information is necessary to determine patient infection status. The expected result is Negative. Fact Sheet for Patients:  PinkCheek.be Fact Sheet for Healthcare Providers: GravelBags.it This test is not yet approved or cleared by the [place] FDA and  has been authorized for detection and/or diagnosis of SARS-CoV-2 by FDA under an Emergency Use Authorization (EUA).  This EUA will remain in effect (meaning this test can be used) fo r the duration of  the COVID-19 declaration under Section 564(b)(1) of the Act, 21 U.S.C. section 360bbb-3(b)(1), unless the authorization is terminated or revoked sooner.    Influenza A by PCR NEGATIVE NEGATIVE Final   Influenza B by PCR NEGATIVE NEGATIVE Final    Comment: (NOTE) The Xpert Xpress SARS-CoV-2/FLU/RSV assay is intended as an aid in  the diagnosis of influenza from Nasopharyngeal swab specimens and  should not be used as a sole basis for treatment. Nasal washings and  aspirates are unacceptable for Xpert Xpress SARS-CoV-2/FLU/RSV  testing. Fact Sheet for Patients: PinkCheek.be Fact Sheet for Healthcare Providers: GravelBags.it This test is not yet approved or cleared by the [place] FDA and  has been authorized for  detection and/or diagnosis of SARS-CoV-2 by  FDA under an Emergency Use Authorization (EUA). This EUA will remain  in effect (meaning this test can be used) for the duration of the  Covid-19 declaration under Section 564(b)(1) of the Act, 21  U.S.C. section 360bbb-3(b)(1), unless the authorization is  terminated or revoked. Performed at [facility], [address] [place], [place] [address]   Culture, blood (Routine X 2) w Reflex to ID Panel     Status: None (Preliminary result)   Collection Time: 07/18/19  4:33 AM   Specimen: BLOOD  Result Value Ref Range Status   Specimen Description BLOOD DIALYSIS PORT  Final   Special Requests   Final    BOTTLES DRAWN AEROBIC AND ANAEROBIC Blood Culture adequate volume   Culture   Final  NO GROWTH 3 DAYS Performed at [facility], [address] [place], [place] [address]    Report Status PENDING  Incomplete  Culture, blood (Routine X 2) w Reflex to ID Panel     Status: None (Preliminary result)   Collection Time: 07/18/19  4:33 AM   Specimen: BLOOD  Result Value Ref Range Status   Specimen Description BLOOD BLOOD RIGHT HAND  Final   Special Requests   Final    BOTTLES DRAWN AEROBIC AND ANAEROBIC Blood Culture adequate volume   Culture   Final    NO GROWTH 3 DAYS Performed at [facility], [address] [place], [place] [address]    Report Status PENDING  Incomplete     Scheduled Meds: . amLODipine  10 mg Oral Daily  . aspirin EC  81 mg Oral Daily  . calcium acetate  1,334 mg Oral TID WC  . Chlorhexidine Gluconate Cloth  6 each Topical Q0600  . dexamethasone  6 mg Oral Q24H  . epoetin (EPOGEN/PROCRIT) injection  4,000 Units Intravenous Q T,Th,Sa-HD  . famotidine  20 mg Oral QODAY  . folic acid  1 mg Oral Daily  . heparin injection (subcutaneous)  7,500 Units Subcutaneous Q8H  . insulin aspart  0-5 Units Subcutaneous QHS  . insulin aspart  0-9 Units Subcutaneous TID WC  . Ipratropium-Albuterol  1  puff Inhalation TID  . metoprolol succinate  50 mg Oral Daily  . rOPINIRole  1 mg Oral TID  . thiamine  100 mg Oral Daily  . vitamin C  500 mg Oral Daily  . zinc sulfate  220 mg Oral Daily   Continuous Infusions: . [START ON 07/22/2019] remdesivir 100 mg in NS 100 mL      Assessment/Plan:  1. COVID-19 pneumonia versus CHF.  On remdesivir (day 4), Decadron, zinc, vitamin C.  Continue to monitor inflammatory markers.  Likely discharge tomorrow after fifth dose of remdesivir. 2. Acute hypoxic respiratory failure secondary to COVID-19 pneumonia and pulmonary edema.  Patient now off oxygen breathing comfortably. 3. Fluid overload.  Patient received dialysis yesterday.  Will have dialysis as outpatient tomorrow. 4. End-stage renal disease with hyperkalemia. 5. Essential hypertension on amlodipine, metoprolol. 6. GERD on PPI 7. Restless leg syndrome on Requip 8. Type 2 diabetes with complication of end-stage renal disease.  On sliding scale for now.  Last hemoglobin A1c 6.5.  May not need as much insulin coverage upon going home. 9. Insomnia.  Cannot sleep with the noise in the room  Code Status:     Code Status Orders  (From admission, onward)         Start     Ordered   07/18/19 0231  Full code  Continuous     07/18/19 0234        Code Status History    Date Active Date Inactive Code Status Order ID Comments User Context   06/13/2019 0510 06/15/2019 1841 Full Code [id number]  Sidney Ace Arvella Merles, MD ED   06/24/2017 2228 06/25/2017 1441 Full Code [id number]  Fritzi Mandes, MD Inpatient   Advance Care Planning Activity     Family Communication: Tried to call wife on her cell phone but left a message Disposition Plan: We will discharge home after fifth dose of remdesivir tomorrow morning  Consultants:  Nephrology  Antibiotics:  Remdesivir  Time spent: 26 minutes  [place]

## 2019-07-21 NOTE — Plan of Care (Signed)
  Problem: Education: Goal: Knowledge of General Education information will improve Description: Including pain rating scale, medication(s)/side effects and non-pharmacologic comfort measures Outcome: Progressing   Problem: Health Behavior/Discharge Planning: Goal: Ability to manage health-related needs will improve Outcome: Progressing   Problem: Clinical Measurements: Goal: Ability to maintain clinical measurements within normal limits will improve Outcome: Not Progressing Note: Patient's creatinine level is elevated at greater than 14. Patient is an HD patient T/Th/S. To d/c in the AM. Will continue to monitor overall progression for the remainder of the shift. Wenda Low [facility]

## 2019-07-21 NOTE — Progress Notes (Signed)
Ch contacted pt via telephone to see how well pt was progressing. Pt is a 63 y.o. male that is being treated for COVID+. Pt shared that he came to the ER because he c/o not being able to breath. Pt breathing presented to be a bit labored while speaking with pt over the phone but pt shared that he has improved since admission. Ch provided words of comfort to pt.    07/21/19 1400  Clinical Encounter Type  Visited With Patient  Visit Type Social support;Critical Care  Spiritual Encounters  Spiritual Needs Grief support;Emotional  Stress Factors  Patient Stress Factors Exhausted;Health changes;Major life changes  Family Stress Factors None identified

## 2019-07-21 NOTE — Progress Notes (Addendum)
New dialysis plan, patient will treat at home clinic on COVID shift starting tomorrow at 3:30pm. COVID chair time TTS 3:30pm, patient has been instructed to wait in car until nurse comes to get him. Patient states that he will get there either by himself or wife. Please contact me with any questions.

## 2019-07-21 NOTE — Progress Notes (Signed)
[place], [place] 07/21/19  Subjective:   Hospital day # 3 Patient presented with high BP and SOB Emergent HD on Monday night, 2500 cc removed COVID-19  positive  Case discussed with nursing staff.  No acute complaints      Objective:  Vital signs in last 24 hours:  Temp:  [97.9 F (36.6 C)-98.6 F (37 C)] 98 F (36.7 C) (12/11 0845) Pulse Rate:  [69-75] 69 (12/11 0845) Resp:  [16-24] 16 (12/11 0845) BP: (145-170)/(79-100) 145/90 (12/11 0845) SpO2:  [94 %-100 %] 95 % (12/11 0845) Weight:  [683 kg] 114 kg (12/11 0333)  Weight change: -2.767 kg Filed Weights   07/19/19 1631 07/20/19 0546 07/21/19 0333  Weight: 116.8 kg 115.9 kg 114 kg    Intake/Output:   No intake or output data in the 24 hours ending 07/21/19 1445  Exam: Deferred due to Covid positive status to preserve PPE and minimize exposure    Basic Metabolic Panel:  Recent Labs  Lab 07/18/19 0249 07/18/19 0952 07/19/19 0505 07/19/19 1824 07/20/19 0617 07/21/19 0535  NA 136 137 134*  --  135 137  K 6.1* 4.8 5.0  --  5.7* 4.9  CL 96* 97* 95*  --  94* 93*  CO2 21* 21* 20*  --  19* 25  GLUCOSE 214* 132* 121*  --  98 140*  BUN 106* 78* 92*  --  116* 83*  CREATININE 18.67* 15.34* 17.48*  --  19.79* 14.64*  CALCIUM 7.3* 7.8* 7.3*  --  7.5* 7.2*  MG  --   --  2.2  --  2.5* 2.0  PHOS  --   --  8.9* 10.7* 10.9* 7.9*     CBC: Recent Labs  Lab 07/18/19 0049 07/18/19 0249 07/19/19 0505 07/20/19 0617 07/21/19 0535  WBC 8.9 11.4* 7.8 6.1 4.1  NEUTROABS 6.1  --  6.2 4.4 3.2  HGB 11.1* 12.1* 9.9* 9.5* 9.8*  HCT 33.8* 36.0* 29.2* 28.1* 28.9*  MCV 95.2 91.8 90.7 90.1 89.8  PLT 177 194 138* 140* 149*      Lab Results  Component Value Date   HEPBSAG NON REACTIVE 07/18/2019      Microbiology:  Recent Results (from the past 240 hour(s))  Respiratory Panel by RT PCR (Flu A&B, Covid) - Nasopharyngeal Swab     Status: Abnormal   Collection Time: 07/18/19  1:03 AM    Specimen: Nasopharyngeal Swab  Result Value Ref Range Status   SARS Coronavirus 2 by RT PCR POSITIVE (A) NEGATIVE Final    Comment: RESULT CALLED TO, READ BACK BY AND VERIFIED WITH: Georgann Housekeeper RN [phone] 07/18/2019 HNM (NOTE) SARS-CoV-2 target nucleic acids are DETECTED. SARS-CoV-2 RNA is generally detectable in upper respiratory specimens  during the acute phase of infection. Positive results are indicative of the presence of the identified virus, but do not rule out bacterial infection or co-infection with other pathogens not detected by the test. Clinical correlation with patient history and other diagnostic information is necessary to determine patient infection status. The expected result is Negative. Fact Sheet for Patients:  PinkCheek.be Fact Sheet for Healthcare Providers: GravelBags.it This test is not yet approved or cleared by the [place] FDA and  has been authorized for detection and/or diagnosis of SARS-CoV-2 by FDA under an Emergency Use Authorization (EUA).  This EUA will remain in effect (meaning this test can be used) fo r the duration of  the COVID-19 declaration under Section 564(b)(1) of the Act, 21 U.S.C.  section 360bbb-3(b)(1), unless the authorization is terminated or revoked sooner.    Influenza A by PCR NEGATIVE NEGATIVE Final   Influenza B by PCR NEGATIVE NEGATIVE Final    Comment: (NOTE) The Xpert Xpress SARS-CoV-2/FLU/RSV assay is intended as an aid in  the diagnosis of influenza from Nasopharyngeal swab specimens and  should not be used as a sole basis for treatment. Nasal washings and  aspirates are unacceptable for Xpert Xpress SARS-CoV-2/FLU/RSV  testing. Fact Sheet for Patients: PinkCheek.be Fact Sheet for Healthcare Providers: GravelBags.it This test is not yet approved or cleared by the [place] FDA and  has been  authorized for detection and/or diagnosis of SARS-CoV-2 by  FDA under an Emergency Use Authorization (EUA). This EUA will remain  in effect (meaning this test can be used) for the duration of the  Covid-19 declaration under Section 564(b)(1) of the Act, 21  U.S.C. section 360bbb-3(b)(1), unless the authorization is  terminated or revoked. Performed at [facility], [address] [place], [place] [address]   Culture, blood (Routine X 2) w Reflex to ID Panel     Status: None (Preliminary result)   Collection Time: 07/18/19  4:33 AM   Specimen: BLOOD  Result Value Ref Range Status   Specimen Description BLOOD DIALYSIS PORT  Final   Special Requests   Final    BOTTLES DRAWN AEROBIC AND ANAEROBIC Blood Culture adequate volume   Culture   Final    NO GROWTH 3 DAYS Performed at [facility], [address] [place], [place] [address]    Report Status PENDING  Incomplete  Culture, blood (Routine X 2) w Reflex to ID Panel     Status: None (Preliminary result)   Collection Time: 07/18/19  4:33 AM   Specimen: BLOOD  Result Value Ref Range Status   Specimen Description BLOOD BLOOD RIGHT HAND  Final   Special Requests   Final    BOTTLES DRAWN AEROBIC AND ANAEROBIC Blood Culture adequate volume   Culture   Final    NO GROWTH 3 DAYS Performed at [facility], [address] [place], [place] [address]    Report Status PENDING  Incomplete    Coagulation Studies: No results for input(s): LABPROT, INR in the last 72 hours.  Urinalysis: No results for input(s): COLORURINE, LABSPEC, PHURINE, GLUCOSEU, HGBUR, BILIRUBINUR, KETONESUR, PROTEINUR, UROBILINOGEN, NITRITE, LEUKOCYTESUR in the last 72 hours.  Invalid input(s): APPERANCEUR    Imaging: No results found.   Medications:   . [START ON 07/22/2019] remdesivir 100 mg in NS 100 mL     . amLODipine  10 mg Oral Daily  . aspirin EC  81 mg Oral Daily  . calcium acetate  1,334 mg Oral TID WC  .  Chlorhexidine Gluconate Cloth  6 each Topical Q0600  . dexamethasone  6 mg Oral Q24H  . epoetin (EPOGEN/PROCRIT) injection  4,000 Units Intravenous Q T,Th,Sa-HD  . famotidine  20 mg Oral QODAY  . folic acid  1 mg Oral Daily  . heparin injection (subcutaneous)  7,500 Units Subcutaneous Q8H  . insulin aspart  0-5 Units Subcutaneous QHS  . insulin aspart  0-9 Units Subcutaneous TID WC  . Ipratropium-Albuterol  1 puff Inhalation TID  . metoprolol succinate  50 mg Oral Daily  . rOPINIRole  1 mg Oral TID  . thiamine  100 mg Oral Daily  . vitamin C  500 mg Oral Daily  . zinc sulfate  220 mg Oral Daily   acetaminophen, albuterol,  calcium carbonate, heparin, ondansetron (ZOFRAN) IV, oxyCODONE-acetaminophen  Assessment/ Plan:  63 y.o. African American male with past medical history of ESRD on HD TTS, diabetes mellitus type 2, hypertension, anemia chronic kidney disease, secondary hyperparathyroidism who was admitted with shortness of breath and tested positive for COVID-19  [facility] Nephrology/[address]/TTHS/EDW 114  1. ESRD on HD TTS/hyperkalemia.    Emergent HD upon admission on 07/18/2019 2500 cc removed Next hemodialysis planned for Saturday -Patient is set up as outpatient on Covid positive shift TTS 3:30 PM.  2.  Acute respiratory failure.    COVID positive Treatment as per ICU/hospitalist team Symptoms improved Remdesivir IV, oral dexamethasone  3.  Secondary hyperparathyroidism.   Calcium acetate continued  4.  Anemia chronic kidney disease.   Lab Results  Component Value Date   HGB 9.8 (L) 07/21/2019  monitor Epogen with hemodialysis   LOS: 3 Synia Douglass 12/11/20202:45 PM  [place], [place]  Note: This note was prepared with Dragon dictation. Any transcription errors are unintentional

## 2019-07-21 NOTE — Progress Notes (Signed)
Patient refused wound care to toe for today, saying it doesn't need done again until the AM. Will pass along in shift report and monitor site for the remainder of the shift. Donald Silva [facility]

## 2019-07-21 NOTE — Care Management Important Message (Signed)
Important Message  Patient Details  Name: Donald Silva MRN: [id number] Date of Birth: [birth year]   Medicare Important Message Given:  Other (see comment)  Attempted to review Medicare IM verbally with patient over room phone, however unable to reach.     Dannette Barbara 07/21/2019, 3:21 PM

## 2019-07-22 LAB — CBC WITH DIFFERENTIAL/PLATELET
Abs Immature Granulocytes: 0.08 10*3/uL — ABNORMAL HIGH (ref 0.00–0.07)
Basophils Absolute: 0 10*3/uL (ref 0.0–0.1)
Basophils Relative: 0 %
Eosinophils Absolute: 0 10*3/uL (ref 0.0–0.5)
Eosinophils Relative: 0 %
HCT: 31 % — ABNORMAL LOW (ref 39.0–52.0)
Hemoglobin: 10.4 g/dL — ABNORMAL LOW (ref 13.0–17.0)
Immature Granulocytes: 1 %
Lymphocytes Relative: 19 %
Lymphs Abs: 1.1 10*3/uL (ref 0.7–4.0)
MCH: 31 pg (ref 26.0–34.0)
MCHC: 33.5 g/dL (ref 30.0–36.0)
MCV: 92.3 fL (ref 80.0–100.0)
Monocytes Absolute: 0.7 10*3/uL (ref 0.1–1.0)
Monocytes Relative: 12 %
Neutro Abs: 3.9 10*3/uL (ref 1.7–7.7)
Neutrophils Relative %: 68 %
Platelets: 181 10*3/uL (ref 150–400)
RBC: 3.36 MIL/uL — ABNORMAL LOW (ref 4.22–5.81)
RDW: 13.3 % (ref 11.5–15.5)
WBC: 5.8 10*3/uL (ref 4.0–10.5)
nRBC: 0 % (ref 0.0–0.2)

## 2019-07-22 LAB — COMPREHENSIVE METABOLIC PANEL
ALT: 28 U/L (ref 0–44)
AST: 22 U/L (ref 15–41)
Albumin: 3.6 g/dL (ref 3.5–5.0)
Alkaline Phosphatase: 76 U/L (ref 38–126)
Anion gap: 19 — ABNORMAL HIGH (ref 5–15)
BUN: 104 mg/dL — ABNORMAL HIGH (ref 8–23)
CO2: 26 mmol/L (ref 22–32)
Calcium: 7.1 mg/dL — ABNORMAL LOW (ref 8.9–10.3)
Chloride: 93 mmol/L — ABNORMAL LOW (ref 98–111)
Creatinine, Ser: 16.06 mg/dL — ABNORMAL HIGH (ref 0.61–1.24)
GFR calc Af Amer: 3 mL/min — ABNORMAL LOW (ref >60)
GFR calc non Af Amer: 3 mL/min — ABNORMAL LOW (ref >60)
Glucose, Bld: 155 mg/dL — ABNORMAL HIGH (ref 70–99)
Potassium: 4.7 mmol/L (ref 3.5–5.1)
Sodium: 138 mmol/L (ref 135–145)
Total Bilirubin: 0.6 mg/dL (ref 0.3–1.2)
Total Protein: 7.9 g/dL (ref 6.5–8.1)

## 2019-07-22 LAB — MAGNESIUM: Magnesium: 2.3 mg/dL (ref 1.7–2.4)

## 2019-07-22 LAB — C-REACTIVE PROTEIN: CRP: 7.2 mg/dL — ABNORMAL HIGH (ref <1.0)

## 2019-07-22 LAB — FIBRIN DERIVATIVES D-DIMER (ARMC ONLY): Fibrin derivatives D-dimer (ARMC): 819.89 ng/mL (FEU) — ABNORMAL HIGH (ref 0.00–499.00)

## 2019-07-22 LAB — PHOSPHORUS: Phosphorus: 7.4 mg/dL — ABNORMAL HIGH (ref 2.5–4.6)

## 2019-07-22 LAB — FERRITIN: Ferritin: 1323 ng/mL — ABNORMAL HIGH (ref 24–336)

## 2019-07-22 MED ORDER — FAMOTIDINE 20 MG PO TABS: 20.0000 mg | ORAL_TABLET | ORAL | 0 refills | Status: DC

## 2019-07-22 MED ORDER — DEXAMETHASONE 2 MG PO TABS: ORAL_TABLET | ORAL | 0 refills | Status: DC

## 2019-07-22 MED ORDER — ASCORBIC ACID 500 MG PO TABS: 500.0000 mg | ORAL_TABLET | Freq: Every day | ORAL | 0 refills | Status: DC

## 2019-07-22 MED ORDER — ZINC SULFATE 220 (50 ZN) MG PO CAPS: 220.0000 mg | ORAL_CAPSULE | Freq: Every day | ORAL | 0 refills | Status: DC

## 2019-07-22 MED ORDER — ACETAMINOPHEN 325 MG PO TABS: 325.0000 mg | ORAL_TABLET | ORAL | Status: AC | PRN

## 2019-07-22 MED ORDER — METOPROLOL SUCCINATE ER 50 MG PO TB24: 50.0000 mg | ORAL_TABLET | Freq: Every day | ORAL | 0 refills | Status: AC

## 2019-07-22 MED ORDER — LEVEMIR FLEXTOUCH 100 UNIT/ML ~~LOC~~ SOPN: 10.0000 [IU] | PEN_INJECTOR | Freq: Every day | SUBCUTANEOUS | 11 refills | Status: AC

## 2019-07-22 MED ORDER — IPRATROPIUM-ALBUTEROL 20-100 MCG/ACT IN AERS: 1.0000 | INHALATION_SPRAY | Freq: Four times a day (QID) | RESPIRATORY_TRACT | 0 refills | Status: DC | PRN

## 2019-07-22 MED ORDER — CALCIUM ACETATE (PHOS BINDER) 667 MG PO CAPS: 1334.0000 mg | ORAL_CAPSULE | Freq: Three times a day (TID) | ORAL | 0 refills | Status: DC

## 2019-07-22 MED ORDER — METOPROLOL SUCCINATE ER 50 MG PO TB24: 50.0000 mg | ORAL_TABLET | Freq: Every day | ORAL | 0 refills | Status: DC

## 2019-07-22 NOTE — Plan of Care (Signed)
  Problem: Health Behavior/Discharge Planning: Goal: Ability to manage health-related needs will improve Outcome: Progressing   

## 2019-07-22 NOTE — Progress Notes (Signed)
Patient alert and oriented, vss, no complaints of pain.  D/c this morning via dr. Earleen Newport.  On room air.  No questions about discharge instructions.  Escorted out of hospital via wheelchair by nursing.

## 2019-07-22 NOTE — Discharge Summary (Signed)
[facility] [place] NAME: Donald Silva    MR#:  [id number]  DATE OF BIRTH:  Apr 28, 1956  DATE OF ADMISSION:  07/18/2019 ADMITTING PHYSICIAN: Tyler Pita, MD  DATE OF DISCHARGE: 07/22/2019  8:55 AM  PRIMARY CARE PHYSICIAN: Letta Median, MD    ADMISSION DIAGNOSIS:  Shortness of breath [R06.02] Hyperkalemia [E87.5] Respiratory distress [R06.03] Hypoxia [R09.02] ESRD (end stage renal disease) on dialysis ([place]) [N18.6, Z99.2]  DISCHARGE DIAGNOSIS:  Active Problems:   Type 2 diabetes mellitus with ESRD (end-stage renal disease) ([place])   ESRD (end stage renal disease) on dialysis ([facility])   Acute and chronic respiratory failure (acute-on-chronic) ([facility])   Pneumonia due to COVID-19 virus   Hypervolemia   RLS (restless legs syndrome)   SECONDARY DIAGNOSIS:   Past Medical History:  Diagnosis Date  . CKD (chronic kidney disease) stage 5, GFR less than 15 ml/min ([facility])   . Diabetes mellitus without complication ([place])   . Dyspnea   . GERD (gastroesophageal reflux disease)   . Hypertension   . Lower extremity edema   . Myocardial infarction [facility])     HOSPITAL COURSE:   1.  COVID-19 pneumonia versus fluid overload.  Patient received 5 days of remdesivir here in the hospital.  Was on Decadron.  I will give a few more days of Decadron upon going home.  Prescribe zinc, vitamin C.  Patient was feeling fine and off oxygen and ready to go home. 2.  Acute hypoxic respiratory failure secondary to COVID-19 pneumonia and pulmonary edema.  Patient initially required BiPAP and was admitted by the critical care specialist.  Was tapered down to just nasal cannula.  Upon discharge patient was breathing comfortably on room air and this has resolved as a problem.  I told the patient he must continue to isolate himself to prevent spread to others.  He will need to wear a mask when at dialysis. 3.  Fluid overload, acute diastolic congestive heart failure.   Patient received dialysis here in the hospital and will go to outpatient dialysis on the day of discharge. 4.  End-stage renal disease with hyperkalemia.  Dialysis to manage potassium. 5.  Essential hypertension on amlodipine and metoprolol.  Discussed low-salt diet with the patient. 6.  GERD on PPI 7.  Type 2 diabetes mellitus with complication of end-stage renal disease.  Patient was on sliding scale insulin here in the hospital.  Last hemoglobin A1c 6.5.  The patient takes long-acting insulin Levemir 50 units at bedtime.  I told him that we have only been doing sliding scale and I do not think he will need this much we can cut them back to 10 units at bedtime and watch sugars closely.  DISCHARGE CONDITIONS:   Satisfactory  CONSULTS OBTAINED:  Nephrology Critical care specialist  DRUG ALLERGIES:   Allergies  Allergen Reactions  . Penicillins Anaphylaxis    Was told it could kill him, pt unsure of reaction  Has patient had a PCN reaction causing immediate rash, facial/tongue/throat swelling, SOB or lightheadedness with hypotension: No Has patient had a PCN reaction causing severe rash involving mucus membranes or skin necrosis: No Has patient had a PCN reaction that required hospitalization: No Has patient had a PCN reaction occurring within the last 10 years: No If all of the above answers are "NO", then may proceed with Cephalosporin use.   . Cyclopentolate Other (See Comments)    DISCHARGE MEDICATIONS:   Allergies as of 07/22/2019  Reactions   Penicillins Anaphylaxis   Was told it could kill him, pt unsure of reaction  Has patient had a PCN reaction causing immediate rash, facial/tongue/throat swelling, SOB or lightheadedness with hypotension: No Has patient had a PCN reaction causing severe rash involving mucus membranes or skin necrosis: No Has patient had a PCN reaction that required hospitalization: No Has patient had a PCN reaction occurring within the last 10  years: No If all of the above answers are "NO", then may proceed with Cephalosporin use.   Cyclopentolate Other (See Comments)      Medication List    STOP taking these medications   lanthanum 1000 MG chewable tablet Commonly known as: FOSRENOL   lisinopril 40 MG tablet Commonly known as: ZESTRIL     TAKE these medications   acetaminophen 325 MG tablet Commonly known as: Mapap Take 1 tablet (325 mg total) by mouth every 4 (four) hours as needed for mild pain.   amLODipine 10 MG tablet Commonly known as: NORVASC Take 10 mg by mouth daily.   ascorbic acid 500 MG tablet Commonly known as: VITAMIN C Take 1 tablet (500 mg total) by mouth daily.   aspirin EC 81 MG tablet Take 81 mg daily by mouth.   calcium acetate 667 MG capsule Commonly known as: PHOSLO Take 2 capsules (1,334 mg total) by mouth 3 (three) times daily with meals.   calcium carbonate 750 MG chewable tablet Commonly known as: TUMS EX Chew 2 tablets by mouth at bedtime.   dexamethasone 2 MG tablet Commonly known as: DECADRON 2 tabs po day1,2; 1 tab po day3,4; 1/2 tab day5,6   famotidine 20 MG tablet Commonly known as: PEPCID Take 1 tablet (20 mg total) by mouth every other day. Start taking on: July 23, 2019   Ipratropium-Albuterol 20-100 MCG/ACT Aers respimat Commonly known as: COMBIVENT Inhale 1 puff into the lungs every 6 (six) hours as needed for wheezing or shortness of breath.   Levemir FlexTouch 100 UNIT/ML Pen Generic drug: Insulin Detemir Inject 10 Units into the skin at bedtime. What changed: how much to take   metoprolol succinate 50 MG 24 hr tablet Commonly known as: TOPROL-XL Take 1 tablet (50 mg total) by mouth daily. Take with or immediately following a meal. What changed:   medication strength  how much to take  additional instructions   zinc sulfate 220 (50 Zn) MG capsule Take 1 capsule (220 mg total) by mouth daily.        DISCHARGE INSTRUCTIONS:  Follow-up PMD  5 days Follow-up dialysis today  If you experience worsening of your admission symptoms, develop shortness of breath, life threatening emergency, suicidal or homicidal thoughts you must seek medical attention immediately by calling 911 or calling your MD immediately  if symptoms less severe.  You Must read complete instructions/literature along with all the possible adverse reactions/side effects for all the Medicines you take and that have been prescribed to you. Take any new Medicines after you have completely understood and accept all the possible adverse reactions/side effects.   Please note  You were cared for by a hospitalist during your hospital stay. If you have any questions about your discharge medications or the care you received while you were in the hospital after you are discharged, you can call the unit and asked to speak with the hospitalist on call if the hospitalist that took care of you is not available. Once you are discharged, your primary care physician will handle any further  medical issues. Please note that NO REFILLS for any discharge medications will be authorized once you are discharged, as it is imperative that you return to your primary care physician (or establish a relationship with a primary care physician if you do not have one) for your aftercare needs so that they can reassess your need for medications and monitor your lab values.    Today   CHIEF COMPLAINT:   Chief Complaint  Patient presents with  . Shortness of Breath    HISTORY OF PRESENT ILLNESS:  Dallan Schonberg  is a 63 y.o. male presented with shortness of breath   VITAL SIGNS:  Blood pressure (!) 153/75, pulse 70, temperature 98.4 F (36.9 C), temperature source Oral, resp. rate 17, height 5\' 11"  (1.803 m), weight 117.4 kg, SpO2 99 %.    PHYSICAL EXAMINATION:  GENERAL:  63 y.o.-year-old patient lying in the bed with no acute distress.  EYES: Pupils equal, round, reactive to light and  accommodation. No scleral icterus. Extraocular muscles intact.  HEENT: Head atraumatic, normocephalic. Oropharynx and nasopharynx clear.  NECK:  Supple, no jugular venous distention. No thyroid enlargement, no tenderness.  LUNGS: Normal breath sounds bilaterally, no wheezing, rales,rhonchi or crepitation. No use of accessory muscles of respiration.  CARDIOVASCULAR: S1, S2 normal. No murmurs, rubs, or gallops.  ABDOMEN: Soft, non-tender, non-distended. Bowel sounds present. No organomegaly or mass.  EXTREMITIES: Trace pedal edema, cyanosis, or clubbing.  NEUROLOGIC: Cranial nerves II through XII are intact. Muscle strength 5/5 in all extremities. Sensation intact. Gait not checked.  PSYCHIATRIC: The patient is alert and oriented x 3.   DATA REVIEW:   CBC Recent Labs  Lab 07/22/19 0540  WBC 5.8  HGB 10.4*  HCT 31.0*  PLT 181    Chemistries  Recent Labs  Lab 07/22/19 0540  NA 138  K 4.7  CL 93*  CO2 26  GLUCOSE 155*  BUN 104*  CREATININE 16.06*  CALCIUM 7.1*  MG 2.3  AST 22  ALT 28  ALKPHOS 76  BILITOT 0.6    Microbiology Results  Results for orders placed or performed during the hospital encounter of 07/18/19  Respiratory Panel by RT PCR (Flu A&B, Covid) - Nasopharyngeal Swab     Status: Abnormal   Collection Time: 07/18/19  1:03 AM   Specimen: Nasopharyngeal Swab  Result Value Ref Range Status   SARS Coronavirus 2 by RT PCR POSITIVE (A) NEGATIVE Final    Comment: RESULT CALLED TO, READ BACK BY AND VERIFIED WITH: Georgann Housekeeper RN [phone] 07/18/2019 [facility] (NOTE) SARS-CoV-2 target nucleic acids are DETECTED. SARS-CoV-2 RNA is generally detectable in upper respiratory specimens  during the acute phase of infection. Positive results are indicative of the presence of the identified virus, but do not rule out bacterial infection or co-infection with other pathogens not detected by the test. Clinical correlation with patient history and other diagnostic information is  necessary to determine patient infection status. The expected result is Negative. Fact Sheet for Patients:  PinkCheek.be Fact Sheet for Healthcare Providers: GravelBags.it This test is not yet approved or cleared by the [place] FDA and  has been authorized for detection and/or diagnosis of SARS-CoV-2 by FDA under an Emergency Use Authorization (EUA).  This EUA will remain in effect (meaning this test can be used) fo r the duration of  the COVID-19 declaration under Section 564(b)(1) of the Act, 21 U.S.C. section 360bbb-3(b)(1), unless the authorization is terminated or revoked sooner.    Influenza A by PCR NEGATIVE  NEGATIVE Final   Influenza B by PCR NEGATIVE NEGATIVE Final    Comment: (NOTE) The Xpert Xpress SARS-CoV-2/FLU/RSV assay is intended as an aid in  the diagnosis of influenza from Nasopharyngeal swab specimens and  should not be used as a sole basis for treatment. Nasal washings and  aspirates are unacceptable for Xpert Xpress SARS-CoV-2/FLU/RSV  testing. Fact Sheet for Patients: PinkCheek.be Fact Sheet for Healthcare Providers: GravelBags.it This test is not yet approved or cleared by the [place] FDA and  has been authorized for detection and/or diagnosis of SARS-CoV-2 by  FDA under an Emergency Use Authorization (EUA). This EUA will remain  in effect (meaning this test can be used) for the duration of the  Covid-19 declaration under Section 564(b)(1) of the Act, 21  U.S.C. section 360bbb-3(b)(1), unless the authorization is  terminated or revoked. Performed at [facility], [address] [place], [place] [address]   Culture, blood (Routine X 2) w Reflex to ID Panel     Status: None (Preliminary result)   Collection Time: 07/18/19  4:33 AM   Specimen: BLOOD  Result Value Ref Range Status   Specimen Description BLOOD DIALYSIS PORT   Final   Special Requests   Final    BOTTLES DRAWN AEROBIC AND ANAEROBIC Blood Culture adequate volume   Culture   Final    NO GROWTH 4 DAYS Performed at [facility], [address], [place], [place] [address]    Report Status PENDING  Incomplete  Culture, blood (Routine X 2) w Reflex to ID Panel     Status: None (Preliminary result)   Collection Time: 07/18/19  4:33 AM   Specimen: BLOOD  Result Value Ref Range Status   Specimen Description BLOOD BLOOD RIGHT HAND  Final   Special Requests   Final    BOTTLES DRAWN AEROBIC AND ANAEROBIC Blood Culture adequate volume   Culture   Final    NO GROWTH 4 DAYS Performed at [facility], [address], [place], [place] [address]    Report Status PENDING  Incomplete    Management plans discussed with the patient, family and they are in agreement.  CODE STATUS:  Code Status History    Date Active Date Inactive Code Status Order ID Comments User Context   07/18/2019 0234 07/22/2019 1209 Full Code [id number]  Awilda Bill, NP ED   06/13/2019 0510 06/15/2019 1841 Full Code [id number]  Mansy, Arvella Merles, MD ED   06/24/2017 2228 06/25/2017 1441 Full Code [id number]  Fritzi Mandes, MD Inpatient   Advance Care Planning Activity      TOTAL TIME TAKING CARE OF THIS PATIENT: 35 minutes.    Loletha Grayer M.D on 07/22/2019 at 3:17 PM  Between 7am to 6pm - Pager - [phone]  After 6pm go to [email or web address] - password [email or web address]  [facility]  CC: Primary care physician; Letta Median, MD

## 2019-07-23 LAB — CULTURE, BLOOD (ROUTINE X 2)
Culture: NO GROWTH
Culture: NO GROWTH
Special Requests: ADEQUATE
Special Requests: ADEQUATE

## 2019-07-26 DIAGNOSIS — U071 COVID-19: Secondary | ICD-10-CM | POA: Insufficient documentation

## 2019-08-12 ENCOUNTER — Other Ambulatory Visit: Payer: Self-pay

## 2019-08-12 ENCOUNTER — Emergency Department
Admission: EM | Admit: 2019-08-12 | Discharge: 2019-08-12 | Disposition: A | Discharge status: Home / Self Care | Payer: Medicare Other | Attending: Emergency Medicine | Admitting: Emergency Medicine

## 2019-08-12 ENCOUNTER — Emergency Department: Payer: Medicare Other

## 2019-08-12 ENCOUNTER — Encounter: Payer: Self-pay | Admitting: Emergency Medicine

## 2019-08-12 DIAGNOSIS — E1122 Type 2 diabetes mellitus with diabetic chronic kidney disease: Secondary | ICD-10-CM | POA: Insufficient documentation

## 2019-08-12 DIAGNOSIS — N186 End stage renal disease: Secondary | ICD-10-CM | POA: Diagnosis not present

## 2019-08-12 DIAGNOSIS — I120 Hypertensive chronic kidney disease with stage 5 chronic kidney disease or end stage renal disease: Secondary | ICD-10-CM | POA: Insufficient documentation

## 2019-08-12 DIAGNOSIS — Z992 Dependence on renal dialysis: Secondary | ICD-10-CM | POA: Insufficient documentation

## 2019-08-12 DIAGNOSIS — Z7982 Long term (current) use of aspirin: Secondary | ICD-10-CM | POA: Insufficient documentation

## 2019-08-12 DIAGNOSIS — Z794 Long term (current) use of insulin: Secondary | ICD-10-CM | POA: Diagnosis not present

## 2019-08-12 DIAGNOSIS — Z79899 Other long term (current) drug therapy: Secondary | ICD-10-CM | POA: Diagnosis not present

## 2019-08-12 DIAGNOSIS — I10 Essential (primary) hypertension: Secondary | ICD-10-CM

## 2019-08-12 HISTORY — DX: COVID-19: U07.1

## 2019-08-12 LAB — BASIC METABOLIC PANEL
Anion gap: 19 — ABNORMAL HIGH (ref 5–15)
BUN: 60 mg/dL — ABNORMAL HIGH (ref 8–23)
CO2: 28 mmol/L (ref 22–32)
Calcium: 9 mg/dL (ref 8.9–10.3)
Chloride: 96 mmol/L — ABNORMAL LOW (ref 98–111)
Creatinine, Ser: 12.13 mg/dL — ABNORMAL HIGH (ref 0.61–1.24)
GFR calc Af Amer: 4 mL/min — ABNORMAL LOW (ref >60)
GFR calc non Af Amer: 4 mL/min — ABNORMAL LOW (ref >60)
Glucose, Bld: 127 mg/dL — ABNORMAL HIGH (ref 70–99)
Potassium: 4.3 mmol/L (ref 3.5–5.1)
Sodium: 143 mmol/L (ref 135–145)

## 2019-08-12 LAB — CBC
HCT: 29.4 % — ABNORMAL LOW (ref 39.0–52.0)
Hemoglobin: 9.7 g/dL — ABNORMAL LOW (ref 13.0–17.0)
MCH: 30.8 pg (ref 26.0–34.0)
MCHC: 33 g/dL (ref 30.0–36.0)
MCV: 93.3 fL (ref 80.0–100.0)
Platelets: 213 10*3/uL (ref 150–400)
RBC: 3.15 MIL/uL — ABNORMAL LOW (ref 4.22–5.81)
RDW: 14.6 % (ref 11.5–15.5)
WBC: 7.8 10*3/uL (ref 4.0–10.5)
nRBC: 0 % (ref 0.0–0.2)

## 2019-08-12 MED ORDER — AMLODIPINE BESYLATE 10 MG PO TABS: 10.0000 mg | ORAL_TABLET | Freq: Every day | ORAL | 0 refills | Status: AC

## 2019-08-12 MED ORDER — AMLODIPINE BESYLATE 5 MG PO TABS
10.0000 mg | ORAL_TABLET | Freq: Once | ORAL | Status: AC
  Administered 2019-08-12: 10 mg via ORAL
  Filled 2019-08-12: qty 2

## 2019-08-12 MED ORDER — LISINOPRIL 40 MG PO TABS: 40.0000 mg | ORAL_TABLET | Freq: Every day | ORAL | 0 refills | Status: AC

## 2019-08-12 MED ORDER — LISINOPRIL 10 MG PO TABS
40.0000 mg | ORAL_TABLET | Freq: Once | ORAL | Status: AC
  Administered 2019-08-12: 40 mg via ORAL
  Filled 2019-08-12: qty 4

## 2019-08-12 NOTE — ED Triage Notes (Addendum)
Pt arrived via POV with reports of elevated blood pressure since he states he was here in early Dec. Pt states he was taken of Lisinopril, states he does not know what he was taken off it.  Pt is on dialysis, pt states he is due for treatment tomorrow.  Pt reports he has a cold as well was COVID + 12/8  Pt states BP at home was 347 systolic.

## 2019-08-12 NOTE — ED Provider Notes (Addendum)
[facility] Emergency Department Provider Note  ____________________________________________  Time seen: Approximately 10:33 PM  I have reviewed the triage vital signs and the nursing notes.   HISTORY  Chief Complaint Hypertension and Shortness of Breath (COVID POSITIVE 12/8)    HPI Donald Silva is a 64 y.o. male with a history of end-stage renal disease on hemodialysis, recent COVID-19 infection, diabetes, hypertension who comes the ED complaining of elevated blood pressure.  He had previously been on metoprolol, amlodipine, lisinopril until he was hospitalized on July 18, 2019 for COVID-19 pneumonia with hypoxic respiratory failure.  He also had hypertensive urgency at that time necessitating the use of IV nicardipine, so his oral blood pressure medicines were held.  He reports that on discharge from the hospital he was not restarted on his lisinopril and he does not understand why.  Reviewing the electronic medical record, I see no discussion of why this was the case.  Since then he is only been taking metoprolol 50 mg once a day for blood pressure control, not amlodipine, not lisinopril.  He reports persistent cough since his hospitalization with Covid, but no acute shortness of breath or chest pain currently.  No palpitations or generalized weakness.  No syncope.  He is here to request blood pressure medicine.  His last dialysis was 3 days ago due to holiday scheduling with the new year.  He is scheduled for a dialysis session at 5 AM tomorrow morning.      Past Medical History:  Diagnosis Date  . CKD (chronic kidney disease) stage 5, GFR less than 15 ml/min (HCC)   . COVID-19   . Diabetes mellitus without complication ([place])   . Dyspnea   . GERD (gastroesophageal reflux disease)   . Hypertension   . Lower extremity edema   . Myocardial infarction [facility])      Patient Active Problem List   Diagnosis Date Noted  . Hypervolemia   . RLS (restless  legs syndrome)   . Acute and chronic respiratory failure (acute-on-chronic) ([place]) 07/18/2019  . Pneumonia due to COVID-19 virus 07/18/2019  . Acute respiratory failure ([place]) 06/13/2019  . Flash pulmonary edema ([place])   . ESRD (end stage renal disease) on dialysis ([place])   . Acute CHF (congestive heart failure) ([place]) 05/22/2019  . Postop check 03/02/2019  . 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 Complication of vascular access for dialysis 04/20/2018  . Chronic kidney disease, stage V ([place]) 08/11/2017  . Essential hypertension 08/11/2017  . Type 2 diabetes mellitus with ESRD (end-stage renal disease) ([place]) 08/11/2017  . Hyperkalemia 06/24/2017  . Multiple-type hyperlipidemia 03/25/2012  . Vitamin D deficiency 03/25/2012  . ED (erectile dysfunction) of organic origin 09/14/2011     Past Surgical History:  Procedure Laterality Date  . A/V FISTULAGRAM N/A 05/03/2018   Procedure: A/V FISTULAGRAM;  Surgeon: Katha Cabal, MD;  Location: [facility];  Service: Cardiovascular;  Laterality: N/A;  . AV FISTULA PLACEMENT Left 08/27/2017   Procedure: ARTERIOVENOUS (AV) FISTULA CREATION ( BRACHIAL CEPHALIC );  Surgeon: Katha Cabal, MD;  Location: [facility] ORS;  Service: Vascular;  Laterality: Left;  . CHOLECYSTECTOMY    . TOE SURGERY Right      Prior to Admission medications   Medication Sig Start Date End Date Taking? Authorizing Provider  acetaminophen (MAPAP) 325 MG tablet Take 1 tablet (325 mg total) by mouth every 4 (four) hours as needed for mild pain. 07/22/19 07/20/20  Loletha Grayer,  MD  amLODipine (NORVASC) 10 MG tablet Take 1 tablet (10 mg total) by mouth daily. 08/12/19   Carrie Mew, MD  aspirin EC 81 MG tablet Take 81 mg daily by mouth.    [provider]  calcium acetate (PHOSLO) 667 MG capsule Take 2 capsules (1,334 mg total) by mouth 3 (three) times daily with meals. 07/22/19   Loletha Grayer, MD  calcium carbonate (TUMS EX) 750 MG  chewable tablet Chew 2 tablets by mouth at bedtime.    [provider]  dexamethasone (DECADRON) 2 MG tablet 2 tabs po day1,2; 1 tab po day3,4; 1/2 tab day5,6 07/22/19   Loletha Grayer, MD  famotidine (PEPCID) 20 MG tablet Take 1 tablet (20 mg total) by mouth every other day. 07/23/19   Loletha Grayer, MD  Insulin Detemir (LEVEMIR FLEXTOUCH) 100 UNIT/ML Pen Inject 10 Units into the skin at bedtime. 07/22/19   Loletha Grayer, MD  Ipratropium-Albuterol (COMBIVENT) 20-100 MCG/ACT AERS respimat Inhale 1 puff into the lungs every 6 (six) hours as needed for wheezing or shortness of breath. 07/22/19   Loletha Grayer, MD  lisinopril (ZESTRIL) 40 MG tablet Take 1 tablet (40 mg total) by mouth daily. 08/12/19   Carrie Mew, MD  metoprolol succinate (TOPROL-XL) 50 MG 24 hr tablet Take 1 tablet (50 mg total) by mouth daily. Take with or immediately following a meal. 07/22/19   Loletha Grayer, MD  vitamin C (VITAMIN C) 500 MG tablet Take 1 tablet (500 mg total) by mouth daily. 07/22/19   Loletha Grayer, MD  zinc sulfate 220 (50 Zn) MG capsule Take 1 capsule (220 mg total) by mouth daily. 07/22/19   Loletha Grayer, MD     Allergies Penicillins and Cyclopentolate   Family History  Problem Relation Age of Onset  . Hypertension Sister   . Diabetes Sister   . Diabetes Father     Social History Social History   Tobacco Use  . Smoking status: Never Smoker  . Smokeless tobacco: Never Used  Substance Use Topics  . Alcohol use: No  . Drug use: No    Review of Systems  Constitutional:   No fever or chills.  ENT:   No sore throat. No rhinorrhea. Cardiovascular:   No chest pain or syncope. Respiratory:   No dyspnea, chronic cough. Gastrointestinal:   Negative for abdominal pain, vomiting and diarrhea.  Musculoskeletal:   Negative for focal pain or swelling All other systems reviewed and are negative except as documented above in ROS and  HPI.  ____________________________________________   PHYSICAL EXAM:  VITAL SIGNS: ED Triage Vitals  Enc Vitals Group     BP 08/12/19 1503 (!) 199/127     Pulse Rate 08/12/19 1503 90     Resp 08/12/19 1503 18     Temp 08/12/19 1503 98.5 F (36.9 C)     Temp Source 08/12/19 1503 Oral     SpO2 08/12/19 1503 90 %     Weight 08/12/19 1500 255 lb (115.7 kg)     Height 08/12/19 1500 5\' 11"  (1.803 m)     Head Circumference --      Peak Flow --      Pain Score 08/12/19 1506 0     Pain Loc --      Pain Edu? --      Excl. in [place]? --     Vital signs reviewed, nursing assessments reviewed.   Constitutional:   Alert and oriented. Non-toxic appearance. Eyes:   Conjunctivae are normal. EOMI.  PERRL. ENT      Head:   Normocephalic and atraumatic.      Nose:   Wearing a mask.      Mouth/Throat:   Wearing a mask.      Neck:   No meningismus. Full ROM. Hematological/Lymphatic/Immunilogical:   No cervical lymphadenopathy. Cardiovascular:   RRR. Symmetric bilateral radial and DP pulses.  No murmurs. Cap refill less than 2 seconds. Respiratory:   Normal respiratory effort without tachypnea/retractions. Breath sounds are clear and equal bilaterally. No wheezes/rales/rhonchi. Gastrointestinal:   Soft and nontender. Non distended. There is no CVA tenderness.  No rebound, rigidity, or guarding.  Musculoskeletal:   Normal range of motion in all extremities. No joint effusions.  No lower extremity tenderness.  No edema. Neurologic:   Normal speech and language.  Motor grossly intact. No acute focal neurologic deficits are appreciated.  Skin:    Skin is warm, dry and intact. No rash noted.  No petechiae, purpura, or bullae.  ____________________________________________    LABS (pertinent positives/negatives) (all labs ordered are listed, but only abnormal results are displayed) Labs Reviewed  BASIC METABOLIC PANEL - Abnormal; Notable for the following components:      Result Value   Chloride  96 (*)    Glucose, Bld 127 (*)    BUN 60 (*)    Creatinine, Ser 12.13 (*)    GFR calc non Af Amer 4 (*)    GFR calc Af Amer 4 (*)    Anion gap 19 (*)    All other components within normal limits  CBC - Abnormal; Notable for the following components:   RBC 3.15 (*)    Hemoglobin 9.7 (*)    HCT 29.4 (*)    All other components within normal limits   ____________________________________________   EKG Interpreted by me Normal sinus rhythm rate of 87, left axis, left ventricular hypertrophy, right bundle branch block.  No acute ischemic changes.  ____________________________________________    RADIOLOGY  DG Chest 2 View  Result Date: 08/12/2019 CLINICAL DATA:  Increased BP today (225/115) Recent discharge from hospital. Per chart: Type 2 diabetes mellitus with ESRD (end-stage renal disease) ([place]) ESRD (end stage renal disease) on dialysis ([place]) EXAM: CHEST - 2 VIEW COMPARISON:  Chest radiograph 07/18/2019 FINDINGS: Stable cardiomediastinal contours mildly enlarged heart size. There are mild diffuse bilateral interstitial opacities which appear improved compared to prior chest radiograph. No new focal consolidation. No pneumothorax. Trace right pleural fluid. IMPRESSION: Mild diffuse bilateral interstitial opacities which appear improved compared to prior chest x-ray. Findings most likely represent trace edema. Electronically Signed   By: Audie Pinto M.D.   On: 08/12/2019 16:27    ____________________________________________   PROCEDURES Procedures  ____________________________________________    CLINICAL IMPRESSION / ASSESSMENT AND PLAN / ED COURSE  Medications ordered in the ED: Medications  lisinopril (ZESTRIL) tablet 40 mg (has no administration in time range)  amLODipine (NORVASC) tablet 10 mg (has no administration in time range)    Pertinent labs & imaging results that were available during my care of the patient were reviewed by me and considered in my medical  decision making (see chart for details).  Donald Silva was evaluated in Emergency Department on 08/12/2019 for the symptoms described in the history of present illness. He was evaluated in the context of the global COVID-19 pandemic, which necessitated consideration that the patient might be at risk for infection with the SARS-CoV-2 virus that causes COVID-19. Institutional protocols and algorithms that pertain to the  evaluation of patients at risk for COVID-19 are in a state of rapid change based on information released by regulatory bodies including the CDC and federal and state organizations. These policies and algorithms were followed during the patient's care in the ED.   Patient presents requesting blood pressure medicine.  No acute symptoms.  He has a persistent chronic cough.  Exam is reassuring, he is nontoxic.  Do not see any reason why he should not be on his previous blood pressure regimen at this point, so I will give him a dose of amlodipine and lisinopril and discharged home with prescriptions for same.  Will go to his dialysis appointment in the morning.  Recommend close follow-up with primary care for blood pressure monitoring and medication adjustment as needed.  No indication for urgent dialysis.  Vital signs unremarkable except for severe hypertension.  Electrolytes are okay.  No volume overload/pulmonary edema.  He does have some infiltrates on chest x-ray which are improved from previous and I think chronic at this point due to his recent Covid illness.      ____________________________________________   FINAL CLINICAL IMPRESSION(S) / ED DIAGNOSES    Final diagnoses:  Hypertension, unspecified type  End-stage renal disease on hemodialysis [facility])     ED Discharge Orders         Ordered    amLODipine (NORVASC) 10 MG tablet  Daily     08/12/19 2232    lisinopril (ZESTRIL) 40 MG tablet  Daily     08/12/19 2232          Portions of this note were generated with  dragon dictation software. Dictation errors may occur despite best attempts at proofreading.   Carrie Mew, MD 08/12/19 4098    Carrie Mew, MD 08/12/19 2238

## 2019-08-22 NOTE — Progress Notes (Signed)
This encounter was created in error - please disregard.

## 2019-08-25 ENCOUNTER — Ambulatory Visit (INDEPENDENT_AMBULATORY_CARE_PROVIDER_SITE_OTHER): Payer: Medicare Other | Admitting: Podiatry

## 2019-08-25 ENCOUNTER — Other Ambulatory Visit: Payer: Self-pay

## 2019-08-25 DIAGNOSIS — B351 Tinea unguium: Secondary | ICD-10-CM

## 2019-08-25 DIAGNOSIS — M79675 Pain in left toe(s): Secondary | ICD-10-CM

## 2019-08-25 DIAGNOSIS — E0843 Diabetes mellitus due to underlying condition with diabetic autonomic (poly)neuropathy: Secondary | ICD-10-CM

## 2019-08-25 DIAGNOSIS — L989 Disorder of the skin and subcutaneous tissue, unspecified: Secondary | ICD-10-CM | POA: Diagnosis not present

## 2019-08-25 DIAGNOSIS — M79674 Pain in right toe(s): Secondary | ICD-10-CM

## 2019-08-27 NOTE — Progress Notes (Signed)
    Subjective: Patient is a 64 y.o. male presenting to the office today with a chief complaint of painful callus lesion(s) noted to the bilateral feet that have been present for the past few months. Walking and bearing weight increases the pain. He has not done anything at home for treatment.  Patient also complains of elongated, thickened nails that cause pain while ambulating in shoes. He is unable to trim his own nails. Patient presents today for further treatment and evaluation.  Past Medical History:  Diagnosis Date  . CKD (chronic kidney disease) stage 5, GFR less than 15 ml/min (HCC)   . COVID-19   . Diabetes mellitus without complication ([place])   . Dyspnea   . GERD (gastroesophageal reflux disease)   . Hypertension   . Lower extremity edema   . Myocardial infarction [facility])     Objective:  Physical Exam General: Alert and oriented x3 in no acute distress  Dermatology: Hyperkeratotic lesion(s) present on the bilateral feet. Pain on palpation with a central nucleated core noted. Skin is warm, dry and supple bilateral lower extremities. Negative for open lesions or macerations. Nails are tender, long, thickened and dystrophic with subungual debris, consistent with onychomycosis, 1-5 left, 2-4 right. No signs of infection noted.  Vascular: Palpable pedal pulses bilaterally. No edema or erythema noted. Capillary refill within normal limits.  Neurological: Epicritic and protective threshold diminished bilaterally.   Musculoskeletal Exam: Pain on palpation at the keratotic lesion(s) noted. Range of motion within normal limits bilateral. Muscle strength 5/5 in all groups bilateral.  Assessment: 1. Onychodystrophic nails 1-5 bilateral with hyperkeratosis of nails.  2. Onychomycosis of nail due to dermatophyte bilateral 3. Pre-ulcerative callus lesions noted to the bilateral feet x 4 4. 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 Patient evaluated. 2. Excisional  debridement of keratoic lesion(s) using a chisel blade was performed without incident.  3. Dressed with light dressing. 4. Mechanical debridement of nails 1-5 bilaterally performed using a nail nipper. Filed with dremel without incident.  5. Appointment with Liliane Channel, Pedorthist, for DM shoes.  6. Patient is to return to the clinic in 3 months.   Edrick Kins, DPM [facility]  Dr. Edrick Kins, [address]                                        [place], [place] [address]                Office [phone]  Fax [phone]

## 2019-09-13 ENCOUNTER — Ambulatory Visit: Payer: Medicare Other | Admitting: Orthotics

## 2019-09-13 ENCOUNTER — Other Ambulatory Visit: Payer: Self-pay

## 2019-09-13 DIAGNOSIS — L989 Disorder of the skin and subcutaneous tissue, unspecified: Secondary | ICD-10-CM

## 2019-09-13 DIAGNOSIS — E0843 Diabetes mellitus due to underlying condition with diabetic autonomic (poly)neuropathy: Secondary | ICD-10-CM

## 2019-09-13 NOTE — Progress Notes (Signed)
Patient was measured for med necessity extra depth shoes (x2) w/ 3 left and 1 RT hallux and 5 (1)  custom toe filler. f/o. Patient was measured w/ brannock to determine size and width.  Foam impression mold was achieved and deemed appropriate for fabrication of  cmfo.   See DPM chart notes for further documentation and dx codes for determination of medical necessity.  Appropriate forms will be sent to PCP to verify and sign off on medical necessity.

## 2019-09-22 ENCOUNTER — Other Ambulatory Visit: Payer: Self-pay

## 2019-09-22 ENCOUNTER — Ambulatory Visit (INDEPENDENT_AMBULATORY_CARE_PROVIDER_SITE_OTHER): Payer: Medicare Other | Admitting: Nurse Practitioner

## 2019-09-22 ENCOUNTER — Encounter (INDEPENDENT_AMBULATORY_CARE_PROVIDER_SITE_OTHER): Payer: Self-pay | Admitting: Nurse Practitioner

## 2019-09-22 VITALS — BP 207/97 | HR 85 | Resp 14 | Ht 71.0 in | Wt 247.0 lb

## 2019-09-22 DIAGNOSIS — N186 End stage renal disease: Secondary | ICD-10-CM | POA: Diagnosis not present

## 2019-09-22 DIAGNOSIS — I10 Essential (primary) hypertension: Secondary | ICD-10-CM

## 2019-09-22 DIAGNOSIS — Z992 Dependence on renal dialysis: Secondary | ICD-10-CM | POA: Diagnosis not present

## 2019-09-22 DIAGNOSIS — M79605 Pain in left leg: Secondary | ICD-10-CM

## 2019-09-22 NOTE — Progress Notes (Signed)
SUBJECTIVE:  Patient ID: Donald Silva, male    DOB: [birth year], 64 y.o.   MRN: [id number] Chief Complaint  Patient presents with  . Follow-up    [place]. PAD    HPI  Donald Silva is a 64 y.o. male the presents today as a referral from Dr. Darden Dates regarding leg pain.  Patient states that this pain has been going on for some time, likely months.  The patient has been to the emergency room on multiple occasions due to this pain.  He states that he was told that the hospital was related to nerve pain.  The patient states that his foot gets achy and throbbing and his foot gets cold.  This only happens at night when he lays down.  He denies any claudication-like symptoms, weakness or difficulties with walking.  He states that his pain is nightly.  The patient also does note that he has previous amputation of toes on the right lower extremity however this was due to diabetic wound infection.  He denies any fever, chills, nausea, vomiting or diarrhea.  He denies any chest pain or shortness of breath.  Past Medical History:  Diagnosis Date  . CKD (chronic kidney disease) stage 5, GFR less than 15 ml/min (HCC)   . COVID-19   . Diabetes mellitus without complication ([place])   . Dyspnea   . GERD (gastroesophageal reflux disease)   . Hypertension   . Lower extremity edema   . Myocardial infarction [facility])     Past Surgical History:  Procedure Laterality Date  . A/V FISTULAGRAM N/A 05/03/2018   Procedure: A/V FISTULAGRAM;  Surgeon: Katha Cabal, MD;  Location: [place] [facility];  Service: Cardiovascular;  Laterality: N/A;  . AV FISTULA PLACEMENT Left 08/27/2017   Procedure: ARTERIOVENOUS (AV) FISTULA CREATION ( BRACHIAL CEPHALIC );  Surgeon: Katha Cabal, MD;  Location: [facility];  Service: Vascular;  Laterality: Left;  . CHOLECYSTECTOMY    . TOE SURGERY Right     Social History   Socioeconomic History  . Marital status: Single    Spouse name: Not on file  . Number of children: Not  on file  . Years of education: Not on file  . Highest education level: Not on file  Occupational History  . Not on file  Tobacco Use  . Smoking status: Never Smoker  . Smokeless tobacco: Never Used  Substance and Sexual Activity  . Alcohol use: No  . Drug use: No  . Sexual activity: Not on file  Other Topics Concern  . Not on file  Social History Narrative  . Not on file   Social Determinants of Health   Financial Resource Strain:   . Difficulty of Paying Living Expenses: Not on file  Food Insecurity:   . Worried About Charity fundraiser in the Last Year: Not on file  . Ran Out of Food in the Last Year: Not on file  Transportation Needs:   . Lack of Transportation (Medical): Not on file  . Lack of Transportation (Non-Medical): Not on file  Physical Activity:   . Days of Exercise per Week: Not on file  . Minutes of Exercise per Session: Not on file  Stress:   . Feeling of Stress : Not on file  Social Connections:   . Frequency of Communication with Friends and Family: Not on file  . Frequency of Social Gatherings with Friends and Family: Not on file  . Attends Religious Services: Not on file  .  Active Member of Clubs or Organizations: Not on file  . Attends Archivist Meetings: Not on file  . Marital Status: Not on file  Intimate Partner Violence:   . Fear of Current or Ex-Partner: Not on file  . Emotionally Abused: Not on file  . Physically Abused: Not on file  . Sexually Abused: Not on file    Family History  Problem Relation Age of Onset  . Hypertension Sister   . Diabetes Sister   . Diabetes Father     Allergies  Allergen Reactions  . Penicillins Anaphylaxis    Was told it could kill him, pt unsure of reaction  Has patient had a PCN reaction causing immediate rash, facial/tongue/throat swelling, SOB or lightheadedness with hypotension: No Has patient had a PCN reaction causing severe rash involving mucus membranes or skin necrosis: No Has  patient had a PCN reaction that required hospitalization: No Has patient had a PCN reaction occurring within the last 10 years: No If all of the above answers are "NO", then may proceed with Cephalosporin use.   . Cyclopentolate Other (See Comments)     Review of Systems   Review of Systems: Negative Unless Checked Constitutional: [] Weight loss  [] Fever  [] Chills Cardiac: [] Chest pain   []  Atrial Fibrillation  [] Palpitations   [] Shortness of breath when laying flat   [] Shortness of breath with exertion. [] Shortness of breath at rest Vascular:  [] Pain in legs with walking   [] Pain in legs with standing [x] Pain in legs when laying flat   [] Claudication    [] Pain in feet when laying flat    [] History of DVT   [] Phlebitis   [] Swelling in legs   [] Varicose veins   [] Non-healing ulcers Pulmonary:   [] Uses home oxygen   [] Productive cough   [] Hemoptysis   [] Wheeze  [] COPD   [] Asthma Neurologic:  [] Dizziness   [] Seizures  [] Blackouts [] History of stroke   [] History of TIA  [] Aphasia   [] Temporary Blindness   [] Weakness or numbness in arm   [] Weakness or numbness in leg Musculoskeletal:   [] Joint swelling   [] Joint pain   [] Low back pain  []  History of Knee Replacement [] Arthritis [] back Surgeries  []  Spinal Stenosis    Hematologic:  [] Easy bruising  [] Easy bleeding   [] Hypercoagulable state   [x] Anemic Gastrointestinal:  [] Diarrhea   [] Vomiting  [] Gastroesophageal reflux/heartburn   [] Difficulty swallowing. [] Abdominal pain Genitourinary:  [x] Chronic kidney disease   [] Difficult urination  [] Anuric   [] Blood in urine [] Frequent urination  [] Burning with urination   [] Hematuria Skin:  [] Rashes   [] Ulcers [] Wounds Psychological:  [] History of anxiety   []  History of major depression  []  Memory Difficulties      OBJECTIVE:   Physical Exam  BP (!) 207/97 (BP Location: Right Arm)   Pulse 85   Resp 14   Ht 5\' 11"  (1.803 m)   Wt 247 lb (112 kg)   BMI 34.45 kg/m   Gen: WD/WN, NAD Head: [place]/AT, No  temporalis wasting.  Ear/Nose/Throat: Hearing grossly intact, nares w/o erythema or drainage Eyes: PER, EOMI, sclera nonicteric.  Neck: Supple, no masses.  No JVD.  Pulmonary:  Good air movement, no use of accessory muscles.  Cardiac: RRR Vascular:  Vessel Right Left  Dorsalis Pedis Palpable Palpable  Posterior Tibial Palpable Palpable   Gastrointestinal: soft, non-distended. No guarding/no peritoneal signs.  Musculoskeletal: M/S 5/5 throughout.  No deformity or atrophy.  Neurologic: Pain and light touch intact in extremities.  Symmetrical.  Speech is fluent. Motor exam as listed above. Psychiatric: Judgment intact, Mood & affect appropriate for pt's clinical situation. Dermatologic: No Venous rashes. No Ulcers Noted.  No changes consistent with cellulitis. Lymph : No Cervical lymphadenopathy, no lichenification or skin changes of chronic lymphedema.       ASSESSMENT AND PLAN:  1. ESRD (end stage renal disease) on dialysis [facility]) Patient complains of no issues today.  2. Pain of left lower extremity Today the patient's pain does not seem like it is consistent with PAD however he has numerous risk factors for peripheral arterial disease.  We will perform noninvasive testings at the patient's earliest convenience.  Some of his symptoms also sound a bit like neuropathy.  If his noninvasive testing shows no evidence of peripheral arterial disease we may discuss referral to a neurologist.  3. Essential hypertension Patient blood pressure extremely elevated today.  Patient states that he has been taking medications as prescribed.  Manual recheck today showed 175/82.  Still elevated.  Patient advised that if he becomes to feel dizzy or lightheaded or have chest pain he should go to the emergency room.  Otherwise patient should follow-up with PCP for possible titration of medication.   Current Outpatient Medications on File Prior to Visit  Medication Sig Dispense Refill  . acetaminophen  (MAPAP) 325 MG tablet Take 1 tablet (325 mg total) by mouth every 4 (four) hours as needed for mild pain.    [name] amLODipine (NORVASC) 10 MG tablet Take 1 tablet (10 mg total) by mouth daily. 30 tablet 0  . calcium carbonate (TUMS EX) 750 MG chewable tablet Chew 2 tablets by mouth at bedtime.    . Insulin Detemir (LEVEMIR FLEXTOUCH) 100 UNIT/ML Pen Inject 10 Units into the skin at bedtime. 15 mL 11  . metoprolol succinate (TOPROL-XL) 50 MG 24 hr tablet Take 1 tablet (50 mg total) by mouth daily. Take with or immediately following a meal. 30 tablet 0  . rOPINIRole (REQUIP) 2 MG tablet Take 2 mg by mouth at bedtime.    [name] aspirin EC 81 MG tablet Take 81 mg daily by mouth.    . calcium acetate (PHOSLO) 667 MG capsule Take 2 capsules (1,334 mg total) by mouth 3 (three) times daily with meals. (Patient not taking: Reported on 09/22/2019) 180 capsule 0  . dexamethasone (DECADRON) 2 MG tablet 2 tabs po day1,2; 1 tab po day3,4; 1/2 tab day5,6 (Patient not taking: Reported on 09/22/2019) 7 tablet 0  . famotidine (PEPCID) 20 MG tablet Take 1 tablet (20 mg total) by mouth every other day. (Patient not taking: Reported on 09/22/2019) 15 tablet 0  . Ipratropium-Albuterol (COMBIVENT) 20-100 MCG/ACT AERS respimat Inhale 1 puff into the lungs every 6 (six) hours as needed for wheezing or shortness of breath. (Patient not taking: Reported on 09/22/2019) 4 g 0  . lanthanum (FOSRENOL) 1000 MG chewable tablet Chew 3,000 mg by mouth 3 (three) times daily.    [name] lisinopril (ZESTRIL) 40 MG tablet Take 1 tablet (40 mg total) by mouth daily. (Patient not taking: Reported on 09/22/2019) 30 tablet 0  . vitamin C (VITAMIN C) 500 MG tablet Take 1 tablet (500 mg total) by mouth daily. (Patient not taking: Reported on 09/22/2019) 30 tablet 0  . zinc sulfate 220 (50 Zn) MG capsule Take 1 capsule (220 mg total) by mouth daily. (Patient not taking: Reported on 09/22/2019) 30 capsule 0   No current facility-administered medications on file prior  to visit.    There  are no Patient Instructions on file for this visit. No follow-ups on file.   Kris Hartmann, NP  This note was completed with Sales executive.  Any errors are purely unintentional.

## 2019-10-11 ENCOUNTER — Ambulatory Visit (INDEPENDENT_AMBULATORY_CARE_PROVIDER_SITE_OTHER): Payer: Medicare Other | Admitting: Nurse Practitioner

## 2019-10-11 ENCOUNTER — Ambulatory Visit (INDEPENDENT_AMBULATORY_CARE_PROVIDER_SITE_OTHER): Payer: Medicare Other

## 2019-10-11 ENCOUNTER — Encounter (INDEPENDENT_AMBULATORY_CARE_PROVIDER_SITE_OTHER): Payer: Self-pay | Admitting: Nurse Practitioner

## 2019-10-11 ENCOUNTER — Other Ambulatory Visit: Payer: Self-pay

## 2019-10-11 VITALS — BP 183/92 | HR 86 | Resp 10 | Ht 71.0 in | Wt 246.0 lb

## 2019-10-11 DIAGNOSIS — M79605 Pain in left leg: Secondary | ICD-10-CM

## 2019-10-11 DIAGNOSIS — I10 Essential (primary) hypertension: Secondary | ICD-10-CM

## 2019-10-12 ENCOUNTER — Encounter (INDEPENDENT_AMBULATORY_CARE_PROVIDER_SITE_OTHER): Payer: Self-pay | Admitting: Nurse Practitioner

## 2019-10-12 NOTE — Progress Notes (Signed)
SUBJECTIVE:  Patient ID: Donald Silva, male    DOB: 03-07-1956, 64 y.o.   MRN: [id number] Chief Complaint  Patient presents with  . Follow-up    U/S Follow Up    HPI  Donald Silva is a 64 y.o. male The patient is seen for evaluation of painful lower extremities. Patient notes the pain is variable and not always associated with activity.  The pain is somewhat consistent day to day occurring on most days.  The pain happens mainly at night and it is more so a feeling of numbness and tingling and feeling as if he must move his leg.  He was recently started on Requip and states that since starting that it has helped greatly.    The patient denies rest pain or dangling of an extremity off the side of the bed during the night for relief. No open wounds or sores at this time. No history of DVT or phlebitis. No prior interventions or surgeries.  There is a  history of back problems and DJD of the lumbar and sacral spine.   Today the patient underwent noninvasive studies which revealed noncompressible ABIs bilaterally.  The patient has triphasic tibial artery waveforms bilaterally with strong toe waveforms bilaterally.  Arterial duplex of the left lower extremity has triphasic waveforms throughout the lower extremity. Past Medical History:  Diagnosis Date  . CKD (chronic kidney disease) stage 5, GFR less than 15 ml/min (HCC)   . COVID-19   . Diabetes mellitus without complication ([place])   . Dyspnea   . GERD (gastroesophageal reflux disease)   . Hypertension   . Lower extremity edema   . Myocardial infarction [facility])     Past Surgical History:  Procedure Laterality Date  . A/V FISTULAGRAM N/A 05/03/2018   Procedure: A/V FISTULAGRAM;  Surgeon: Katha Cabal, MD;  Location: [place] [facility];  Service: Cardiovascular;  Laterality: N/A;  . AV FISTULA PLACEMENT Left 08/27/2017   Procedure: ARTERIOVENOUS (AV) FISTULA CREATION ( BRACHIAL CEPHALIC );  Surgeon: Katha Cabal, MD;   Location: [facility];  Service: Vascular;  Laterality: Left;  . CHOLECYSTECTOMY    . TOE SURGERY Right     Social History   Socioeconomic History  . Marital status: Single    Spouse name: Not on file  . Number of children: Not on file  . Years of education: Not on file  . Highest education level: Not on file  Occupational History  . Not on file  Tobacco Use  . Smoking status: Never Smoker  . Smokeless tobacco: Never Used  Substance and Sexual Activity  . Alcohol use: No  . Drug use: No  . Sexual activity: Not on file  Other Topics Concern  . Not on file  Social History Narrative  . Not on file   Social Determinants of Health   Financial Resource Strain:   . Difficulty of Paying Living Expenses: Not on file  Food Insecurity:   . Worried About Charity fundraiser in the Last Year: Not on file  . Ran Out of Food in the Last Year: Not on file  Transportation Needs:   . Lack of Transportation (Medical): Not on file  . Lack of Transportation (Non-Medical): Not on file  Physical Activity:   . Days of Exercise per Week: Not on file  . Minutes of Exercise per Session: Not on file  Stress:   . Feeling of Stress : Not on file  Social Connections:   .  Frequency of Communication with Friends and Family: Not on file  . Frequency of Social Gatherings with Friends and Family: Not on file  . Attends Religious Services: Not on file  . Active Member of Clubs or Organizations: Not on file  . Attends Archivist Meetings: Not on file  . Marital Status: Not on file  Intimate Partner Violence:   . Fear of Current or Ex-Partner: Not on file  . Emotionally Abused: Not on file  . Physically Abused: Not on file  . Sexually Abused: Not on file    Family History  Problem Relation Age of Onset  . Hypertension Sister   . Diabetes Sister   . Diabetes Father     Allergies  Allergen Reactions  . Penicillins Anaphylaxis    Was told it could kill him, pt unsure of reaction  Has  patient had a PCN reaction causing immediate rash, facial/tongue/throat swelling, SOB or lightheadedness with hypotension: No Has patient had a PCN reaction causing severe rash involving mucus membranes or skin necrosis: No Has patient had a PCN reaction that required hospitalization: No Has patient had a PCN reaction occurring within the last 10 years: No If all of the above answers are "NO", then may proceed with Cephalosporin use.   . Cyclopentolate Other (See Comments)     Review of Systems   Review of Systems: Negative Unless Checked Constitutional: [] Weight loss  [] Fever  [] Chills Cardiac: [] Chest pain   []  Atrial Fibrillation  [] Palpitations   [] Shortness of breath when laying flat   [] Shortness of breath with exertion. [] Shortness of breath at rest Vascular:  [] Pain in legs with walking   [] Pain in legs with standing [] Pain in legs when laying flat   [] Claudication    [] Pain in feet when laying flat    [] History of DVT   [] Phlebitis   [] Swelling in legs   [] Varicose veins   [] Non-healing ulcers Pulmonary:   [] Uses home oxygen   [] Productive cough   [] Hemoptysis   [] Wheeze  [] COPD   [] Asthma Neurologic:  [] Dizziness   [] Seizures  [] Blackouts [] History of stroke   [] History of TIA  [] Aphasia   [] Temporary Blindness   [] Weakness or numbness in arm   [x] Weakness or numbness in leg Musculoskeletal:   [] Joint swelling   [] Joint pain   [] Low back pain  []  History of Knee Replacement [] Arthritis [] back Surgeries  []  Spinal Stenosis    Hematologic:  [] Easy bruising  [] Easy bleeding   [] Hypercoagulable state   [x] Anemic Gastrointestinal:  [] Diarrhea   [] Vomiting  [] Gastroesophageal reflux/heartburn   [] Difficulty swallowing. [] Abdominal pain Genitourinary:  [x] Chronic kidney disease   [] Difficult urination  [] Anuric   [] Blood in urine [] Frequent urination  [] Burning with urination   [] Hematuria Skin:  [] Rashes   [] Ulcers [] Wounds Psychological:  [] History of anxiety   []  History of major  depression  []  Memory Difficulties      OBJECTIVE:   Physical Exam  BP (!) 183/92   Pulse 86   Resp 10   Ht 5\' 11"  (1.803 m)   Wt 246 lb (111.6 kg)   BMI 34.31 kg/m   Gen: WD/WN, NAD Head: [place]/AT, No temporalis wasting.  Ear/Nose/Throat: Hearing grossly intact, nares w/o erythema or drainage Eyes: PER, EOMI, sclera nonicteric.  Neck: Supple, no masses.  No JVD.  Pulmonary:  Good air movement, no use of accessory muscles.  Cardiac: RRR Vascular:  Vessel Right Left  Radial Palpable Palpable  Dorsalis Pedis Not Palpable Not Palpable  Posterior Tibial Not Palpable Not Palpable   Gastrointestinal: soft, non-distended. No guarding/no peritoneal signs.  Musculoskeletal: M/S 5/5 throughout.  No deformity or atrophy.  Neurologic: Pain and light touch intact in extremities.  Symmetrical.  Speech is fluent. Motor exam as listed above. Psychiatric: Judgment intact, Mood & affect appropriate for pt's clinical situation. Dermatologic: No Venous rashes. No Ulcers Noted.  No changes consistent with cellulitis. Lymph : No Cervical lymphadenopathy, no lichenification or skin changes of chronic lymphedema.       ASSESSMENT AND PLAN:  1. Pain of left lower extremity Recommend:  I do not find evidence of Vascular pathology that would explain the patient's symptoms  The patient has atypical pain symptoms for vascular disease  I do not find evidence of Vascular pathology that would explain the patient's symptoms and I suspect the patient is c/o neuropathy.    Noninvasive studies including venous ultrasound of the legs do not identify vascular problems  The patient should continue walking and begin a more formal exercise program. The patient should continue his antiplatelet therapy and aggressive treatment of the lipid abnormalities. The patient should begin wearing graduated compression socks 15-20 mmHg strength to control her mild edema.   Further work-up of her lower extremity pain  is deferred to the primary service     2. Essential hypertension Today the patient's blood pressure is elevated.  However, the patient recently started new blood pressure medications.  Patient is currently working with PCP to adjust medications.   Current Outpatient Medications on File Prior to Visit  Medication Sig Dispense Refill  . aspirin EC 81 MG tablet Take 81 mg daily by mouth.    [name] acetaminophen (MAPAP) 325 MG tablet Take 1 tablet (325 mg total) by mouth every 4 (four) hours as needed for mild pain.    [name] amLODipine (NORVASC) 10 MG tablet Take 1 tablet (10 mg total) by mouth daily. 30 tablet 0  . calcium acetate (PHOSLO) 667 MG capsule Take 2 capsules (1,334 mg total) by mouth 3 (three) times daily with meals. (Patient not taking: Reported on 09/22/2019) 180 capsule 0  . calcium carbonate (TUMS EX) 750 MG chewable tablet Chew 2 tablets by mouth at bedtime.    [name] dexamethasone (DECADRON) 2 MG tablet 2 tabs po day1,2; 1 tab po day3,4; 1/2 tab day5,6 (Patient not taking: Reported on 09/22/2019) 7 tablet 0  . famotidine (PEPCID) 20 MG tablet Take 1 tablet (20 mg total) by mouth every other day. (Patient not taking: Reported on 09/22/2019) 15 tablet 0  . Insulin Detemir (LEVEMIR FLEXTOUCH) 100 UNIT/ML Pen Inject 10 Units into the skin at bedtime. 15 mL 11  . Ipratropium-Albuterol (COMBIVENT) 20-100 MCG/ACT AERS respimat Inhale 1 puff into the lungs every 6 (six) hours as needed for wheezing or shortness of breath. (Patient not taking: Reported on 09/22/2019) 4 g 0  . lanthanum (FOSRENOL) 1000 MG chewable tablet Chew 3,000 mg by mouth 3 (three) times daily.    [name] lisinopril (ZESTRIL) 40 MG tablet Take 1 tablet (40 mg total) by mouth daily. (Patient not taking: Reported on 09/22/2019) 30 tablet 0  . metoprolol succinate (TOPROL-XL) 50 MG 24 hr tablet Take 1 tablet (50 mg total) by mouth daily. Take with or immediately following a meal. 30 tablet 0  . rOPINIRole (REQUIP) 2 MG tablet Take 2 mg by mouth  at bedtime.    . vitamin C (VITAMIN C) 500 MG tablet Take 1 tablet (500 mg total) by mouth daily. (Patient not  taking: Reported on 09/22/2019) 30 tablet 0  . zinc sulfate 220 (50 Zn) MG capsule Take 1 capsule (220 mg total) by mouth daily. (Patient not taking: Reported on 09/22/2019) 30 capsule 0   No current facility-administered medications on file prior to visit.    There are no Patient Instructions on file for this visit. No follow-ups on file.   Kris Hartmann, NP  This note was completed with Sales executive.  Any errors are purely unintentional.

## 2019-11-24 ENCOUNTER — Ambulatory Visit: Payer: Medicare Other | Admitting: Podiatry

## 2019-12-15 ENCOUNTER — Other Ambulatory Visit: Payer: Self-pay

## 2019-12-15 ENCOUNTER — Ambulatory Visit (INDEPENDENT_AMBULATORY_CARE_PROVIDER_SITE_OTHER): Payer: Medicare Other | Admitting: Podiatry

## 2019-12-15 DIAGNOSIS — E0843 Diabetes mellitus due to underlying condition with diabetic autonomic (poly)neuropathy: Secondary | ICD-10-CM | POA: Diagnosis not present

## 2019-12-15 DIAGNOSIS — M79675 Pain in left toe(s): Secondary | ICD-10-CM | POA: Diagnosis not present

## 2019-12-15 DIAGNOSIS — B351 Tinea unguium: Secondary | ICD-10-CM

## 2019-12-15 DIAGNOSIS — M79674 Pain in right toe(s): Secondary | ICD-10-CM

## 2019-12-15 DIAGNOSIS — L989 Disorder of the skin and subcutaneous tissue, unspecified: Secondary | ICD-10-CM | POA: Diagnosis not present

## 2019-12-20 NOTE — Progress Notes (Signed)
    Subjective: Patient is a 64 y.o. male presenting to the office today with a chief complaint of painful callus lesion(s) noted to the bilateral feet. Walking and bearing weight increases the pain. He has not done anything at home for treatment.  Patient also complains of elongated, thickened nails that cause pain while ambulating in shoes. He is unable to trim his own nails. Patient presents today for further treatment and evaluation.  Past Medical History:  Diagnosis Date  . CKD (chronic kidney disease) stage 5, GFR less than 15 ml/min (HCC)   . COVID-19   . Diabetes mellitus without complication ([place])   . Dyspnea   . GERD (gastroesophageal reflux disease)   . Hypertension   . Lower extremity edema   . Myocardial infarction [facility])     Objective:  Physical Exam General: Alert and oriented x3 in no acute distress  Dermatology: Hyperkeratotic lesion(s) present on the bilateral feet. Pain on palpation with a central nucleated core noted. Skin is warm, dry and supple bilateral lower extremities. Negative for open lesions or macerations. Nails are tender, long, thickened and dystrophic with subungual debris, consistent with onychomycosis, 1-5 left, 2-4 right. No signs of infection noted.  Vascular: Palpable pedal pulses bilaterally. No edema or erythema noted. Capillary refill within normal limits.  Neurological: Epicritic and protective threshold diminished bilaterally.   Musculoskeletal Exam: Pain on palpation at the keratotic lesion(s) noted. Range of motion within normal limits bilateral. Muscle strength 5/5 in all groups bilateral.  Assessment: 1. Onychodystrophic nails 1-5 bilateral with hyperkeratosis of nails.  2. Onychomycosis of nail due to dermatophyte bilateral 3. Pre-ulcerative callus lesions noted to the bilateral feet x 4 4. 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 Patient evaluated. 2. Excisional debridement of keratoic lesion(s) using a chisel  blade was performed without incident.  3. Dressed with light dressing. 4. Mechanical debridement of nails 1-5 bilaterally performed using a nail nipper. Filed with dremel without incident.  5. Appointment with Liliane Channel, Pedorthist, for DM shoes.  6. Patient is to return to the clinic in 3 months.   Edrick Kins, DPM [facility]  Dr. Edrick Kins, [address]                                        [place], [place] [address]                Office [phone]  Fax [phone]

## 2020-01-17 ENCOUNTER — Other Ambulatory Visit: Payer: Self-pay

## 2020-01-17 ENCOUNTER — Ambulatory Visit (INDEPENDENT_AMBULATORY_CARE_PROVIDER_SITE_OTHER): Payer: Medicare Other | Admitting: Orthotics

## 2020-01-17 DIAGNOSIS — M79675 Pain in left toe(s): Secondary | ICD-10-CM

## 2020-01-17 DIAGNOSIS — S98131S Complete traumatic amputation of one right lesser toe, sequela: Secondary | ICD-10-CM

## 2020-01-17 DIAGNOSIS — L97512 Non-pressure chronic ulcer of other part of right foot with fat layer exposed: Secondary | ICD-10-CM | POA: Diagnosis not present

## 2020-01-17 DIAGNOSIS — E0843 Diabetes mellitus due to underlying condition with diabetic autonomic (poly)neuropathy: Secondary | ICD-10-CM

## 2020-01-17 DIAGNOSIS — M79674 Pain in right toe(s): Secondary | ICD-10-CM

## 2020-01-17 DIAGNOSIS — B351 Tinea unguium: Secondary | ICD-10-CM

## 2020-01-17 NOTE — Progress Notes (Signed)

## 2020-03-22 ENCOUNTER — Ambulatory Visit: Payer: Medicare Other | Admitting: Podiatry

## 2020-04-18 ENCOUNTER — Other Ambulatory Visit (INDEPENDENT_AMBULATORY_CARE_PROVIDER_SITE_OTHER): Payer: Self-pay | Admitting: Nurse Practitioner

## 2020-04-18 DIAGNOSIS — N186 End stage renal disease: Secondary | ICD-10-CM

## 2020-04-19 ENCOUNTER — Encounter (INDEPENDENT_AMBULATORY_CARE_PROVIDER_SITE_OTHER): Payer: Medicare Other

## 2020-04-19 ENCOUNTER — Ambulatory Visit (INDEPENDENT_AMBULATORY_CARE_PROVIDER_SITE_OTHER): Payer: Medicare Other | Admitting: Vascular Surgery

## 2020-05-10 ENCOUNTER — Ambulatory Visit (INDEPENDENT_AMBULATORY_CARE_PROVIDER_SITE_OTHER): Payer: Medicare Other | Admitting: Podiatry

## 2020-05-10 ENCOUNTER — Other Ambulatory Visit: Payer: Self-pay

## 2020-05-10 ENCOUNTER — Encounter: Payer: Self-pay | Admitting: Podiatry

## 2020-05-10 DIAGNOSIS — M79675 Pain in left toe(s): Secondary | ICD-10-CM

## 2020-05-10 DIAGNOSIS — L989 Disorder of the skin and subcutaneous tissue, unspecified: Secondary | ICD-10-CM

## 2020-05-10 DIAGNOSIS — M79674 Pain in right toe(s): Secondary | ICD-10-CM

## 2020-05-10 DIAGNOSIS — E0843 Diabetes mellitus due to underlying condition with diabetic autonomic (poly)neuropathy: Secondary | ICD-10-CM

## 2020-05-10 DIAGNOSIS — B351 Tinea unguium: Secondary | ICD-10-CM

## 2020-05-10 NOTE — Progress Notes (Signed)
    Subjective: Patient is a 64 y.o. male presenting to the office today with a chief complaint of painful callus lesion(s) noted to the bilateral feet. Walking and bearing weight increases the pain. He has not done anything at home for treatment.  Patient also complains of elongated, thickened nails that cause pain while ambulating in shoes. He is unable to trim his own nails. Patient presents today for further treatment and evaluation.  Past Medical History:  Diagnosis Date  . CKD (chronic kidney disease) stage 5, GFR less than 15 ml/min (HCC)   . COVID-19   . Diabetes mellitus without complication ([place])   . Dyspnea   . GERD (gastroesophageal reflux disease)   . Hypertension   . Lower extremity edema   . Myocardial infarction [facility])     Objective:  Physical Exam General: Alert and oriented x3 in no acute distress  Dermatology: Hyperkeratotic lesion(s) present on the bilateral feet. Pain on palpation with a central nucleated core noted. Skin is warm, dry and supple bilateral lower extremities. Negative for open lesions or macerations. Nails are tender, long, thickened and dystrophic with subungual debris, consistent with onychomycosis, 1-5 left, 2-4 right. No signs of infection noted.  Vascular: Palpable pedal pulses bilaterally. No edema or erythema noted. Capillary refill within normal limits.  Neurological: Epicritic and protective threshold diminished bilaterally.   Musculoskeletal Exam: Pain on palpation at the keratotic lesion(s) noted. Range of motion within normal limits bilateral. Muscle strength 5/5 in all groups bilateral.  Assessment: 1. Onychodystrophic nails 1-5 bilateral with hyperkeratosis of nails.  2. Onychomycosis of nail due to dermatophyte bilateral 3. Pre-ulcerative callus lesions noted to the bilateral feet x 4 4. 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 Patient evaluated. 2. Excisional debridement of keratoic lesion(s) using a chisel  blade was performed without incident.  3. Dressed with light dressing. 4. Mechanical debridement of nails 1-5 bilaterally performed using a nail nipper. Filed with dremel without incident.  5. Appointment with Liliane Channel, Pedorthist, for DM shoes.  6. Patient is to return to the clinic in 3 months.   Edrick Kins, DPM [facility]  Dr. Edrick Kins, [place]                                        [place], [place] [address]                Office [phone]  Fax [phone]

## 2020-07-24 ENCOUNTER — Emergency Department: Payer: BC Managed Care – PPO

## 2020-07-24 ENCOUNTER — Other Ambulatory Visit: Payer: Self-pay

## 2020-07-24 ENCOUNTER — Observation Stay
Admission: EM | Admit: 2020-07-24 | Discharge: 2020-07-25 | Disposition: A | Discharge status: Home / Self Care | Payer: BC Managed Care – PPO | Attending: Internal Medicine | Admitting: Internal Medicine

## 2020-07-24 ENCOUNTER — Encounter: Payer: Self-pay | Admitting: Emergency Medicine

## 2020-07-24 DIAGNOSIS — I5033 Acute on chronic diastolic (congestive) heart failure: Secondary | ICD-10-CM | POA: Diagnosis not present

## 2020-07-24 DIAGNOSIS — N189 Chronic kidney disease, unspecified: Secondary | ICD-10-CM | POA: Diagnosis present

## 2020-07-24 DIAGNOSIS — E1122 Type 2 diabetes mellitus with diabetic chronic kidney disease: Secondary | ICD-10-CM | POA: Diagnosis not present

## 2020-07-24 DIAGNOSIS — Z7982 Long term (current) use of aspirin: Secondary | ICD-10-CM | POA: Diagnosis not present

## 2020-07-24 DIAGNOSIS — Z79899 Other long term (current) drug therapy: Secondary | ICD-10-CM | POA: Insufficient documentation

## 2020-07-24 DIAGNOSIS — I132 Hypertensive heart and chronic kidney disease with heart failure and with stage 5 chronic kidney disease, or end stage renal disease: Secondary | ICD-10-CM | POA: Insufficient documentation

## 2020-07-24 DIAGNOSIS — Z8616 Personal history of COVID-19: Secondary | ICD-10-CM | POA: Insufficient documentation

## 2020-07-24 DIAGNOSIS — I10 Essential (primary) hypertension: Secondary | ICD-10-CM

## 2020-07-24 DIAGNOSIS — F32A Depression, unspecified: Secondary | ICD-10-CM | POA: Diagnosis present

## 2020-07-24 DIAGNOSIS — E8770 Fluid overload, unspecified: Secondary | ICD-10-CM | POA: Diagnosis present

## 2020-07-24 DIAGNOSIS — R0902 Hypoxemia: Secondary | ICD-10-CM | POA: Diagnosis not present

## 2020-07-24 DIAGNOSIS — Z794 Long term (current) use of insulin: Secondary | ICD-10-CM | POA: Insufficient documentation

## 2020-07-24 DIAGNOSIS — N186 End stage renal disease: Secondary | ICD-10-CM | POA: Diagnosis not present

## 2020-07-24 DIAGNOSIS — I2510 Atherosclerotic heart disease of native coronary artery without angina pectoris: Secondary | ICD-10-CM | POA: Diagnosis present

## 2020-07-24 DIAGNOSIS — J9621 Acute and chronic respiratory failure with hypoxia: Secondary | ICD-10-CM | POA: Diagnosis present

## 2020-07-24 DIAGNOSIS — Z992 Dependence on renal dialysis: Secondary | ICD-10-CM | POA: Diagnosis not present

## 2020-07-24 DIAGNOSIS — E875 Hyperkalemia: Secondary | ICD-10-CM | POA: Diagnosis present

## 2020-07-24 DIAGNOSIS — R0602 Shortness of breath: Secondary | ICD-10-CM | POA: Diagnosis present

## 2020-07-24 DIAGNOSIS — Z20822 Contact with and (suspected) exposure to covid-19: Secondary | ICD-10-CM | POA: Insufficient documentation

## 2020-07-24 DIAGNOSIS — I160 Hypertensive urgency: Secondary | ICD-10-CM | POA: Diagnosis present

## 2020-07-24 DIAGNOSIS — D631 Anemia in chronic kidney disease: Secondary | ICD-10-CM | POA: Diagnosis present

## 2020-07-24 DIAGNOSIS — J810 Acute pulmonary edema: Secondary | ICD-10-CM | POA: Diagnosis not present

## 2020-07-24 LAB — BASIC METABOLIC PANEL
Anion gap: 18 — ABNORMAL HIGH (ref 5–15)
BUN: 96 mg/dL — ABNORMAL HIGH (ref 8–23)
CO2: 25 mmol/L (ref 22–32)
Calcium: 8.7 mg/dL — ABNORMAL LOW (ref 8.9–10.3)
Chloride: 93 mmol/L — ABNORMAL LOW (ref 98–111)
Creatinine, Ser: 17.4 mg/dL — ABNORMAL HIGH (ref 0.61–1.24)
GFR, Estimated: 3 mL/min — ABNORMAL LOW (ref >60)
Glucose, Bld: 161 mg/dL — ABNORMAL HIGH (ref 70–99)
Potassium: 6.5 mmol/L (ref 3.5–5.1)
Sodium: 136 mmol/L (ref 135–145)

## 2020-07-24 LAB — CBC
HCT: 31.3 % — ABNORMAL LOW (ref 39.0–52.0)
Hemoglobin: 10.3 g/dL — ABNORMAL LOW (ref 13.0–17.0)
MCH: 31.7 pg (ref 26.0–34.0)
MCHC: 32.9 g/dL (ref 30.0–36.0)
MCV: 96.3 fL (ref 80.0–100.0)
Platelets: 152 10*3/uL (ref 150–400)
RBC: 3.25 MIL/uL — ABNORMAL LOW (ref 4.22–5.81)
RDW: 13.3 % (ref 11.5–15.5)
WBC: 9.5 10*3/uL (ref 4.0–10.5)
nRBC: 0 % (ref 0.0–0.2)

## 2020-07-24 LAB — RESP PANEL BY RT-PCR (FLU A&B, COVID) ARPGX2
Influenza A by PCR: NEGATIVE
Influenza B by PCR: NEGATIVE
SARS Coronavirus 2 by RT PCR: NEGATIVE

## 2020-07-24 LAB — CBG MONITORING, ED
Glucose-Capillary: 150 mg/dL — ABNORMAL HIGH (ref 70–99)
Glucose-Capillary: 127 mg/dL — ABNORMAL HIGH (ref 70–99)
Glucose-Capillary: 229 mg/dL — ABNORMAL HIGH (ref 70–99)

## 2020-07-24 MED ORDER — CLONIDINE HCL 0.1 MG PO TABS
0.2000 mg | ORAL_TABLET | Freq: Once | ORAL | Status: AC
  Administered 2020-07-24: 0.2 mg via ORAL

## 2020-07-24 MED ORDER — LOPERAMIDE HCL 2 MG PO CAPS
2.0000 mg | ORAL_CAPSULE | ORAL | Status: DC | PRN
  Administered 2020-07-24: 2 mg via ORAL
  Filled 2020-07-24: qty 1

## 2020-07-24 MED ORDER — ACETAMINOPHEN 325 MG PO TABS
650.0000 mg | ORAL_TABLET | Freq: Four times a day (QID) | ORAL | Status: DC | PRN
  Administered 2020-07-24: 650 mg via ORAL

## 2020-07-24 MED ORDER — HYDRALAZINE HCL 20 MG/ML IJ SOLN
10.0000 mg | Freq: Once | INTRAMUSCULAR | Status: DC
  Filled 2020-07-24: qty 1

## 2020-07-24 MED ORDER — DM-GUAIFENESIN ER 30-600 MG PO TB12: 1.0000 | ORAL_TABLET | Freq: Two times a day (BID) | ORAL | Status: DC | PRN

## 2020-07-24 MED ORDER — ALBUTEROL SULFATE HFA 108 (90 BASE) MCG/ACT IN AERS
2.0000 | INHALATION_SPRAY | RESPIRATORY_TRACT | Status: DC | PRN
  Filled 2020-07-24: qty 6.7

## 2020-07-24 MED ORDER — HYDROXYZINE HCL 10 MG PO TABS
10.0000 mg | ORAL_TABLET | Freq: Three times a day (TID) | ORAL | Status: DC | PRN
  Filled 2020-07-24: qty 1

## 2020-07-24 MED ORDER — LOPERAMIDE HCL 2 MG PO CAPS: 2.0000 mg | ORAL_CAPSULE | Freq: Once | ORAL | Status: DC

## 2020-07-24 MED ORDER — SODIUM ZIRCONIUM CYCLOSILICATE 10 G PO PACK
10.0000 g | PACK | Freq: Once | ORAL | Status: DC
  Filled 2020-07-24: qty 1

## 2020-07-24 MED ORDER — INSULIN ASPART 100 UNIT/ML IV SOLN
5.0000 [IU] | Freq: Once | INTRAVENOUS | Status: DC
  Filled 2020-07-24: qty 0.05

## 2020-07-24 MED ORDER — NITROGLYCERIN 2 % TD OINT
1.0000 [in_us] | TOPICAL_OINTMENT | Freq: Once | TRANSDERMAL | Status: DC
  Filled 2020-07-24: qty 1

## 2020-07-24 MED ORDER — CALCIUM GLUCONATE-NACL 1-0.675 GM/50ML-% IV SOLN
1.0000 g | Freq: Once | INTRAVENOUS | Status: DC
  Filled 2020-07-24: qty 50

## 2020-07-24 MED ORDER — HYDRALAZINE HCL 20 MG/ML IJ SOLN
5.0000 mg | INTRAMUSCULAR | Status: DC | PRN
  Administered 2020-07-24: 5 mg via INTRAVENOUS
  Filled 2020-07-24: qty 1

## 2020-07-24 MED ORDER — DEXTROSE 50 % IV SOLN
50.0000 mL | Freq: Once | INTRAVENOUS | Status: DC
  Filled 2020-07-24: qty 50

## 2020-07-24 MED ORDER — SODIUM CHLORIDE 0.9 % IV SOLN
500.0000 mg | Freq: Once | INTRAVENOUS | Status: AC
  Administered 2020-07-24: 500 mg via INTRAVENOUS
  Filled 2020-07-24: qty 500

## 2020-07-24 MED ORDER — INSULIN ASPART 100 UNIT/ML ~~LOC~~ SOLN
0.0000 [IU] | SUBCUTANEOUS | Status: DC
  Administered 2020-07-24: 3 [IU] via SUBCUTANEOUS
  Filled 2020-07-24: qty 1

## 2020-07-24 NOTE — ED Triage Notes (Signed)
ARrives from [facility] for low sats.  Patient states he has had a 2-3 day history of dry cough.  Wears 2l/ [place] at home at night usually.  Presents to ED with sats 88-89% on 2l. Dry cough noted.  No SOB/ DOE.

## 2020-07-24 NOTE — ED Notes (Signed)
Sent NP Randol Kern a message regarding pt not wanted to be admitted and wanting to go home

## 2020-07-24 NOTE — H&P (Signed)
History and Physical    Donald Silva:[id number] DOB: 1955/09/07 DOA: 07/24/2020  Referring MD/NP/PA:   PCP: Letta Median, MD   Patient coming from:  The patient is coming from home.  At baseline, pt is independent for most of ADL.        Chief Complaint: cough and SOB  HPI: Donald Silva is a 64 y.o. male with medical history significant of ESRD-HD (TTS), HTN, DM, depression, CAD, dCHF, anemia, who presents with cough  Pt states that he missed dialysis on Tuesday.  His last dialysis was on last Saturday.  He developed shortness breath which has been going on for 3 days.  Patient has dry cough.  No chest pain, fever or chills.  Patient does not have nausea vomiting, abdominal pain.  Patient states he had one episode of diarrhea in ED.  No symptoms of UTI or unilateral weakness. Pt is wearing 2 L of nasal cannula oxygen at night as needed.  Patient found to be satting around 88% on 2 L in the emergency department.  Currently needing 5 L with sats in the 97s.     ED Course: pt was found to have WBC 9.5, pending COVID-19 PCR, potassium 6.5, bicarbonate 25, creatinine 17.4, BUN 96, temperature 99.4, blood pressure 204/104, heart rate 78, RR 16, chest x-ray with cardiomegaly, vascular congestion in the ear defined airspace disease bilaterally.  Patient is placed on progressive bed for observation, Dr. Juleen China for nephrology is consulted for dialysis.  Review of Systems:   General: no fevers, chills, no body weight gain, has fatigue HEENT: no blurry vision, hearing changes or sore throat Respiratory: has dyspnea, coughing, no wheezing CV: no chest pain, no palpitations GI: no nausea, vomiting, abdominal pain, has diarrhea, no constipation GU: no dysuria, burning on urination, increased urinary frequency, hematuria  Ext: has trace leg edema Neuro: no unilateral weakness, numbness, or tingling, no vision change or hearing loss Skin: no rash, no skin tear. MSK: No muscle spasm, no  deformity, no limitation of range of movement in spin Heme: No easy bruising.  Travel history: No recent long distant travel.  Allergy:  Allergies  Allergen Reactions  . Ciprofloxacin Other (See Comments)  . Penicillins Anaphylaxis    Was told it could kill him, pt unsure of reaction  Has patient had a PCN reaction causing immediate rash, facial/tongue/throat swelling, SOB or lightheadedness with hypotension: No Has patient had a PCN reaction causing severe rash involving mucus membranes or skin necrosis: No Has patient had a PCN reaction that required hospitalization: No Has patient had a PCN reaction occurring within the last 10 years: No If all of the above answers are "NO", then may proceed with Cephalosporin use.   . Cyclopentolate Other (See Comments)    Past Medical History:  Diagnosis Date  . CKD (chronic kidney disease) stage 5, GFR less than 15 ml/min (HCC)   . COVID-19   . Diabetes mellitus without complication ([place])   . Dyspnea   . GERD (gastroesophageal reflux disease)   . Hypertension   . Lower extremity edema   . Myocardial infarction [facility])     Past Surgical History:  Procedure Laterality Date  . A/V FISTULAGRAM N/A 05/03/2018   Procedure: A/V FISTULAGRAM;  Surgeon: Katha Cabal, MD;  Location: [place] [facility];  Service: Cardiovascular;  Laterality: N/A;  . AV FISTULA PLACEMENT Left 08/27/2017   Procedure: ARTERIOVENOUS (AV) FISTULA CREATION ( BRACHIAL CEPHALIC );  Surgeon: Katha Cabal, MD;  Location: [facility] ORS;  Service: Vascular;  Laterality: Left;  . CHOLECYSTECTOMY    . TOE SURGERY Right     Social History:  reports that he has never smoked. He has never used smokeless tobacco. He reports that he does not drink alcohol and does not use drugs.  Family History:  Family History  Problem Relation Age of Onset  . Hypertension Sister   . Diabetes Sister   . Diabetes Father      Prior to Admission medications   Medication Sig Start Date  End Date Taking? Authorizing Provider  amLODipine (NORVASC) 10 MG tablet Take 1 tablet (10 mg total) by mouth daily. 08/12/19   Carrie Mew, MD  aspirin EC 81 MG tablet Take 81 mg daily by mouth.    [provider]  calcium carbonate (TUMS EX) 750 MG chewable tablet Chew 2 tablets by mouth at bedtime.    [provider]  Insulin Detemir (LEVEMIR FLEXTOUCH) 100 UNIT/ML Pen Inject 10 Units into the skin at bedtime. 07/22/19   Loletha Grayer, MD  lanthanum (FOSRENOL) 1000 MG chewable tablet Chew 3,000 mg by mouth 3 (three) times daily. 09/13/19   [provider]  lisinopril (ZESTRIL) 40 MG tablet Take 1 tablet (40 mg total) by mouth daily. Patient not taking: Reported on 09/22/2019 08/12/19   Carrie Mew, MD  metoprolol succinate (TOPROL-XL) 50 MG 24 hr tablet Take 1 tablet (50 mg total) by mouth daily. Take with or immediately following a meal. 07/22/19   Loletha Grayer, MD  rOPINIRole (REQUIP) 2 MG tablet Take 2 mg by mouth at bedtime. 09/20/19   [provider]    Physical Exam: Vitals:   07/24/20 1005 07/24/20 1006 07/24/20 1255 07/24/20 1400  BP:  (!) 201/89 (!) 204/104 (!) 189/106  Pulse:  88 78 80  Resp:  16 16 (!) 24  Temp:  99.4 F (37.4 C)    TempSrc:  Oral    SpO2:  95% 97% (!) 80%  Weight: 111.6 kg     Height: 5\' 11"  (1.803 m)      General: Not in acute distress HEENT:       Eyes: PERRL, EOMI, no scleral icterus.       ENT: No discharge from the ears and nose, no pharynx injection, no tonsillar enlargement.        Neck: No JVD, no bruit, no mass felt. Heme: No neck lymph node enlargement. Cardiac: S1/S2, RRR, No murmurs, No gallops or rubs. Respiratory: Has fine crackles bilaterally GI: Soft, nondistended, nontender, no rebound pain, no organomegaly, BS present. GU: No hematuria Ext: has trace leg edema bilaterally. 1+DP/PT pulse bilaterally. Musculoskeletal: No joint deformities, No joint redness or warmth, no limitation of ROM  in spin. Skin: No rashes.  Neuro: Alert, oriented X3, cranial nerves II-XII grossly intact, moves all extremities normally. Psych: Patient is not psychotic, no suicidal or hemocidal ideation.  Labs on Admission: I have personally reviewed following labs and imaging studies  CBC: Recent Labs  Lab 07/24/20 1008  WBC 9.5  HGB 10.3*  HCT 31.3*  MCV 96.3  PLT 176   Basic Metabolic Panel: Recent Labs  Lab 07/24/20 1008  NA 136  K 6.5*  CL 93*  CO2 25  GLUCOSE 161*  BUN 96*  CREATININE 17.40*  CALCIUM 8.7*   GFR: Estimated Creatinine Clearance: 5.3 mL/min (A) (by C-G formula based on SCr of 17.4 mg/dL (H)). Liver Function Tests: No results for input(s): AST, ALT, ALKPHOS, BILITOT, PROT, ALBUMIN  in the last 168 hours. No results for input(s): LIPASE, AMYLASE in the last 168 hours. No results for input(s): AMMONIA in the last 168 hours. Coagulation Profile: No results for input(s): INR, PROTIME in the last 168 hours. Cardiac Enzymes: No results for input(s): CKTOTAL, CKMB, CKMBINDEX, TROPONINI in the last 168 hours. BNP (last 3 results) No results for input(s): PROBNP in the last 8760 hours. HbA1C: No results for input(s): HGBA1C in the last 72 hours. CBG: Recent Labs  Lab 07/24/20 1011  GLUCAP 150*   Lipid Profile: No results for input(s): CHOL, HDL, LDLCALC, TRIG, CHOLHDL, LDLDIRECT in the last 72 hours. Thyroid Function Tests: No results for input(s): TSH, T4TOTAL, FREET4, T3FREE, THYROIDAB in the last 72 hours. Anemia Panel: No results for input(s): VITAMINB12, FOLATE, FERRITIN, TIBC, IRON, RETICCTPCT in the last 72 hours. Urine analysis:    Component Value Date/Time   COLORURINE Straw 11/19/2013 1458   APPEARANCEUR Clear 11/19/2013 1458   LABSPEC 1.008 11/19/2013 1458   PHURINE 6.0 11/19/2013 1458   GLUCOSEU 150 mg/dL 11/19/2013 1458   HGBUR 2+ 11/19/2013 1458   BILIRUBINUR Negative 11/19/2013 1458   KETONESUR Negative 11/19/2013 1458   PROTEINUR 30  mg/dL 11/19/2013 1458   NITRITE Negative 11/19/2013 1458   LEUKOCYTESUR Negative 11/19/2013 1458   Sepsis Labs: @LABRCNTIP (procalcitonin:4,lacticidven:4) )No results found for this or any previous visit (from the past 240 hour(s)).   Radiological Exams on Admission: DG Chest 2 View  Result Date: 07/24/2020 CLINICAL DATA:  Cough and shortness of breath EXAM: CHEST - 2 VIEW COMPARISON:  August 12, 2019 FINDINGS: There is patchy ill-defined airspace opacity bilaterally. No consolidation. Heart is mildly enlarged with a degree of pulmonary venous hypertension. No adenopathy. No bone lesions. IMPRESSION: Cardiomegaly with a degree of pulmonary vascular congestion. Ill-defined areas of airspace opacity raise concern for atypical organism pneumonia. Check of COVID-19 status in this regard advised. Note that the pulmonary edema could present in this manner. Both edema and pneumonia may present concurrently. Electronically Signed   By: Lowella Grip III M.D.   On: 07/24/2020 10:44     EKG: I have personally reviewed.  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  Patient missed dialysis on Tuesday.  2D echo on 05/23/2019 showed EF of 60 to 65%.  Dr. Juleen China of renal is consulted for dialysis.  Patient received 1 dose of azithromycin, but patient does  not have fever or leukocytosis.  Clinically does not seem to have pneumonia.  Will hold off antibiotics now.  -Place to progressive unit for observation - pt was given nitroglycerin patch 15 mg in Ed -Daily weights -strict I/O's -Low salt diet -Fluid restriction with renal diet -Obtain REDs Vest reading -prn albuterol  Hyperkalemia: K=6.5 -Treated with D50, NovoLog 5 units, 1 g of calcium gluconate -Leukoma 10 g -renal is consulted for HD  Essential hypertension and hypertensive urgency: Bp  204/104 - pt was given nitroglycerin patch 15 mg in Ed -As needed hydralazine IV -Continue amlodipine, lisinopril, metoprolol  Type 2 diabetes mellitus with ESRD (end-stage renal disease) ([place]): Recent A1c 6.5, well controlled. -SSI -Decrease Levemir dose from 10 units to 7 units daily  ESRD (end stage renal disease) on dialysis (TTS): -renal is consulted for HD  Anemia in chronic kidney disease: Hgb 10.3, stable -f/u by CBC  Depression: -continue Requip  CAD (coronary artery disease) -continue ASA          DVT ppx: SQ Heparin          Code Status: Full code Family Communication: not done, no family member is at bed side. Disposition Plan:  Anticipate discharge back to previous environment Consults called: Dr. Juleen China of renal Admission status:  progressive unit for obs     Status is: Observation  The patient remains OBS appropriate and will d/c before 2 midnights.  Dispo: The patient is from: Home              Anticipated d/c is to: Home              Anticipated d/c date is: 1 day              Patient currently is not medically stable to d/c.         Date of Service 07/24/2020    [facility]   If 7PM-7AM, please contact night-coverage [email or web address] 07/24/2020, 2:51 PM

## 2020-07-24 NOTE — ED Provider Notes (Addendum)
[facility] Emergency Department Provider Note  Time seen: 2:15 PM  I have reviewed the triage vital signs and the nursing notes.   HISTORY  Chief Complaint Hypoxia/shortness of breath  HPI Donald Silva is a 64 y.o. male with a past medical history of ESRD on HD Tuesday/Thursday/Saturday, diabetes, gastric reflux, hypertension, presents to the emergency department for shortness of breath.  According to the patient for the past 3 days he has been feeling short of breath with a dry cough.  Patient states he missed dialysis on Tuesday.  Patient does have a wet sounding cough, as prescribed 2 L of nasal cannula oxygen at night as needed.  Patient found to be satting around 80% on 2 L in the emergency department.  Currently needing 5 L to maintain sats in the 90s.  Patient denies any chest pain.  Has received Covid vaccinations.  Denies any fever.   Past Medical History:  Diagnosis Date  . CKD (chronic kidney disease) stage 5, GFR less than 15 ml/min (HCC)   . COVID-19   . Diabetes mellitus without complication ([place])   . Dyspnea   . GERD (gastroesophageal reflux disease)   . Hypertension   . Lower extremity edema   . Myocardial infarction [facility])     Patient Active Problem List   Diagnosis Date Noted  . COVID-19 07/26/2019  . Hypervolemia   . RLS (restless legs syndrome)   . Acute and chronic respiratory failure (acute-on-chronic) ([place]) 07/18/2019  . Pneumonia due to COVID-19 virus 07/18/2019  . Acute respiratory failure ([place]) 06/13/2019  . Flash pulmonary edema ([place])   . ESRD (end stage renal disease) on dialysis ([place])   . Acute CHF (congestive heart failure) ([place]) 05/22/2019  . Postop check 03/02/2019  . Generalized abdominal pain 01/31/2019  . Diabetes 1.5, managed as type 2 ([place]) 12/15/2018  . Complication of vascular access for dialysis 04/20/2018  . Anemia in chronic kidney disease 12/03/2017  . Coagulation defect, unspecified ([place]) 12/03/2017  .  Diarrhea, unspecified 12/03/2017  . Encounter for immunization 12/03/2017  . Fever, unspecified 12/03/2017  . Headache, unspecified 12/03/2017  . Iron deficiency anemia, unspecified 12/03/2017  . Moderate protein-calorie malnutrition ([place]) 12/03/2017  . Pain, unspecified 12/03/2017  . Pruritus, unspecified 12/03/2017  . Secondary hyperparathyroidism of renal origin ([place]) 12/03/2017  . Shortness of breath 12/03/2017  . Chronic kidney disease, stage V ([place]) 08/11/2017  . Essential hypertension 08/11/2017  . Type 2 diabetes mellitus with ESRD (end-stage renal disease) ([place]) 08/11/2017  . Hyperkalemia 06/24/2017  . Multiple-type hyperlipidemia 03/25/2012  . Vitamin D deficiency 03/25/2012  . ED (erectile dysfunction) of organic origin 09/14/2011    Past Surgical History:  Procedure Laterality Date  . A/V FISTULAGRAM N/A 05/03/2018   Procedure: A/V FISTULAGRAM;  Surgeon: Katha Cabal, MD;  Location: [facility];  Service: Cardiovascular;  Laterality: N/A;  . AV FISTULA PLACEMENT Left 08/27/2017   Procedure: ARTERIOVENOUS (AV) FISTULA CREATION ( BRACHIAL CEPHALIC );  Surgeon: Katha Cabal, MD;  Location: [facility] ORS;  Service: Vascular;  Laterality: Left;  . CHOLECYSTECTOMY    . TOE SURGERY Right     Prior to Admission medications   Medication Sig Start Date End Date Taking? Authorizing Provider  amLODipine (NORVASC) 10 MG tablet Take 1 tablet (10 mg total) by mouth daily. 08/12/19   Carrie Mew, MD  aspirin EC 81 MG tablet Take 81 mg daily by mouth.    [provider]  calcium carbonate (TUMS  EX) 750 MG chewable tablet Chew 2 tablets by mouth at bedtime.    [provider]  Insulin Detemir (LEVEMIR FLEXTOUCH) 100 UNIT/ML Pen Inject 10 Units into the skin at bedtime. 07/22/19   Loletha Grayer, MD  lanthanum (FOSRENOL) 1000 MG chewable tablet Chew 3,000 mg by mouth 3 (three) times daily. 09/13/19   [provider]  lisinopril (ZESTRIL) 40 MG  tablet Take 1 tablet (40 mg total) by mouth daily. Patient not taking: Reported on 09/22/2019 08/12/19   Carrie Mew, MD  metoprolol succinate (TOPROL-XL) 50 MG 24 hr tablet Take 1 tablet (50 mg total) by mouth daily. Take with or immediately following a meal. 07/22/19   Loletha Grayer, MD  rOPINIRole (REQUIP) 2 MG tablet Take 2 mg by mouth at bedtime. 09/20/19   [provider]    Allergies  Allergen Reactions  . Ciprofloxacin Other (See Comments)  . Penicillins Anaphylaxis    Was told it could kill him, pt unsure of reaction  Has patient had a PCN reaction causing immediate rash, facial/tongue/throat swelling, SOB or lightheadedness with hypotension: No Has patient had a PCN reaction causing severe rash involving mucus membranes or skin necrosis: No Has patient had a PCN reaction that required hospitalization: No Has patient had a PCN reaction occurring within the last 10 years: No If all of the above answers are "NO", then may proceed with Cephalosporin use.   . Cyclopentolate Other (See Comments)    Family History  Problem Relation Age of Onset  . Hypertension Sister   . Diabetes Sister   . Diabetes Father     Social History Social History   Tobacco Use  . Smoking status: Never Smoker  . Smokeless tobacco: Never Used  Vaping Use  . Vaping Use: Never used  Substance Use Topics  . Alcohol use: No  . Drug use: No    Review of Systems Constitutional: Negative for fever. Cardiovascular: Negative for chest pain. Respiratory: Positive for shortness of breath.  Positive for cough. Gastrointestinal: Negative for abdominal pain, vomiting Musculoskeletal: Positive for lower extremity swelling. Neurological: Negative for headache All other ROS negative  ____________________________________________   PHYSICAL EXAM:  VITAL SIGNS: ED Triage Vitals  Enc Vitals Group     BP 07/24/20 1006 (!) 201/89     Pulse Rate 07/24/20 1006 88     Resp 07/24/20 1006 16      Temp 07/24/20 1006 99.4 F (37.4 C)     Temp Source 07/24/20 1006 Oral     SpO2 07/24/20 1006 95 %     Weight 07/24/20 1005 246 lb 0.5 oz (111.6 kg)     Height 07/24/20 1005 5\' 11"  (1.803 m)     Head Circumference --      Peak Flow --      Pain Score 07/24/20 1004 0     Pain Loc --      Pain Edu? --      Excl. in [place]? --    Constitutional: Alert and oriented.  Patient is breathing heavy but able to speak in full sentences. Eyes: Normal exam ENT      Head: Normocephalic and atraumatic.      Mouth/Throat: Mucous membranes are moist. Cardiovascular: Normal rate, regular rhythm.  Respiratory: Moderate tachypnea, rhonchi auscultated bilaterally with mild expiratory wheeze bilaterally. Gastrointestinal: Soft and nontender. No distention.   Musculoskeletal: Nontender with normal range of motion in all extremities.  Positive lower extremity edema bilaterally. Neurologic:  Normal speech and language. No  gross focal neurologic deficits  Skin:  Skin is warm, dry and intact.  Psychiatric: Mood and affect are normal.   ____________________________________________    EKG  EKG viewed and interpreted by myself shows a normal sinus rhythm 88 bpm with a widened QRS, normal axis, largely normal intervals, appears to have a right bundle branch block, nonspecific ST changes without ST elevation.  ____________________________________________    RADIOLOGY  Chest x-ray shows cardiomegaly with vascular congestion possible pneumonia possible atypical pneumonia.  ____________________________________________   INITIAL IMPRESSION / ASSESSMENT AND PLAN / ED COURSE  Pertinent labs & imaging results that were available during my care of the patient were reviewed by me and considered in my medical decision making (see chart for details).   Patient presents to the emergency department for shortness of breath worse over the past 3 days along with a dry cough.  Patient is dialysis dependent has not  received dialysis since this past Saturday.  Patient's chest x-ray appears to show fluid overload possible edema versus pneumonia as well.  Patient states a history of pneumonia in the past we will cover with antibiotics as a precaution and check for Covid.  Patient's labs have resulted showing a potassium of 6.4.  Patient is hypertensive greater than 294 systolic.  We will dose 1 inch of nitroglycerin ointment as well as 10 mg of IV hydralazine.  We will dose insulin/glucose as well as calcium gluconate for his hyper kalemia.  I spoke to Dr. Juleen China of nephrology who will arrange for emergent dialysis for the patient in the emergency department.  I spoke to the hospitalist will be admitting the patient.  YARON GRASSE was evaluated in Emergency Department on 07/24/2020 for the symptoms described in the history of present illness. He was evaluated in the context of the global COVID-19 pandemic, which necessitated consideration that the patient might be at risk for infection with the SARS-CoV-2 virus that causes COVID-19. Institutional protocols and algorithms that pertain to the evaluation of patients at risk for COVID-19 are in a state of rapid change based on information released by regulatory bodies including the CDC and federal and state organizations. These policies and algorithms were followed during the patient's care in the ED.  CRITICAL CARE Performed by: Harvest Dark   Total critical care time: 30 minutes  Critical care time was exclusive of separately billable procedures and treating other patients.  Critical care was necessary to treat or prevent imminent or life-threatening deterioration.  Critical care was time spent personally by me on the following activities: development of treatment plan with patient and/or surrogate as well as nursing, discussions with consultants, evaluation of patient's response to treatment, examination of patient, obtaining history from patient or surrogate,  ordering and performing treatments and interventions, ordering and review of laboratory studies, ordering and review of radiographic studies, pulse oximetry and re-evaluation of patient's condition.  ____________________________________________   FINAL CLINICAL IMPRESSION(S) / ED DIAGNOSES  Shortness of breath Pulmonary edema Hypoxia Hyperkalemia   Harvest Dark, MD 07/24/20 1443    Harvest Dark, MD 07/24/20 1445

## 2020-07-24 NOTE — ED Notes (Signed)
Pt brought back from dialysis

## 2020-07-24 NOTE — Progress Notes (Signed)
[facility]  ROUNDING NOTE   Subjective:   Donald Silva was admitted to [facility] on 07/24/2020 for Fluid overload [E87.70]  Last hemodialysis treatment was 07/20/2020  Patient was having upper respiratory symptoms and due to that missed his Tuesday dialysis treatment.    Objective:  Vital signs in last 24 hours:  Temp:  [99.4 F (37.4 C)] 99.4 F (37.4 C) (12/15 1006) Pulse Rate:  [78-88] 80 (12/15 1400) Resp:  [16-24] 24 (12/15 1400) BP: (189-204)/(89-106) 189/106 (12/15 1400) SpO2:  [80 %-97 %] 80 % (12/15 1400) Weight:  [111.6 kg] 111.6 kg (12/15 1005)  Weight change:  Filed Weights   07/24/20 1005  Weight: 111.6 kg    Intake/Output: No intake/output data recorded.   Intake/Output this shift:  No intake/output data recorded.  Physical Exam: General: NAD, sitting up in stretcher  Head: Normocephalic, atraumatic. Moist oral mucosal membranes  Eyes: Anicteric, PERRL  Neck: Supple, trachea midline  Lungs:  Clear to auscultation  Heart: Regular rate and rhythm  Abdomen:  Soft, nontender, obese  Extremities:  + peripheral edema.  Neurologic: Nonfocal, moving all four extremities  Skin: No lesions  Access: Left AVF    Basic Metabolic Panel: Recent Labs  Lab 07/24/20 1008  NA 136  K 6.5*  CL 93*  CO2 25  GLUCOSE 161*  BUN 96*  CREATININE 17.40*  CALCIUM 8.7*    Liver Function Tests: No results for input(s): AST, ALT, ALKPHOS, BILITOT, PROT, ALBUMIN in the last 168 hours. No results for input(s): LIPASE, AMYLASE in the last 168 hours. No results for input(s): AMMONIA in the last 168 hours.  CBC: Recent Labs  Lab 07/24/20 1008  WBC 9.5  HGB 10.3*  HCT 31.3*  MCV 96.3  PLT 152    Cardiac Enzymes: No results for input(s): CKTOTAL, CKMB, CKMBINDEX, TROPONINI in the last 168 hours.  BNP: Invalid input(s): POCBNP  CBG: Recent Labs  Lab 07/24/20 1011  GLUCAP 150*    Microbiology: Results for orders placed or performed  during the hospital encounter of 07/18/19  Respiratory Panel by RT PCR (Flu A&B, Covid) - Nasopharyngeal Swab     Status: Abnormal   Collection Time: 07/18/19  1:03 AM   Specimen: Nasopharyngeal Swab  Result Value Ref Range Status   SARS Coronavirus 2 by RT PCR POSITIVE (A) NEGATIVE Final    Comment: RESULT CALLED TO, READ BACK BY AND VERIFIED WITH: Georgann Housekeeper RN [phone] 07/18/2019 HNM (NOTE) SARS-CoV-2 target nucleic acids are DETECTED. SARS-CoV-2 RNA is generally detectable in upper respiratory specimens  during the acute phase of infection. Positive results are indicative of the presence of the identified virus, but do not rule out bacterial infection or co-infection with other pathogens not detected by the test. Clinical correlation with patient history and other diagnostic information is necessary to determine patient infection status. The expected result is Negative. Fact Sheet for Patients:  PinkCheek.be Fact Sheet for Healthcare Providers: GravelBags.it This test is not yet approved or cleared by the [place] FDA and  has been authorized for detection and/or diagnosis of SARS-CoV-2 by FDA under an Emergency Use Authorization (EUA).  This EUA will remain in effect (meaning this test can be used) fo r the duration of  the COVID-19 declaration under Section 564(b)(1) of the Act, 21 U.S.C. section 360bbb-3(b)(1), unless the authorization is terminated or revoked sooner.    Influenza A by PCR NEGATIVE NEGATIVE Final   Influenza B by PCR NEGATIVE NEGATIVE Final  Comment: (NOTE) The Xpert Xpress SARS-CoV-2/FLU/RSV assay is intended as an aid in  the diagnosis of influenza from Nasopharyngeal swab specimens and  should not be used as a sole basis for treatment. Nasal washings and  aspirates are unacceptable for Xpert Xpress SARS-CoV-2/FLU/RSV  testing. Fact Sheet for  Patients: PinkCheek.be Fact Sheet for Healthcare Providers: GravelBags.it This test is not yet approved or cleared by the [place] FDA and  has been authorized for detection and/or diagnosis of SARS-CoV-2 by  FDA under an Emergency Use Authorization (EUA). This EUA will remain  in effect (meaning this test can be used) for the duration of the  Covid-19 declaration under Section 564(b)(1) of the Act, 21  U.S.C. section 360bbb-3(b)(1), unless the authorization is  terminated or revoked. Performed at [facility], [address] [place], [place] [address]   Culture, blood (Routine X 2) w Reflex to ID Panel     Status: None   Collection Time: 07/18/19  4:33 AM   Specimen: BLOOD  Result Value Ref Range Status   Specimen Description BLOOD DIALYSIS PORT  Final   Special Requests   Final    BOTTLES DRAWN AEROBIC AND ANAEROBIC Blood Culture adequate volume   Culture   Final    NO GROWTH 5 DAYS Performed at [facility], [address], [place], [place] [address]    Report Status 07/23/2019 FINAL  Final  Culture, blood (Routine X 2) w Reflex to ID Panel     Status: None   Collection Time: 07/18/19  4:33 AM   Specimen: BLOOD  Result Value Ref Range Status   Specimen Description BLOOD BLOOD RIGHT HAND  Final   Special Requests   Final    BOTTLES DRAWN AEROBIC AND ANAEROBIC Blood Culture adequate volume   Culture   Final    NO GROWTH 5 DAYS Performed at [facility], [address], [place],  [address]    Report Status 07/23/2019 FINAL  Final    Coagulation Studies: No results for input(s): LABPROT, INR in the last 72 hours.  Urinalysis: No results for input(s): COLORURINE, LABSPEC, PHURINE, GLUCOSEU, HGBUR, BILIRUBINUR, KETONESUR, PROTEINUR, UROBILINOGEN, NITRITE, LEUKOCYTESUR in the last 72 hours.  Invalid input(s): APPERANCEUR    Imaging: DG Chest 2 View  Result Date:  07/24/2020 CLINICAL DATA:  Cough and shortness of breath EXAM: CHEST - 2 VIEW COMPARISON:  August 12, 2019 FINDINGS: There is patchy ill-defined airspace opacity bilaterally. No consolidation. Heart is mildly enlarged with a degree of pulmonary venous hypertension. No adenopathy. No bone lesions. IMPRESSION: Cardiomegaly with a degree of pulmonary vascular congestion. Ill-defined areas of airspace opacity raise concern for atypical organism pneumonia. Check of COVID-19 status in this regard advised. Note that the pulmonary edema could present in this manner. Both edema and pneumonia may present concurrently. Electronically Signed   By: Lowella Grip III M.D.   On: 07/24/2020 10:44     Medications:   . azithromycin (ZITHROMAX) 500 MG IVPB (Vial-Mate [employer]) 500 mg (07/24/20 1427)  . calcium gluconate     . dextrose  50 mL Intravenous Once  . hydrALAZINE  10 mg Intravenous Once  . insulin aspart  0-9 Units Subcutaneous Q4H  . insulin aspart  5 Units Intravenous Once  . nitroGLYCERIN  1 inch Topical Once  . sodium zirconium cyclosilicate  10 g Oral Once   acetaminophen, albuterol, dextromethorphan-guaiFENesin, hydrALAZINE, hydrOXYzine  Assessment/ Plan:  Donald Silva is a 64 y.o. black male with end  stage renal disease on hemodialysis, hypertension, diabetes mellitus type II, GERD, coronary artery disease, who is admitted to [facility] on 07/24/2020 for Fluid overload [E87.70]  [facility] TTS [address] Left AVF 110.5kg.   1. End Stage renal disease: with hyperkalemia Needs emergency hemodialysis treatment today.  - [place]  2. Hypertensive urgency: home regimen of amlodipine, lisinopril, and metoprolol.  - Emergent hemodialysis as above.   3. Anemia of chronic kidney disease: hemoglobin 10.3. normocytic. Not currently getting ESA as outpatient.   4. Secondary Hyperparathyroidism:  - calcitriol and cinacalcet three times a week with dialysis treatment - lanthanum with  meals   LOS: 0 Terrion Poblano 12/15/20212:47 PM

## 2020-07-25 DIAGNOSIS — N186 End stage renal disease: Secondary | ICD-10-CM | POA: Diagnosis not present

## 2020-07-25 DIAGNOSIS — I160 Hypertensive urgency: Secondary | ICD-10-CM

## 2020-07-25 DIAGNOSIS — E8770 Fluid overload, unspecified: Secondary | ICD-10-CM | POA: Diagnosis not present

## 2020-07-25 DIAGNOSIS — E875 Hyperkalemia: Secondary | ICD-10-CM

## 2020-07-25 DIAGNOSIS — J810 Acute pulmonary edema: Secondary | ICD-10-CM | POA: Diagnosis not present

## 2020-07-25 DIAGNOSIS — Z992 Dependence on renal dialysis: Secondary | ICD-10-CM

## 2020-07-25 LAB — BASIC METABOLIC PANEL
Anion gap: 14 (ref 5–15)
BUN: 59 mg/dL — ABNORMAL HIGH (ref 8–23)
CO2: 28 mmol/L (ref 22–32)
Calcium: 8.3 mg/dL — ABNORMAL LOW (ref 8.9–10.3)
Chloride: 98 mmol/L (ref 98–111)
Creatinine, Ser: 12.8 mg/dL — ABNORMAL HIGH (ref 0.61–1.24)
GFR, Estimated: 4 mL/min — ABNORMAL LOW (ref >60)
Glucose, Bld: 72 mg/dL (ref 70–99)
Potassium: 4.9 mmol/L (ref 3.5–5.1)
Sodium: 140 mmol/L (ref 135–145)

## 2020-07-25 LAB — CBC
HCT: 27.3 % — ABNORMAL LOW (ref 39.0–52.0)
Hemoglobin: 9 g/dL — ABNORMAL LOW (ref 13.0–17.0)
MCH: 31.7 pg (ref 26.0–34.0)
MCHC: 33 g/dL (ref 30.0–36.0)
MCV: 96.1 fL (ref 80.0–100.0)
Platelets: 142 10*3/uL — ABNORMAL LOW (ref 150–400)
RBC: 2.84 MIL/uL — ABNORMAL LOW (ref 4.22–5.81)
RDW: 13.2 % (ref 11.5–15.5)
WBC: 5.6 10*3/uL (ref 4.0–10.5)
nRBC: 0 % (ref 0.0–0.2)

## 2020-07-25 LAB — CBG MONITORING, ED
Glucose-Capillary: 59 mg/dL — ABNORMAL LOW (ref 70–99)
Glucose-Capillary: 77 mg/dL (ref 70–99)
Glucose-Capillary: 81 mg/dL (ref 70–99)

## 2020-07-25 LAB — HIV ANTIBODY (ROUTINE TESTING W REFLEX): HIV Screen 4th Generation wRfx: NONREACTIVE

## 2020-07-25 LAB — HEPATITIS B SURFACE ANTIGEN: Hepatitis B Surface Ag: NONREACTIVE

## 2020-07-25 MED ORDER — PREGABALIN 75 MG PO CAPS: 75.0000 mg | ORAL_CAPSULE | Freq: Two times a day (BID) | ORAL | Status: DC

## 2020-07-25 MED ORDER — LISINOPRIL 10 MG PO TABS
40.0000 mg | ORAL_TABLET | Freq: Every day | ORAL | Status: DC
  Administered 2020-07-25: 40 mg via ORAL
  Filled 2020-07-25: qty 4

## 2020-07-25 MED ORDER — PREGABALIN 75 MG PO CAPS
75.0000 mg | ORAL_CAPSULE | Freq: Two times a day (BID) | ORAL | Status: DC
  Administered 2020-07-25: 75 mg via ORAL
  Filled 2020-07-25: qty 1

## 2020-07-25 MED ORDER — ASPIRIN EC 81 MG PO TBEC
81.0000 mg | DELAYED_RELEASE_TABLET | Freq: Every day | ORAL | Status: DC
  Administered 2020-07-25: 81 mg via ORAL
  Filled 2020-07-25: qty 1

## 2020-07-25 MED ORDER — SODIUM CHLORIDE 0.9 % IV SOLN
250.0000 mL | INTRAVENOUS | Status: DC | PRN
Start: 2020-07-25 — End: 2020-07-25

## 2020-07-25 MED ORDER — ASPIRIN EC 81 MG PO TBEC: 81.0000 mg | DELAYED_RELEASE_TABLET | Freq: Every day | ORAL | Status: DC

## 2020-07-25 MED ORDER — ROPINIROLE HCL 1 MG PO TABS
2.0000 mg | ORAL_TABLET | Freq: Every day | ORAL | Status: DC
  Administered 2020-07-25: 2 mg via ORAL
  Filled 2020-07-25: qty 2

## 2020-07-25 MED ORDER — SODIUM CHLORIDE 0.9% FLUSH
3.0000 mL | Freq: Two times a day (BID) | INTRAVENOUS | Status: DC
  Administered 2020-07-25: 3 mL via INTRAVENOUS

## 2020-07-25 MED ORDER — CALCIUM GLUCONATE-NACL 1-0.675 GM/50ML-% IV SOLN
1.0000 g | Freq: Once | INTRAVENOUS | Status: DC
  Filled 2020-07-25: qty 50

## 2020-07-25 MED ORDER — LOPERAMIDE HCL 2 MG PO CAPS: 2.0000 mg | ORAL_CAPSULE | ORAL | 0 refills | Status: DC | PRN

## 2020-07-25 MED ORDER — CALCIUM CARBONATE ANTACID 500 MG PO CHEW: 1500.0000 mg | CHEWABLE_TABLET | Freq: Every day | ORAL | Status: DC

## 2020-07-25 MED ORDER — LANTHANUM CARBONATE 500 MG PO CHEW
3000.0000 mg | CHEWABLE_TABLET | Freq: Three times a day (TID) | ORAL | Status: DC
  Filled 2020-07-25 (×3): qty 6

## 2020-07-25 MED ORDER — EPOETIN ALFA 10000 UNIT/ML IJ SOLN
10000.0000 [IU] | INTRAMUSCULAR | Status: DC
  Filled 2020-07-25: qty 1

## 2020-07-25 MED ORDER — INSULIN DETEMIR 100 UNIT/ML ~~LOC~~ SOLN
7.0000 [IU] | Freq: Every day | SUBCUTANEOUS | Status: DC
  Filled 2020-07-25: qty 0.07

## 2020-07-25 MED ORDER — AMLODIPINE BESYLATE 5 MG PO TABS
10.0000 mg | ORAL_TABLET | Freq: Every day | ORAL | Status: DC
  Filled 2020-07-25: qty 2

## 2020-07-25 MED ORDER — MOMETASONE FURO-FORMOTEROL FUM 100-5 MCG/ACT IN AERO
2.0000 | INHALATION_SPRAY | Freq: Two times a day (BID) | RESPIRATORY_TRACT | Status: DC
  Filled 2020-07-25: qty 8.8

## 2020-07-25 MED ORDER — METOPROLOL SUCCINATE ER 50 MG PO TB24
50.0000 mg | ORAL_TABLET | Freq: Every day | ORAL | Status: DC
  Administered 2020-07-25: 50 mg via ORAL
  Filled 2020-07-25: qty 1

## 2020-07-25 MED ORDER — SODIUM CHLORIDE 0.9% FLUSH: 3.0000 mL | INTRAVENOUS | Status: DC | PRN

## 2020-07-25 MED ORDER — HEPARIN SODIUM (PORCINE) 5000 UNIT/ML IJ SOLN
5000.0000 [IU] | Freq: Three times a day (TID) | INTRAMUSCULAR | Status: DC
  Administered 2020-07-25: 5000 [IU] via SUBCUTANEOUS
  Filled 2020-07-25: qty 1

## 2020-07-25 NOTE — ED Notes (Signed)
IV cath removed intact without complication.  D/C instructions given.  All questions addressed.  Pt left ER via w/c

## 2020-07-25 NOTE — ED Notes (Signed)
Report given to dialysis.   

## 2020-07-25 NOTE — ED Notes (Signed)
Updated pt wife on d/c following dialysis.

## 2020-07-25 NOTE — Discharge Summary (Signed)
Physician Discharge Summary  Donald Silva:[id number] DOB: 06/14/1956 DOA: 07/24/2020  PCP: [facility] date: 07/24/2020 Discharge date: 07/25/2020  Admitted From: home Disposition:  home  Recommendations for Outpatient Follow-up:  1. Follow up with PCP in 1-2 weeks 2. Please obtain BMP/CBC in one week 3. 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 Blaine Hamper: "Donald Silva is a 64 y.o. male with medical history significant of ESRD-HD (TTS), HTN, DM, depression, CAD, dCHF, anemia, who presents with cough  Pt states that he missed dialysis on Tuesday.  His last dialysis was on last Saturday.  He developed shortness breath which has been going on for 3 days.  Patient has dry cough.  No chest pain, fever or chills.  Patient does not have nausea vomiting, abdominal pain.  Patient states he had one episode of diarrhea in ED.  No symptoms of UTI or unilateral weakness. Pt is wearing 2 L of nasal cannula oxygen at night as needed. Patient found to be satting around 88% on 2 L in the emergency department. Currently needing 5 L with sats in the 97s.    ED Course: pt was found to have WBC 9.5, pending COVID-19 PCR, potassium 6.5, bicarbonate 25, creatinine  17.4, BUN 96, temperature 99.4, blood pressure 204/104, heart rate 78, RR 16, chest x-ray with cardiomegaly, vascular congestion in the ear defined airspace disease bilaterally.  Patient is placed on progressive bed for observation, Dr. Juleen China for nephrology is consulted for dialysis."    Patient underwent emergent dialysis and clinically improved.   Hyperkalemia was treated in ED with D50, insulin and 1 g calcium gluconate.  Also given Lokelma. Hyperkalemia resolved with above and dialysis.   Patient clinically improved and stable for discharge.  Advised not to miss any dialysis sessions.     Discharge Instructions   Discharge Instructions    (HEART FAILURE PATIENTS) Call MD:  Anytime you have any of the following symptoms: 1) 3 pound weight gain in 24 hours or 5 pounds in 1 week 2) shortness of breath, with or without a dry hacking cough 3) swelling in the hands, feet or stomach 4) if you have to sleep on extra pillows at night in order to breathe.   Complete by: As directed    Call MD for:  extreme fatigue   Complete by: As directed    Call MD for:  persistant dizziness or light-headedness   Complete by: As directed    Call MD for:  persistant nausea and vomiting   Complete by: As directed    Call MD for:  severe uncontrolled pain   Complete by: As directed    Call MD for:  temperature >100.4   Complete by: As directed    Increase  activity slowly   Complete by: As directed      Allergies as of 07/25/2020      Reactions   Ciprofloxacin Other (See Comments)   Penicillins Anaphylaxis   Was told it could kill him, pt unsure of reaction  Has patient had a PCN reaction causing immediate rash, facial/tongue/throat swelling, SOB or lightheadedness with hypotension: No Has patient had a PCN reaction causing severe rash involving mucus membranes or skin necrosis: No Has patient had a PCN reaction that required hospitalization: No Has patient had a PCN reaction occurring within the  last 10 years: No If all of the above answers are "NO", then may proceed with Cephalosporin use.   Cyclopentolate Other (See Comments)      Medication List    TAKE these medications   albuterol 108 (90 Base) MCG/ACT inhaler Commonly known as: VENTOLIN HFA Inhale 2 puffs into the lungs every 6 (six) hours as needed for wheezing or shortness of breath.   amLODipine 10 MG tablet Commonly known as: NORVASC Take 1 tablet (10 mg total) by mouth daily.   aspirin EC 81 MG tablet Take 81 mg daily by mouth.   calcium carbonate 750 MG chewable tablet Commonly known as: TUMS EX Chew 2 tablets by mouth at bedtime.   lanthanum 1000 MG chewable tablet Commonly known as: FOSRENOL Chew 3,000 mg by mouth 3 (three) times daily.   Levemir FlexTouch 100 UNIT/ML FlexPen Generic drug: insulin detemir Inject 10 Units into the skin at bedtime.   lisinopril 40 MG tablet Commonly known as: ZESTRIL Take 1 tablet (40 mg total) by mouth daily.   loperamide 2 MG capsule Commonly known as: IMODIUM Take 1 capsule (2 mg total) by mouth as needed for diarrhea or loose stools.   metoprolol succinate 50 MG 24 hr tablet Commonly known as: TOPROL-XL Take 1 tablet (50 mg total) by mouth daily. Take with or immediately following a meal.   pregabalin 75 MG capsule Commonly known as: LYRICA Take 75 mg by mouth 2 (two) times daily.   rOPINIRole 2 MG tablet Commonly known as: REQUIP Take 2 mg by mouth at bedtime.   Symbicort 80-4.5 MCG/ACT inhaler Generic drug: budesonide-formoterol Inhale 2 puffs into the lungs 2 (two) times daily.   Vitamin D 50 MCG (2000 UT) tablet Take 2,000 Units by mouth daily.       Allergies  Allergen Reactions  . Ciprofloxacin Other (See Comments)  . Penicillins Anaphylaxis    Was told it could kill him, pt unsure of reaction  Has patient had a PCN reaction causing immediate rash, facial/tongue/throat swelling, SOB or lightheadedness with hypotension: No Has patient  had a PCN reaction causing severe rash involving mucus membranes or skin necrosis: No Has patient had a PCN reaction that required hospitalization: No Has patient had a PCN reaction occurring within the last 10 years: No If all of the above answers are "NO", then may proceed with Cephalosporin use.   . Cyclopentolate Other (See Comments)    Consultations:  Nephrology    Procedures/Studies: DG Chest 2 View  Result Date: 07/24/2020 CLINICAL DATA:  Cough and shortness of breath EXAM: CHEST - 2 VIEW COMPARISON:  August 12, 2019 FINDINGS: There is patchy ill-defined airspace opacity bilaterally. No consolidation. Heart is mildly enlarged with a degree of pulmonary venous hypertension. No adenopathy. No bone lesions. IMPRESSION: Cardiomegaly with a degree of pulmonary vascular congestion. Ill-defined areas of airspace opacity raise concern for atypical organism pneumonia. Check of COVID-19 status in  this regard advised. Note that the pulmonary edema could present in this manner. Both edema and pneumonia may present concurrently. Electronically Signed   By: Lowella Grip III M.D.   On: 07/24/2020 10:44      Dialysis   Subjective: Pt feels well, much better.  Feels back to normal.  No other complaints.    Discharge Exam: Vitals:   07/25/20 1130 07/25/20 1145  BP: (!) 154/86 (!) 148/83  Pulse: 79 79  Resp: 20 (!) 22  Temp:    SpO2: 95% 98%   Vitals:   07/25/20 1100 07/25/20 1115 07/25/20 1130 07/25/20 1145  BP: (!) 168/88 (!) 142/79 (!) 154/86 (!) 148/83  Pulse: 75 78 79 79  Resp: 20 18 20  (!) 22  Temp:      TempSrc:      SpO2: 98% 97% 95% 98%  Weight:      Height:        General: Pt is alert, awake, not in acute distress Cardiovascular: RRR, S1/S2 +, no rubs, no gallops Respiratory: CTA bilaterally, no wheezing, no rhonchi Abdominal: Soft, NT, ND, bowel sounds + Extremities: no edema, no cyanosis    The results of significant diagnostics from this hospitalization  (including imaging, microbiology, ancillary and laboratory) are listed below for reference.     Microbiology: Recent Results (from the past 240 hour(s))  Blood culture (routine x 2)     Status: None (Preliminary result)   Collection Time: 07/24/20  2:16 PM   Specimen: BLOOD  Result Value Ref Range Status   Specimen Description BLOOD RIGHT ANTECUBITAL  Final   Special Requests   Final    BOTTLES DRAWN AEROBIC AND ANAEROBIC Blood Culture results may not be optimal due to an excessive volume of blood received in culture bottles   Culture   Final    NO GROWTH < 24 HOURS Performed at [facility], [address] [place], [place] [address]    Report Status PENDING  Incomplete  Blood culture (routine x 2)     Status: None (Preliminary result)   Collection Time: 07/24/20  2:16 PM   Specimen: BLOOD  Result Value Ref Range Status   Specimen Description BLOOD BLOOD RIGHT HAND  Final   Special Requests   Final    BOTTLES DRAWN AEROBIC AND ANAEROBIC Blood Culture adequate volume   Culture   Final    NO GROWTH < 24 HOURS Performed at [facility], [address], [place], [place] [address]    Report Status PENDING  Incomplete  Resp Panel by RT-PCR (Flu A&B, Covid) Nasopharyngeal Swab     Status: None   Collection Time: 07/24/20  2:16 PM   Specimen: Nasopharyngeal Swab; Nasopharyngeal(NP) swabs in vial transport medium  Result Value Ref Range Status   SARS Coronavirus 2 by RT PCR NEGATIVE NEGATIVE Final    Comment: (NOTE) SARS-CoV-2 target nucleic acids are NOT DETECTED.  The SARS-CoV-2 RNA is generally detectable in upper respiratory specimens during the acute phase of infection. The lowest concentration of SARS-CoV-2 viral copies this assay can detect is 138 copies/mL. A negative result does not preclude SARS-Cov-2 infection and should not be used as the sole basis for treatment or other patient management decisions. A negative result may occur with  improper  specimen collection/handling, submission of specimen other than nasopharyngeal swab, presence of viral mutation(s) within the areas targeted by this assay, and inadequate number of viral copies(<138 copies/mL). A negative result must be combined with clinical observations,  patient history, and epidemiological information. The expected result is Negative.  Fact Sheet for Patients:  EntrepreneurPulse.com.au  Fact Sheet for Healthcare Providers:  IncredibleEmployment.be  This test is no t yet approved or cleared by the [place] FDA and  has been authorized for detection and/or diagnosis of SARS-CoV-2 by FDA under an Emergency Use Authorization (EUA). This EUA will remain  in effect (meaning this test can be used) for the duration of the COVID-19 declaration under Section 564(b)(1) of the Act, 21 U.S.C.section 360bbb-3(b)(1), unless the authorization is terminated  or revoked sooner.       Influenza A by PCR NEGATIVE NEGATIVE Final   Influenza B by PCR NEGATIVE NEGATIVE Final    Comment: (NOTE) The Xpert Xpress SARS-CoV-2/FLU/RSV plus assay is intended as an aid in the diagnosis of influenza from Nasopharyngeal swab specimens and should not be used as a sole basis for treatment. Nasal washings and aspirates are unacceptable for Xpert Xpress SARS-CoV-2/FLU/RSV testing.  Fact Sheet for Patients: EntrepreneurPulse.com.au  Fact Sheet for Healthcare Providers: IncredibleEmployment.be  This test is not yet approved or cleared by the [place] FDA and has been authorized for detection and/or diagnosis of SARS-CoV-2 by FDA under an Emergency Use Authorization (EUA). This EUA will remain in effect (meaning this test can be used) for the duration of the COVID-19 declaration under Section 564(b)(1) of the Act, 21 U.S.C. section 360bbb-3(b)(1), unless the authorization is terminated or revoked.  Performed at  [facility], [address] [place], [place] [address]      Labs: BNP (last 3 results) No results for input(s): BNP in the last 8760 hours. Basic Metabolic Panel: Recent Labs  Lab 07/24/20 1008 07/25/20 0326  NA 136 140  K 6.5* 4.9  CL 93* 98  CO2 25 28  GLUCOSE 161* 72  BUN 96* 59*  CREATININE 17.40* 12.80*  CALCIUM 8.7* 8.3*   Liver Function Tests: No results for input(s): AST, ALT, ALKPHOS, BILITOT, PROT, ALBUMIN in the last 168 hours. No results for input(s): LIPASE, AMYLASE in the last 168 hours. No results for input(s): AMMONIA in the last 168 hours. CBC: Recent Labs  Lab 07/24/20 1008 07/25/20 0326  WBC 9.5 5.6  HGB 10.3* 9.0*  HCT 31.3* 27.3*  MCV 96.3 96.1  PLT 152 142*   Cardiac Enzymes: No results for input(s): CKTOTAL, CKMB, CKMBINDEX, TROPONINI in the last 168 hours. BNP: Invalid input(s): POCBNP CBG: Recent Labs  Lab 07/24/20 2118 07/24/20 2318 07/25/20 0323 07/25/20 0327 07/25/20 0741  GLUCAP 127* 229* 59* 77 81   D-Dimer No results for input(s): DDIMER in the last 72 hours. Hgb A1c No results for input(s): HGBA1C in the last 72 hours. Lipid Profile No results for input(s): CHOL, HDL, LDLCALC, TRIG, CHOLHDL, LDLDIRECT in the last 72 hours. Thyroid function studies No results for input(s): TSH, T4TOTAL, T3FREE, THYROIDAB in the last 72 hours.  Invalid input(s): FREET3 Anemia work up No results for input(s): VITAMINB12, FOLATE, FERRITIN, TIBC, IRON, RETICCTPCT in the last 72 hours. Urinalysis    Component Value Date/Time   COLORURINE Straw 11/19/2013 1458   APPEARANCEUR Clear 11/19/2013 1458   LABSPEC 1.008 11/19/2013 1458   PHURINE 6.0 11/19/2013 1458   GLUCOSEU 150 mg/dL 11/19/2013 1458   HGBUR 2+ 11/19/2013 1458   BILIRUBINUR Negative 11/19/2013 1458   KETONESUR Negative 11/19/2013 1458   PROTEINUR 30 mg/dL 11/19/2013 1458   NITRITE Negative 11/19/2013 1458   LEUKOCYTESUR Negative 11/19/2013 1458   Sepsis  Labs Invalid input(s): PROCALCITONIN,  WBC,  LACTICIDVEN Microbiology Recent Results (from the past 240 hour(s))  Blood culture (routine x 2)     Status: None (Preliminary result)   Collection Time: 07/24/20  2:16 PM   Specimen: BLOOD  Result Value Ref Range Status   Specimen Description BLOOD RIGHT ANTECUBITAL  Final   Special Requests   Final    BOTTLES DRAWN AEROBIC AND ANAEROBIC Blood Culture results may not be optimal due to an excessive volume of blood received in culture bottles   Culture   Final    NO GROWTH < 24 HOURS Performed at [facility], [address], [place], [place] [address]    Report Status PENDING  Incomplete  Blood culture (routine x 2)     Status: None (Preliminary result)   Collection Time: 07/24/20  2:16 PM   Specimen: BLOOD  Result Value Ref Range Status   Specimen Description BLOOD BLOOD RIGHT HAND  Final   Special Requests   Final    BOTTLES DRAWN AEROBIC AND ANAEROBIC Blood Culture adequate volume   Culture   Final    NO GROWTH < 24 HOURS Performed at [facility], [address] [place], [place] [address]    Report Status PENDING  Incomplete  Resp Panel by RT-PCR (Flu A&B, Covid) Nasopharyngeal Swab     Status: None   Collection Time: 07/24/20  2:16 PM   Specimen: Nasopharyngeal Swab; Nasopharyngeal(NP) swabs in vial transport medium  Result Value Ref Range Status   SARS Coronavirus 2 by RT PCR NEGATIVE NEGATIVE Final    Comment: (NOTE) SARS-CoV-2 target nucleic acids are NOT DETECTED.  The SARS-CoV-2 RNA is generally detectable in upper respiratory specimens during the acute phase of infection. The lowest concentration of SARS-CoV-2 viral copies this assay can detect is 138 copies/mL. A negative result does not preclude SARS-Cov-2 infection and should not be used as the sole basis for treatment or other patient management decisions. A negative result may occur with  improper specimen collection/handling, submission of  specimen other than nasopharyngeal swab, presence of viral mutation(s) within the areas targeted by this assay, and inadequate number of viral copies(<138 copies/mL). A negative result must be combined with clinical observations, patient history, and epidemiological information. The expected result is Negative.  Fact Sheet for Patients:  EntrepreneurPulse.com.au  Fact Sheet for Healthcare Providers:  IncredibleEmployment.be  This test is no t yet approved or cleared by the [place] FDA and  has been authorized for detection and/or diagnosis of SARS-CoV-2 by FDA under an Emergency Use Authorization (EUA). This EUA will remain  in effect (meaning this test can be used) for the duration of the COVID-19 declaration under Section 564(b)(1) of the Act, 21 U.S.C.section 360bbb-3(b)(1), unless the authorization is terminated  or revoked sooner.       Influenza A by PCR NEGATIVE NEGATIVE Final   Influenza B by PCR NEGATIVE NEGATIVE Final    Comment: (NOTE) The Xpert Xpress SARS-CoV-2/FLU/RSV plus assay is intended as an aid in the diagnosis of influenza from Nasopharyngeal swab specimens and should not be used as a sole basis for treatment. Nasal washings and aspirates are unacceptable for Xpert Xpress SARS-CoV-2/FLU/RSV testing.  Fact Sheet for Patients: EntrepreneurPulse.com.au  Fact Sheet for Healthcare Providers: IncredibleEmployment.be  This test is not yet approved or cleared by the [place] FDA and has been authorized for detection and/or diagnosis of SARS-CoV-2 by FDA under an Emergency Use Authorization (EUA). This EUA will remain in effect (meaning this test can be  used) for the duration of the COVID-19 declaration under Section 564(b)(1) of the Act, 21 U.S.C. section 360bbb-3(b)(1), unless the authorization is terminated or revoked.  Performed at [facility], [address] [place], [place] [address]      Time coordinating discharge: Over 30 minutes  SIGNED:   Ezekiel Slocumb, DO [facility] 07/25/2020, 2:03 PM   If 7PM-7AM, please contact night-coverage [email or web address]

## 2020-07-25 NOTE — ED Notes (Signed)
Pt given meal tray.

## 2020-07-25 NOTE — Progress Notes (Addendum)
Inpatient Diabetes Program Recommendations  AACE/ADA: New Consensus Statement on Inpatient Glycemic Control   Target Ranges:  Prepandial:   less than 140 mg/dL      Peak postprandial:   less than 180 mg/dL (1-2 hours)      Critically ill patients:  140 - 180 mg/dL   Results for EMBER, GOTTWALD (MRN [id number]) as of 07/25/2020 09:56  Ref. Range 07/24/2020 10:11 07/24/2020 21:18 07/24/2020 23:18 07/25/2020 03:23 07/25/2020 03:27 07/25/2020 07:41  Glucose-Capillary Latest Ref Range: 70 - 99 mg/dL 150 (H) 127 (H) 229 (H) 59 (L) 77 81   Review of Glycemic Control  Diabetes history: DM2 Outpatient Diabetes medications: Levemir 10 units QHS Current orders for Inpatient glycemic control: Levemir 7 units QHS, Novolog 0-9 units Q4H  Inpatient Diabetes Program Recommendations:    Insulin: No Levemir given last night and glucose down to 59 mg/dl at 3:23 today. Please discontinue Levemir for now and just use Novolog correction scale when needed.  Thanks, Barnie Alderman, RN, MSN, CDE Diabetes Coordinator Inpatient Diabetes Program [phone] (Team Pager from 8am to 5pm)

## 2020-07-25 NOTE — Progress Notes (Signed)
[facility]  ROUNDING NOTE   Subjective:   [name] was admitted to [facility] on 07/24/2020 for Fluid overload [E87.70]  Receiving dialysis treatment today   HEMODIALYSIS FLOWSHEET:  Blood Flow Rate (mL/min): 400 mL/min Arterial Pressure (mmHg): -60 mmHg Venous Pressure (mmHg): 250 mmHg Transmembrane Pressure (mmHg): 100 mmHg Ultrafiltration Rate (mL/min): 1250 mL/min Dialysate Flow Rate (mL/min): 800 ml/min Conductivity: Machine : 13.9 Conductivity: Machine : 13.9 Dialysis Fluid Bolus: Normal Saline Bolus Amount (mL): 200 mL   Objective:  Vital signs in last 24 hours:  Temp:  [97.3 F (36.3 C)-98.6 F (37 C)] 98.1 F (36.7 C) (12/16 0745) Pulse Rate:  [69-86] 79 (12/16 1145) Resp:  [17-23] 22 (12/16 1145) BP: (121-215)/(58-174) 148/83 (12/16 1145) SpO2:  [90 %-100 %] 98 % (12/16 1145)  Weight change:  Filed Weights   07/24/20 1005  Weight: 111.6 kg    Intake/Output: I/O last 3 completed shifts: In: -  Out: 3200 [Other:3200]   Intake/Output this shift:  No intake/output data recorded.  Physical Exam: General:  Resting in bed, receiving dialysis treatment  Head:  Moist oral mucosal membranes  Eyes:  Sclerae and conjunctivae clear  Lungs:   Respirations symmetrical, unlabored, lungs clear  Heart:  S1-S2, no rubs or gallops  Abdomen:  Soft, nontender, obese  Extremities:  Trace peripheral edema.  Neurologic:  Awake, alert, oriented  Skin: No acute lesions or rashes  Access: Left AVF +bruit +thrill    Basic Metabolic Panel: Recent Labs  Lab 07/24/20 1008 07/25/20 0326  NA 136 140  K 6.5* 4.9  CL 93* 98  CO2 25 28  GLUCOSE 161* 72  BUN 96* 59*  CREATININE 17.40* 12.80*  CALCIUM 8.7* 8.3*    Liver Function Tests: No results for input(s): AST, ALT, ALKPHOS, BILITOT, PROT, ALBUMIN in the last 168 hours. No results for input(s): LIPASE, AMYLASE in the last 168 hours. No results for input(s): AMMONIA in the last 168  hours.  CBC: Recent Labs  Lab 07/24/20 1008 07/25/20 0326  WBC 9.5 5.6  HGB 10.3* 9.0*  HCT 31.3* 27.3*  MCV 96.3 96.1  PLT 152 142*    Cardiac Enzymes: No results for input(s): CKTOTAL, CKMB, CKMBINDEX, TROPONINI in the last 168 hours.  BNP: Invalid input(s): POCBNP  CBG: Recent Labs  Lab 07/24/20 2118 07/24/20 2318 07/25/20 0323 07/25/20 0327 07/25/20 0741  GLUCAP 127* 229* 59* 77 33    Microbiology: Results for orders placed or performed during the hospital encounter of 07/24/20  Blood culture (routine x 2)     Status: None (Preliminary result)   Collection Time: 07/24/20  2:16 PM   Specimen: BLOOD  Result Value Ref Range Status   Specimen Description BLOOD RIGHT ANTECUBITAL  Final   Special Requests   Final    BOTTLES DRAWN AEROBIC AND ANAEROBIC Blood Culture results may not be optimal due to an excessive volume of blood received in culture bottles   Culture   Final    NO GROWTH < 24 HOURS Performed at [facility], [address] [place], [place] [address]    Report Status PENDING  Incomplete  Blood culture (routine x 2)     Status: None (Preliminary result)   Collection Time: 07/24/20  2:16 PM   Specimen: BLOOD  Result Value Ref Range Status   Specimen Description BLOOD BLOOD RIGHT HAND  Final   Special Requests   Final    BOTTLES DRAWN AEROBIC AND ANAEROBIC Blood Culture adequate volume  Culture   Final    NO GROWTH < 24 HOURS Performed at [facility], [address] [place], [place] [address]    Report Status PENDING  Incomplete  Resp Panel by RT-PCR (Flu A&B, Covid) Nasopharyngeal Swab     Status: None   Collection Time: 07/24/20  2:16 PM   Specimen: Nasopharyngeal Swab; Nasopharyngeal(NP) swabs in vial transport medium  Result Value Ref Range Status   SARS Coronavirus 2 by RT PCR NEGATIVE NEGATIVE Final    Comment: (NOTE) SARS-CoV-2 target nucleic acids are NOT DETECTED.  The SARS-CoV-2 RNA is generally detectable in  upper respiratory specimens during the acute phase of infection. The lowest concentration of SARS-CoV-2 viral copies this assay can detect is 138 copies/mL. A negative result does not preclude SARS-Cov-2 infection and should not be used as the sole basis for treatment or other patient management decisions. A negative result may occur with  improper specimen collection/handling, submission of specimen other than nasopharyngeal swab, presence of viral mutation(s) within the areas targeted by this assay, and inadequate number of viral copies(<138 copies/mL). A negative result must be combined with clinical observations, patient history, and epidemiological information. The expected result is Negative.  Fact Sheet for Patients:  EntrepreneurPulse.com.au  Fact Sheet for Healthcare Providers:  IncredibleEmployment.be  This test is no t yet approved or cleared by the [place] FDA and  has been authorized for detection and/or diagnosis of SARS-CoV-2 by FDA under an Emergency Use Authorization (EUA). This EUA will remain  in effect (meaning this test can be used) for the duration of the COVID-19 declaration under Section 564(b)(1) of the Act, 21 U.S.C.section 360bbb-3(b)(1), unless the authorization is terminated  or revoked sooner.       Influenza A by PCR NEGATIVE NEGATIVE Final   Influenza B by PCR NEGATIVE NEGATIVE Final    Comment: (NOTE) The Xpert Xpress SARS-CoV-2/FLU/RSV plus assay is intended as an aid in the diagnosis of influenza from Nasopharyngeal swab specimens and should not be used as a sole basis for treatment. Nasal washings and aspirates are unacceptable for Xpert Xpress SARS-CoV-2/FLU/RSV testing.  Fact Sheet for Patients: EntrepreneurPulse.com.au  Fact Sheet for Healthcare Providers: IncredibleEmployment.be  This test is not yet approved or cleared by the [place] FDA and has been  authorized for detection and/or diagnosis of SARS-CoV-2 by FDA under an Emergency Use Authorization (EUA). This EUA will remain in effect (meaning this test can be used) for the duration of the COVID-19 declaration under Section 564(b)(1) of the Act, 21 U.S.C. section 360bbb-3(b)(1), unless the authorization is terminated or revoked.  Performed at [facility], [address] [place],  [address]     Coagulation Studies: No results for input(s): LABPROT, INR in the last 72 hours.  Urinalysis: No results for input(s): COLORURINE, LABSPEC, PHURINE, GLUCOSEU, HGBUR, BILIRUBINUR, KETONESUR, PROTEINUR, UROBILINOGEN, NITRITE, LEUKOCYTESUR in the last 72 hours.  Invalid input(s): APPERANCEUR    Imaging: DG Chest 2 View  Result Date: 07/24/2020 CLINICAL DATA:  Cough and shortness of breath EXAM: CHEST - 2 VIEW COMPARISON:  August 12, 2019 FINDINGS: There is patchy ill-defined airspace opacity bilaterally. No consolidation. Heart is mildly enlarged with a degree of pulmonary venous hypertension. No adenopathy. No bone lesions. IMPRESSION: Cardiomegaly with a degree of pulmonary vascular congestion. Ill-defined areas of airspace opacity raise concern for atypical organism pneumonia. Check of COVID-19 status in this regard advised. Note that the pulmonary edema could present in this manner. Both edema and pneumonia may present  concurrently. Electronically Signed   By: Lowella Grip III M.D.   On: 07/24/2020 10:44     Medications:   . sodium chloride     . amLODipine  10 mg Oral Daily  . aspirin EC  81 mg Oral Daily  . calcium carbonate  1,500 mg Oral QHS  . dextrose  50 mL Intravenous Once  . epoetin (EPOGEN/PROCRIT) injection  10,000 Units Intravenous Q T,Th,Sa-HD  . heparin  5,000 Units Subcutaneous Q8H  . hydrALAZINE  10 mg Intravenous Once  . insulin aspart  0-9 Units Subcutaneous Q4H  . insulin aspart  5 Units Intravenous Once  . lanthanum  3,000 mg Oral TID   . lisinopril  40 mg Oral Daily  . metoprolol succinate  50 mg Oral Daily  . mometasone-formoterol  2 puff Inhalation BID  . nitroGLYCERIN  1 inch Topical Once  . pregabalin  75 mg Oral BID  . rOPINIRole  2 mg Oral QHS  . sodium chloride flush  3 mL Intravenous Q12H  . sodium zirconium cyclosilicate  10 g Oral Once   sodium chloride, acetaminophen, albuterol, dextromethorphan-guaiFENesin, hydrALAZINE, hydrOXYzine, loperamide, sodium chloride flush  Assessment/ Plan:  [name] is a 64 y.o. black male with end stage renal disease on hemodialysis, hypertension, diabetes mellitus type II, GERD, coronary artery disease, who is admitted to [facility] on 07/24/2020 for Fluid overload [E87.70]  [facility] TTS [address] Left AVF 110.5kg.   #End Stage renal disease Patient received emergency dialysis treatment yesterday with ultrafiltration of 3.2L Receiving dialysis treatment again today as per his regular schedule We will continue TTS schedule while hospitalized  #Hypertensive urgency:  Blood pressure readings stay above goal, 175/94 this morning Currently on amlodipine, hydralazine, lisinopril, and metoprolol.   #Hyperkalemia Potassium normalized to 4.9 today  #Anemia of chronic kidney disease Lab Results  Component Value Date   HGB 9.0 (L) 07/25/2020  Continue Epogen 10,000 units TTS with HD  #Secondary Hyperparathyroidism Lab Results  Component Value Date   CALCIUM 8.3 (L) 07/25/2020   PHOS 7.4 (H) 07/22/2019  Will continue monitoring bone mineral metabolism parameters   LOS: 0 Almer Litt[place] 12/16/20212:08 PM

## 2020-07-25 NOTE — Progress Notes (Signed)
Restarted treatment at this time without any issues.

## 2020-07-25 NOTE — Progress Notes (Signed)
Patient requesting to go to the bathroom at this time. Rinse back completed and flushed at this time. RN walked the patient to the bathroom at this time and informed the patient she would be right back to get him and before the RN could return <46min the patient was attempting to walk back to the suite by himself and staff from [facility] assisted the patient back to the suite. Patient states, "I do this all the time at the clinic." RN reeducated the patient on the rules of the hospital versus the rules in the clinic. Patient states he was sorry at this time. RN informed the patient everything would be okay and that he would remember the next time if applicable. During bathroom break, the system and the arterial access site clotted. RN set up a new disposable line system and re cannulated the arterial line and reconnected the patient.

## 2020-07-26 LAB — HEPATITIS B SURFACE ANTIBODY, QUANTITATIVE: Hep B S AB Quant (Post): >1000 m[IU]/mL (ref >9.9)

## 2020-07-31 LAB — CULTURE, BLOOD (ROUTINE X 2)
Culture: NO GROWTH
Culture: NO GROWTH
Special Requests: ADEQUATE

## 2020-08-21 IMAGING — CR DG FOOT 2V*R*
1 series · 2 of 2 positions shown · non-contrast
Comparison: 03/31/2019

CLINICAL DATA: Wound of the right foot. Previous partial amputation
of the first ray.

EXAM:
RIGHT FOOT - 2 VIEW

[Series 1: dg foot 2 views right · 0.14mm/px · 2 of 2 slices shown]
[im 1/2]
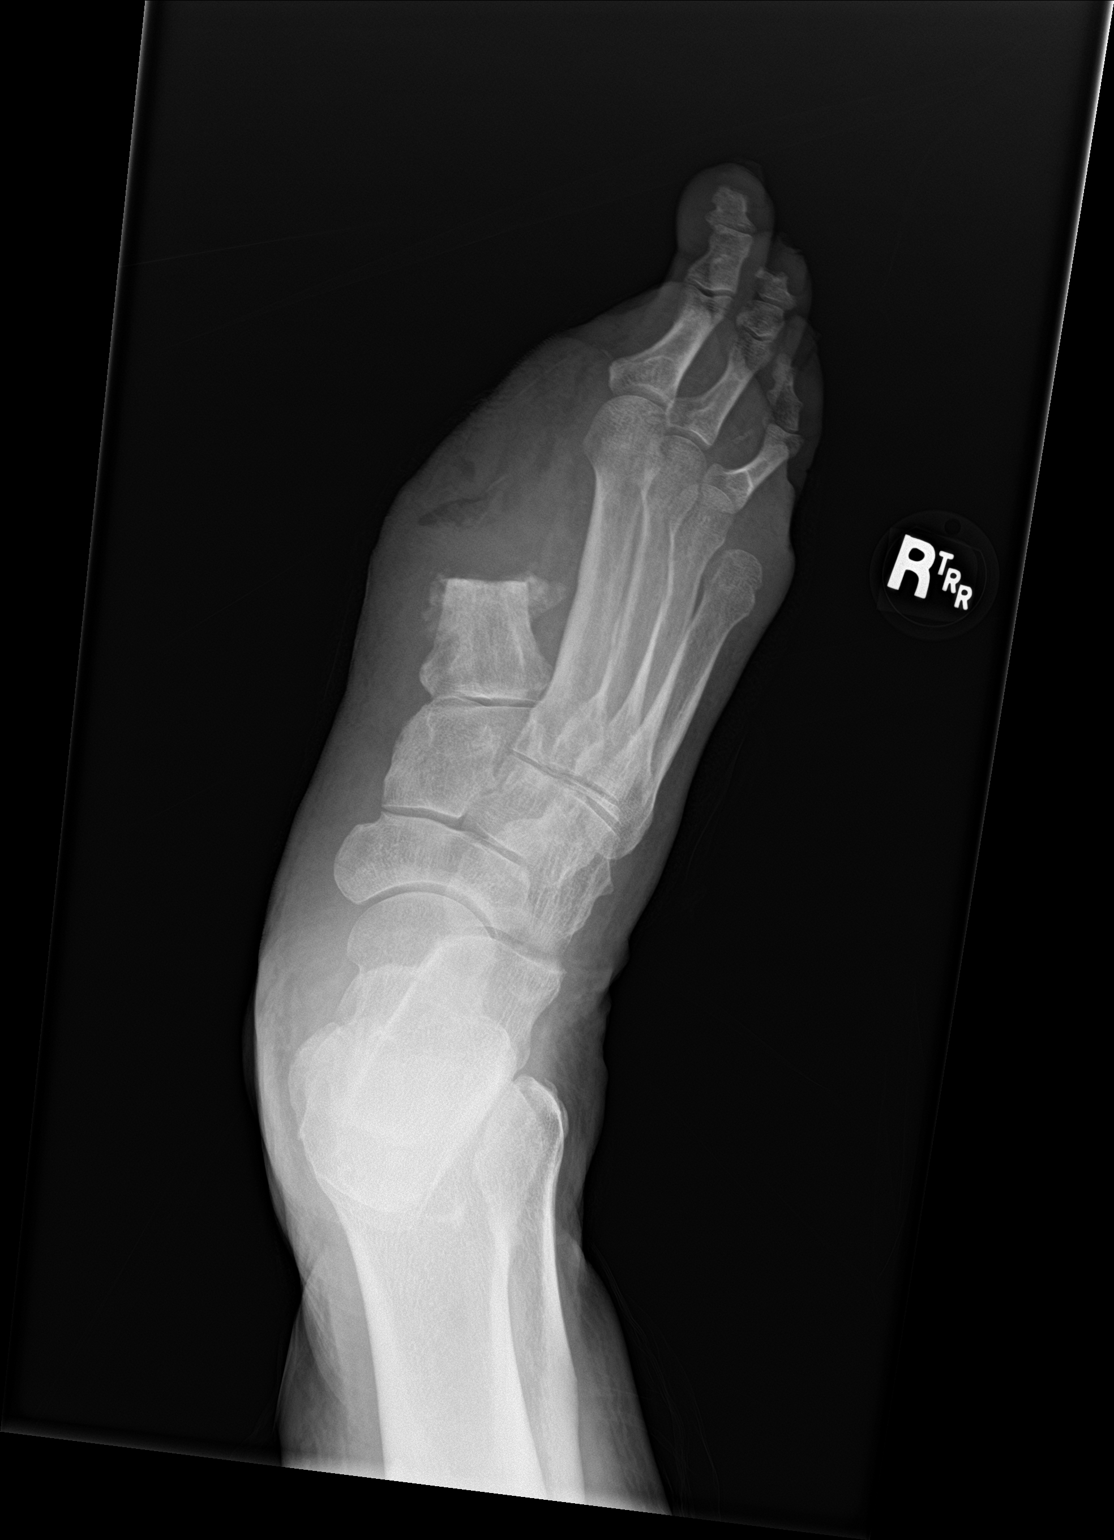
[im 2/2]
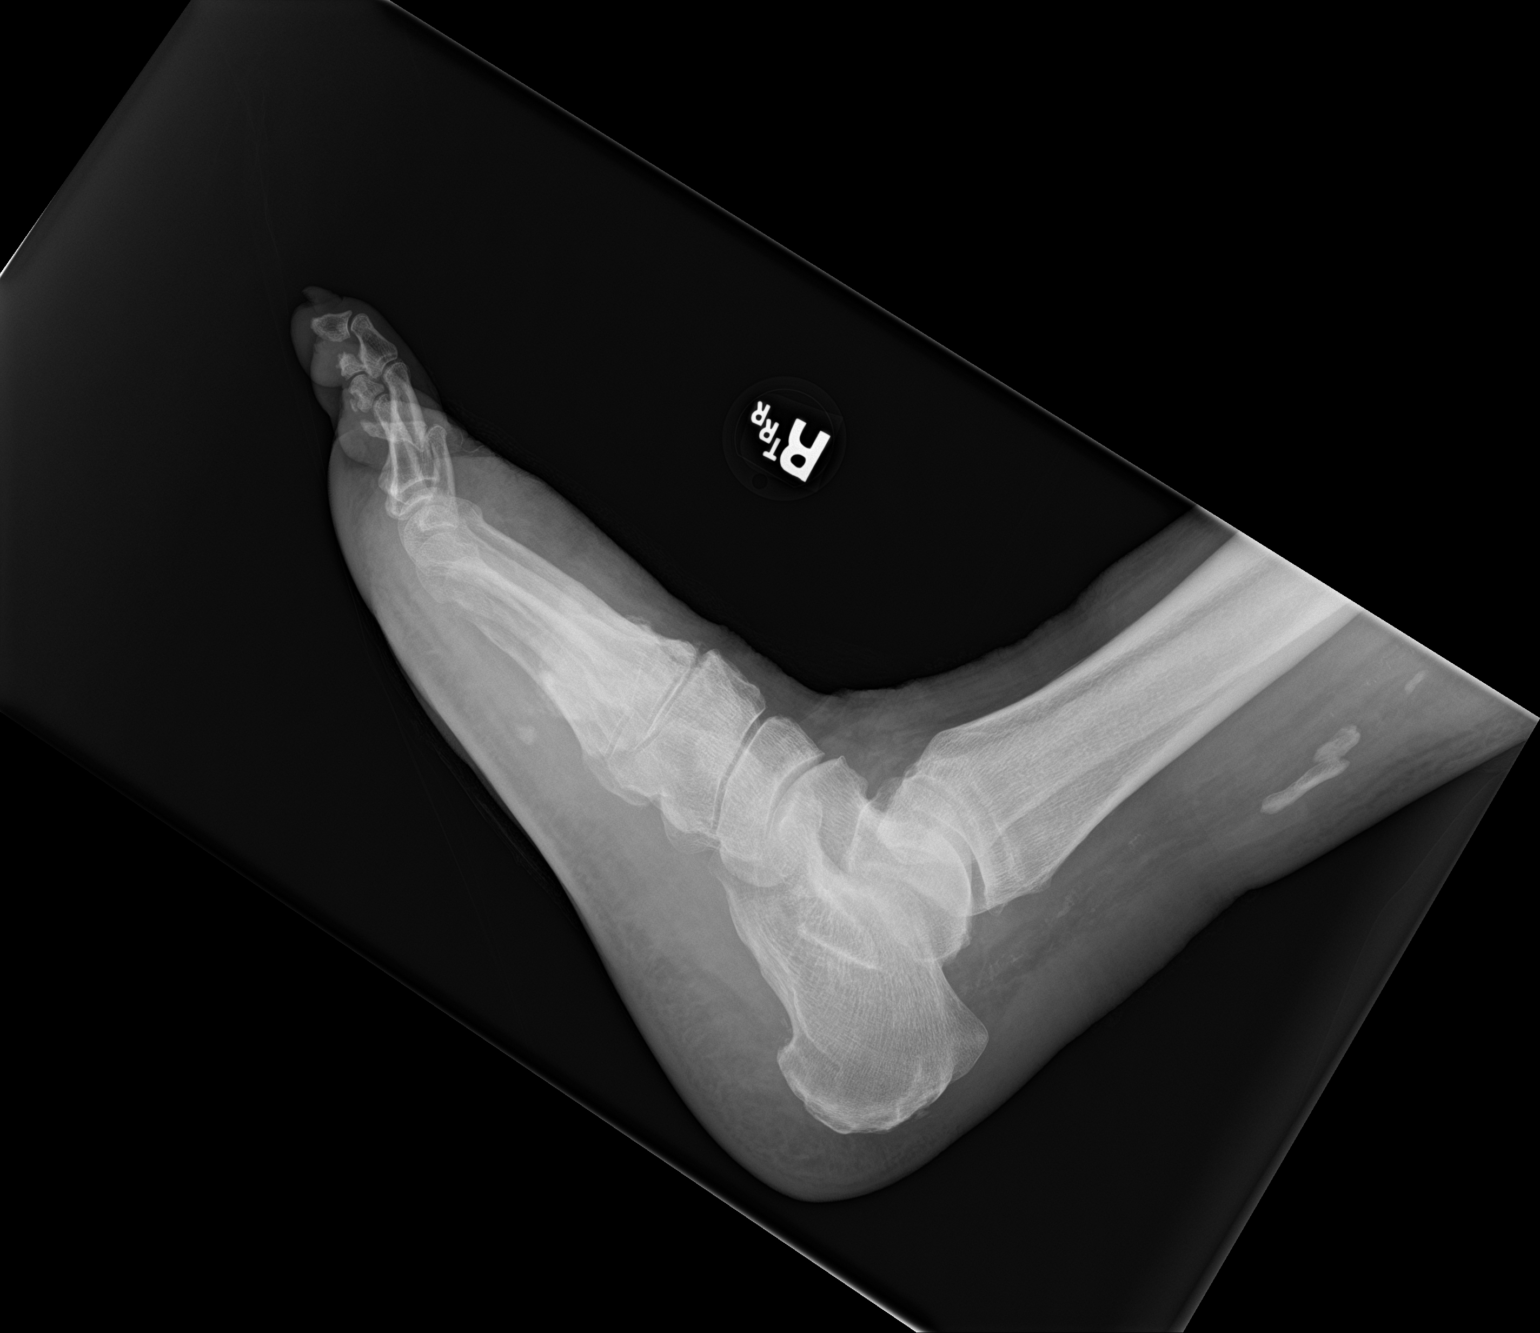

[2 of 2 positions shown; findings below may reference images not displayed]

FINDINGS: There is air in the soft tissues at the site of the wound. The
surgical margin of the first metatarsal is sharp. There is some
periosteal new bone at the stump of the first metatarsal which is to
the expected degree.

Previous amputation of the fifth toe. Slight dorsal spurring at the
tarsal metatarsal joints.

No other significant abnormalities.
IMPRESSION: No evidence of osteomyelitis.  Soft tissue wound.

## 2020-08-21 IMAGING — DX DG CHEST 1V PORT
2 series · 2 of 2 positions shown · non-contrast
Comparison: Single-view of the chest 05/22/2019 and 12/25/2013.

CLINICAL DATA: Acute respiratory failure and wheezing. The patient
missed his last dialysis.

EXAM:
PORTABLE CHEST 1 VIEW

[chest ap (1 of 2)]
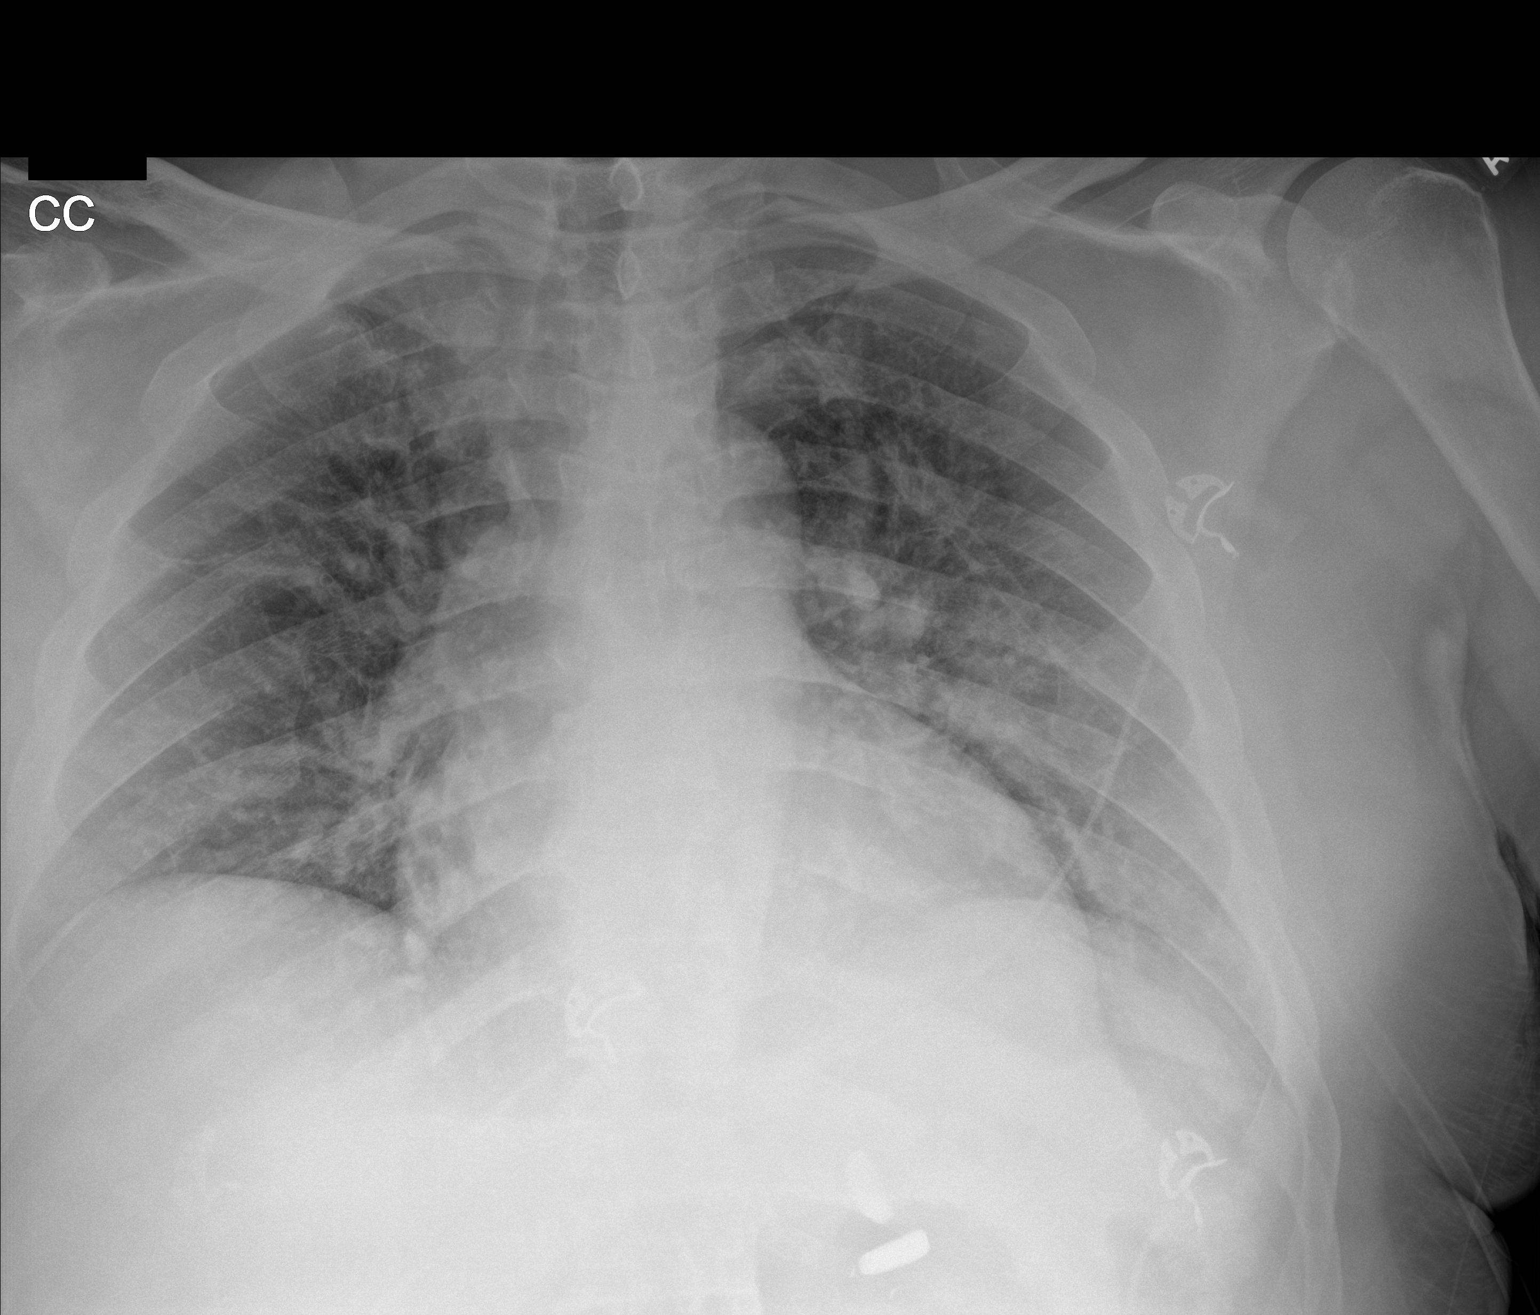

[chest ap (2 of 2)]
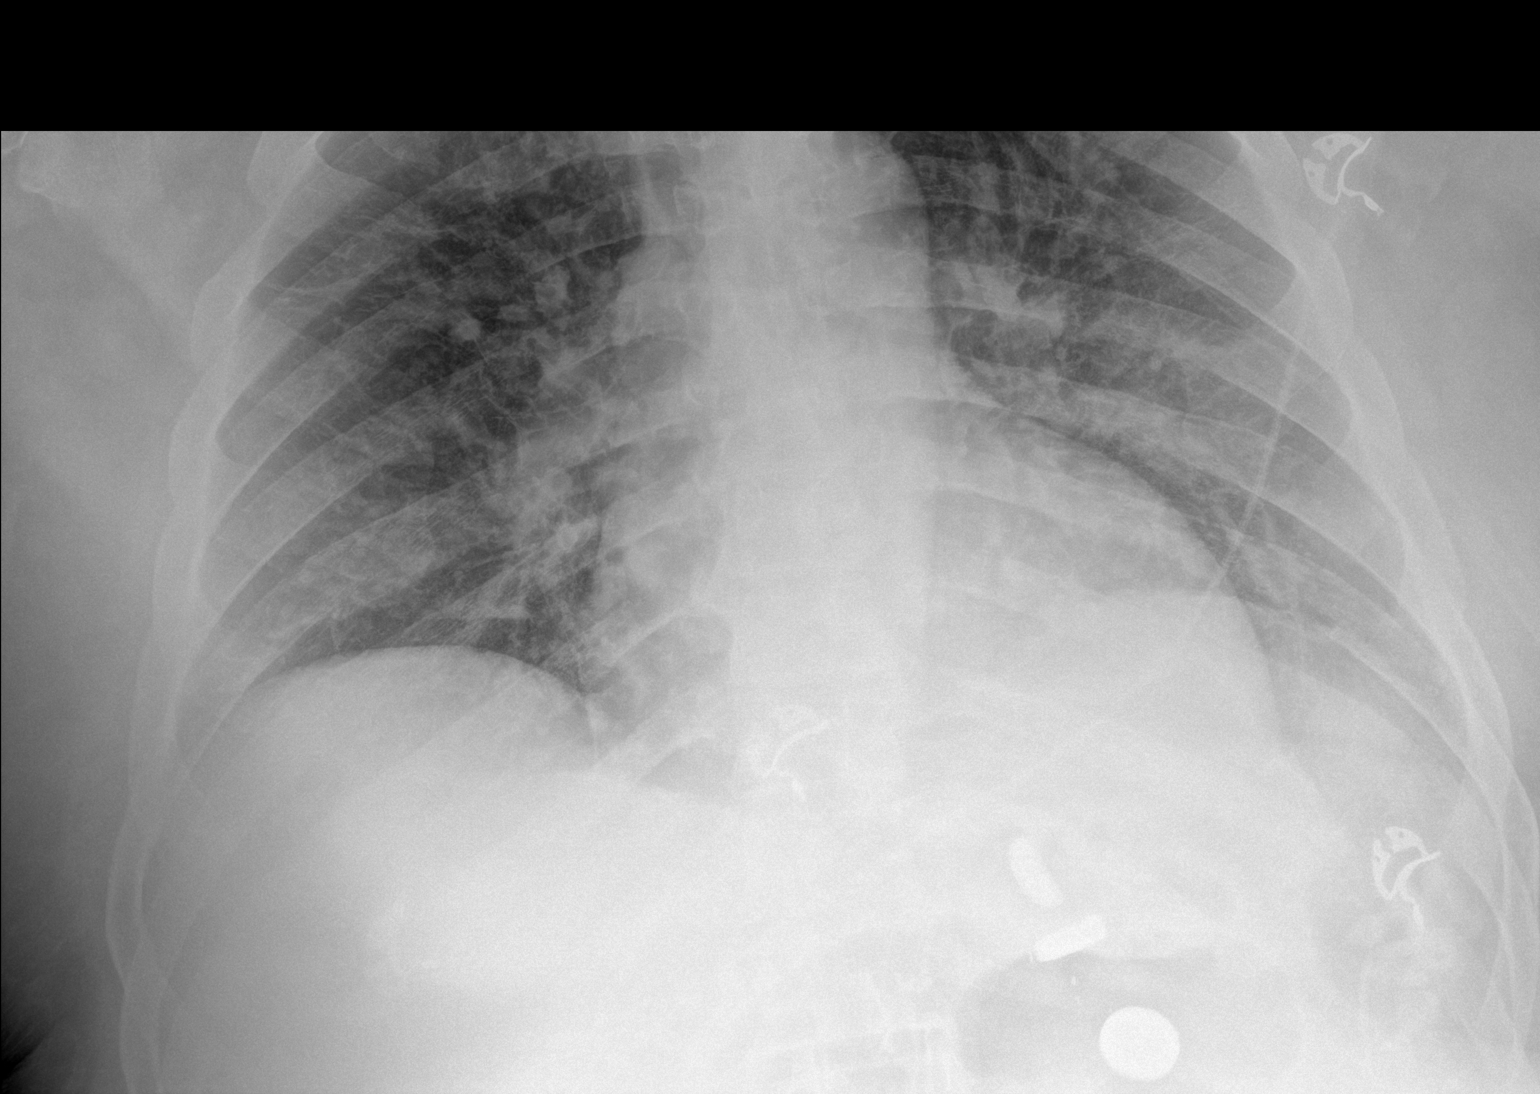

[2 of 2 positions shown; findings below may reference images not displayed]

FINDINGS: Hazy airspace opacity in left lower lung zone is unchanged. There is
mild interstitial edema. No pneumothorax or pleural effusion.
IMPRESSION: No change in mild, hazy airspace opacity in left lung base.

No change in mild interstitial edema.

## 2020-09-12 IMAGING — DX DG CHEST 1V PORT
1 series · 1 of 1 positions shown · non-contrast
Comparison: 06/13/2019

CLINICAL DATA: Pulmonary infiltrates, status post dialysis

EXAM:
PORTABLE CHEST 1 VIEW

[chest ap]
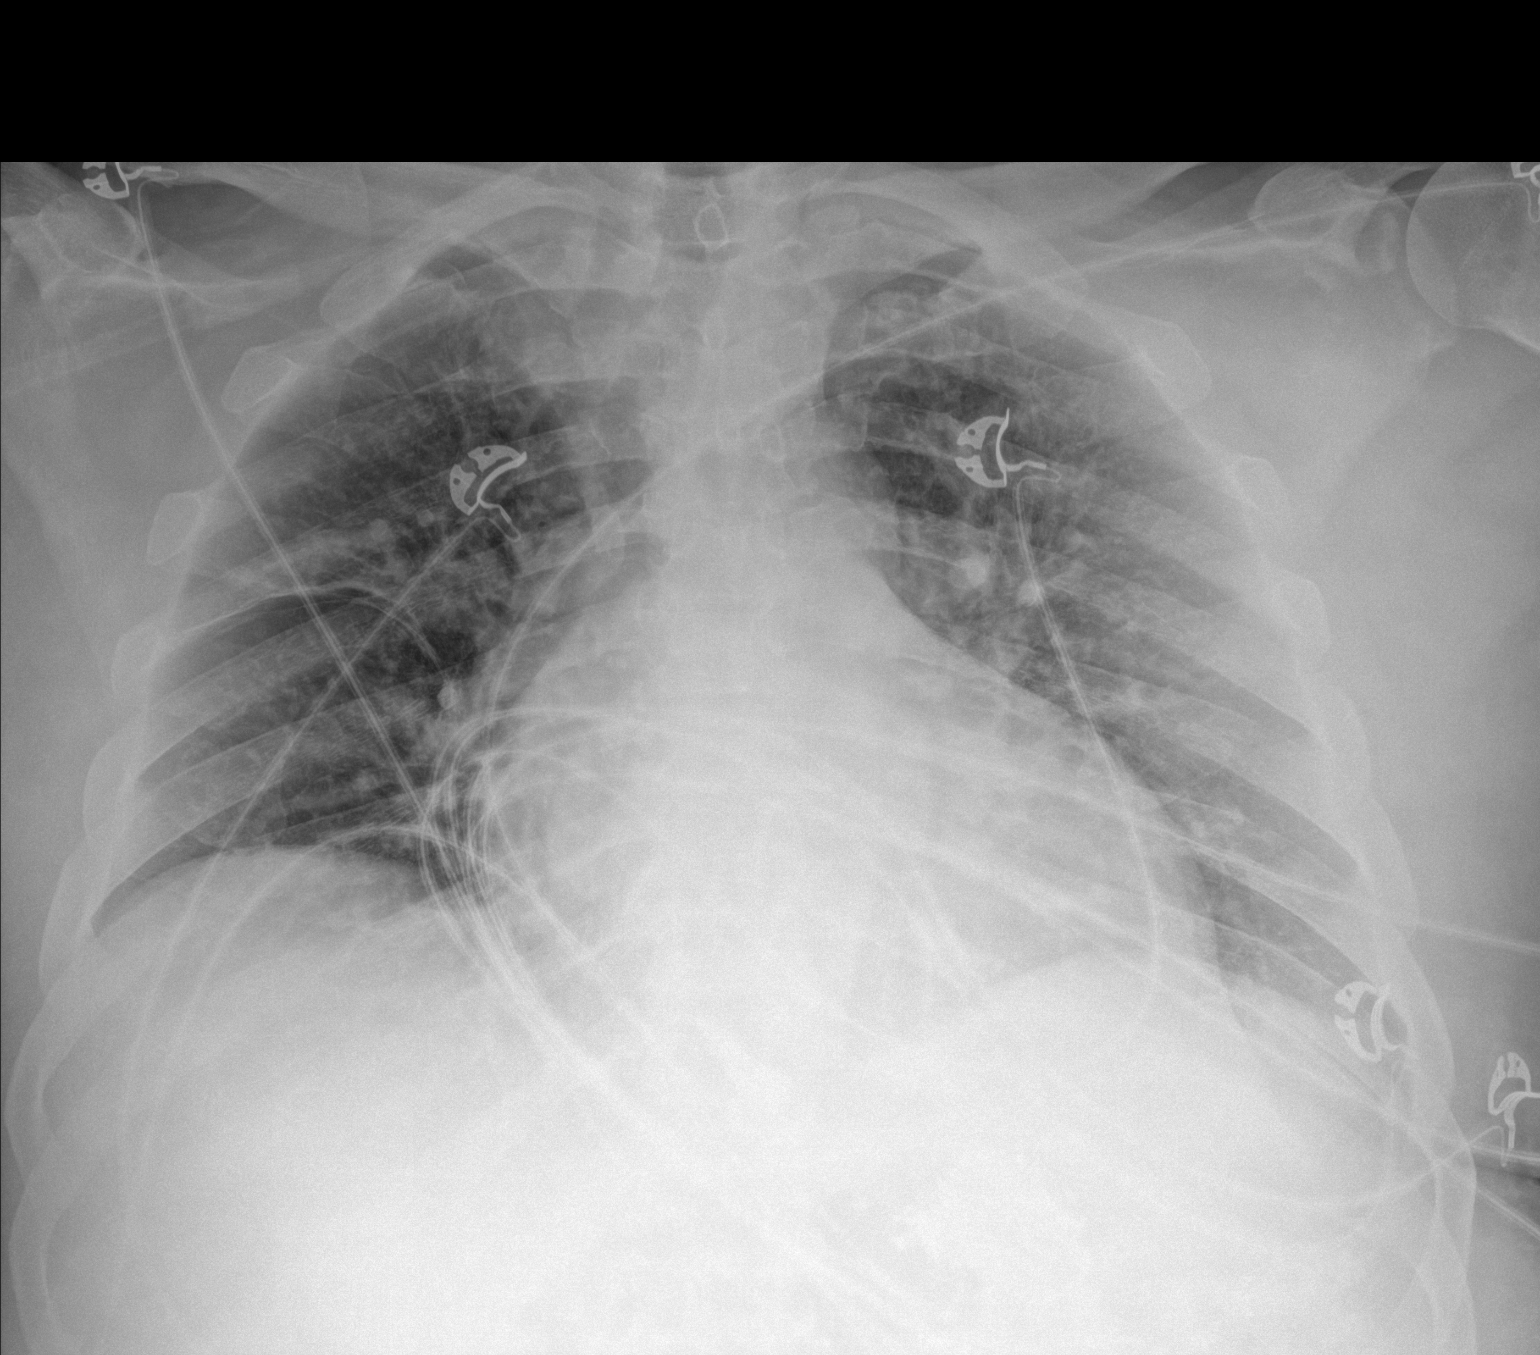

[1 of 1 positions shown; findings below may reference images not displayed]

FINDINGS: No significant interval change in diffuse bilateral interstitial
pulmonary opacity. No new or focal airspace opacity. Cardiomegaly.
IMPRESSION: 1. No significant interval change in diffuse bilateral interstitial
pulmonary opacity, consistent with edema. No new or focal airspace
opacity.

2.  Cardiomegaly.

## 2020-09-13 ENCOUNTER — Ambulatory Visit (INDEPENDENT_AMBULATORY_CARE_PROVIDER_SITE_OTHER): Payer: Medicare Other | Admitting: Podiatry

## 2020-09-13 ENCOUNTER — Other Ambulatory Visit: Payer: Self-pay

## 2020-09-13 ENCOUNTER — Encounter: Payer: Self-pay | Admitting: Podiatry

## 2020-09-13 DIAGNOSIS — M79674 Pain in right toe(s): Secondary | ICD-10-CM

## 2020-09-13 DIAGNOSIS — L989 Disorder of the skin and subcutaneous tissue, unspecified: Secondary | ICD-10-CM | POA: Diagnosis not present

## 2020-09-13 DIAGNOSIS — B351 Tinea unguium: Secondary | ICD-10-CM

## 2020-09-13 DIAGNOSIS — M79675 Pain in left toe(s): Secondary | ICD-10-CM

## 2020-09-13 DIAGNOSIS — E0843 Diabetes mellitus due to underlying condition with diabetic autonomic (poly)neuropathy: Secondary | ICD-10-CM

## 2020-09-13 NOTE — Progress Notes (Signed)
    Subjective: Patient is a 65 y.o. male presenting to the office today with a chief complaint of painful callus lesion(s) noted to the bilateral feet. Walking and bearing weight increases the pain. He has not done anything at home for treatment.  Patient also complains of elongated, thickened nails that cause pain while ambulating in shoes. He is unable to trim his own nails. Patient presents today for further treatment and evaluation.  Past Medical History:  Diagnosis Date  . CKD (chronic kidney disease) stage 5, GFR less than 15 ml/min (HCC)   . COVID-19   . Diabetes mellitus without complication ([place])   . Dyspnea   . GERD (gastroesophageal reflux disease)   . Hypertension   . Lower extremity edema   . Myocardial infarction [facility])     Objective:  Physical Exam General: Alert and oriented x3 in no acute distress  Dermatology: Hyperkeratotic lesion(s) present on the bilateral feet. Pain on palpation with a central nucleated core noted. Skin is warm, dry and supple bilateral lower extremities. Negative for open lesions or macerations. Nails are tender, long, thickened and dystrophic with subungual debris, consistent with onychomycosis, 1-5 left, 2-4 right. No signs of infection noted.  Vascular: Palpable pedal pulses bilaterally. No edema or erythema noted. Capillary refill within normal limits.  Neurological: Epicritic and protective threshold diminished bilaterally.   Musculoskeletal Exam: Pain on palpation at the keratotic lesion(s) noted. 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 Onychodystrophic nails 1-5 bilateral with hyperkeratosis of nails.  2. Onychomycosis of nail due to dermatophyte bilateral 3. Pre-ulcerative callus lesions noted to the bilateral feet x 4 4. 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 Patient evaluated. 2. Excisional debridement of keratoic lesion(s) using a chisel blade was performed without incident.  3. Dressed with  light dressing. 4. Mechanical debridement of nails 1-5 bilaterally performed using a nail nipper. Filed with dremel without incident.  5.  Continue diabetic shoes 6. Patient is to return to the clinic in 3 months.   Edrick Kins, DPM [facility]  Dr. Edrick Kins, [address]                                        [place], [place] [address]                Office [phone]  Fax [phone]

## 2020-11-26 ENCOUNTER — Other Ambulatory Visit (INDEPENDENT_AMBULATORY_CARE_PROVIDER_SITE_OTHER): Payer: Self-pay | Admitting: Nurse Practitioner

## 2020-11-26 DIAGNOSIS — K551 Chronic vascular disorders of intestine: Secondary | ICD-10-CM

## 2020-11-27 ENCOUNTER — Ambulatory Visit (INDEPENDENT_AMBULATORY_CARE_PROVIDER_SITE_OTHER): Payer: Medicare Other | Admitting: Nurse Practitioner

## 2020-11-27 ENCOUNTER — Other Ambulatory Visit: Payer: Self-pay

## 2020-11-27 ENCOUNTER — Ambulatory Visit (INDEPENDENT_AMBULATORY_CARE_PROVIDER_SITE_OTHER): Payer: Medicare Other

## 2020-11-27 ENCOUNTER — Encounter (INDEPENDENT_AMBULATORY_CARE_PROVIDER_SITE_OTHER): Payer: Self-pay | Admitting: Nurse Practitioner

## 2020-11-27 VITALS — BP 144/74 | HR 80 | Ht 71.0 in | Wt 236.0 lb

## 2020-11-27 DIAGNOSIS — I10 Essential (primary) hypertension: Secondary | ICD-10-CM

## 2020-11-27 DIAGNOSIS — K551 Chronic vascular disorders of intestine: Secondary | ICD-10-CM | POA: Diagnosis not present

## 2020-11-27 DIAGNOSIS — M79605 Pain in left leg: Secondary | ICD-10-CM

## 2020-11-27 NOTE — Progress Notes (Signed)
Subjective:    Patient ID: Donald Silva, male    DOB: 10/29/1955, 65 y.o.   MRN: [id number] Chief Complaint  Patient presents with  . Follow-up    Est. Pt is 2021 Ls 2021 chronic intestinal ischemic syndrome abi    Donald Silva is a 65 year old male that presents today for evaluation of abdominal pain.  The patient is referred from his primary care provider after noticing having abdominal pain with eating.  He notes this has been going on for several weeks.  He will take an Alka-Seltzer and utilize a heating pad on his stomach and the pain will go away.  He recently saw his primary care provider and noted that he was started on some new medications (omeprazole) and since he started on these medication he has not had any other pain.  He denies any nausea or vomiting.  He denies any fever or chills.  The patient notes that he also has pain in his left lower extremity.  He notes severe numbness and occasional cramping.  He recently increased his Lyrica to twice a day as directed by his neurologist for his known neuropathy.  Since this is happened in the has been decreased.  The patient previously has studies approximately a year ago in regards to the same pain.  He had triphasic waveforms throughout the left lower extremity with no obvious areas of stenosis.  He denies any wounds or ulcerations in the left lower extremity.  He notes that the right lower extremity tends to be fine at this time.  Noninvasive studies today reveal the largest aortic diameter at 2.1 cm.  The patient has normal celiac artery, superior mesenteric artery, inferior mesenteric artery, splenic artery hepatic artery findings.   Review of Systems  Gastrointestinal: Positive for abdominal pain.  Neurological: Positive for numbness.  All other systems reviewed and are negative.      Objective:   Physical Exam Vitals reviewed.  HENT:     Head: Normocephalic.  Neck:     Vascular: No carotid bruit.  Cardiovascular:      Rate and Rhythm: Normal rate.     Pulses: Decreased pulses.  Pulmonary:     Effort: Pulmonary effort is normal.  Musculoskeletal:     Right lower leg: No edema.     Left lower leg: No edema.  Skin:    General: Skin is warm and dry.  Neurological:     Mental Status: He is alert and oriented to person, place, and time.  Psychiatric:        Mood and Affect: Mood normal.        Behavior: Behavior normal.        Thought Content: Thought content normal.        Judgment: Judgment normal.     BP (!) 144/74   Pulse 80   Ht 5\' 11"  (1.803 m)   Wt 236 lb (107 kg)   BMI 32.92 kg/m   Past Medical History:  Diagnosis Date  . CKD (chronic kidney disease) stage 5, GFR less than 15 ml/min (HCC)   . COVID-19   . Diabetes mellitus without complication ([place])   . Dyspnea   . GERD (gastroesophageal reflux disease)   . Hypertension   . Lower extremity edema   . Myocardial infarction [facility])     Social History   Socioeconomic History  . Marital status: Single    Spouse name: Not on file  . Number of children: Not on file  .  Years of education: Not on file  . Highest education level: Not on file  Occupational History  . Not on file  Tobacco Use  . Smoking status: Never Smoker  . Smokeless tobacco: Never Used  Vaping Use  . Vaping Use: Never used  Substance and Sexual Activity  . Alcohol use: No  . Drug use: No  . Sexual activity: Not on file  Other Topics Concern  . Not on file  Social History Narrative  . Not on file   Social Determinants of Health   Financial Resource Strain: Not on file  Food Insecurity: Not on file  Transportation Needs: Not on file  Physical Activity: Not on file  Stress: Not on file  Social Connections: Not on file  Intimate Partner Violence: Not on file    Past Surgical History:  Procedure Laterality Date  . A/V FISTULAGRAM N/A 05/03/2018   Procedure: A/V FISTULAGRAM;  Surgeon: Katha Cabal, MD;  Location: [facility];  Service:  Cardiovascular;  Laterality: N/A;  . AV FISTULA PLACEMENT Left 08/27/2017   Procedure: ARTERIOVENOUS (AV) FISTULA CREATION ( BRACHIAL CEPHALIC );  Surgeon: Katha Cabal, MD;  Location: [facility];  Service: Vascular;  Laterality: Left;  . CHOLECYSTECTOMY    . TOE SURGERY Right     Family History  Problem Relation Age of Onset  . Hypertension Sister   . Diabetes Sister   . Diabetes Father     Allergies  Allergen Reactions  . Ciprofloxacin Other (See Comments)  . Penicillins Anaphylaxis    Was told it could kill him, pt unsure of reaction  Has patient had a PCN reaction causing immediate rash, facial/tongue/throat swelling, SOB or lightheadedness with hypotension: No Has patient had a PCN reaction causing severe rash involving mucus membranes or skin necrosis: No Has patient had a PCN reaction that required hospitalization: No Has patient had a PCN reaction occurring within the last 10 years: No If all of the above answers are "NO", then may proceed with Cephalosporin use.   . Cyclopentolate Other (See Comments)    CBC Latest Ref Rng & Units 07/25/2020 07/24/2020 08/12/2019  WBC 4.0 - 10.5 K/uL 5.6 9.5 7.8  Hemoglobin 13.0 - 17.0 g/dL 9.0(L) 10.3(L) 9.7(L)  Hematocrit 39.0 - 52.0 % 27.3(L) 31.3(L) 29.4(L)  Platelets 150 - 400 K/uL 142(L) 152 213      CMP     Component Value Date/Time   NA 140 07/25/2020 0326   NA 138 11/25/2013 0457   K 4.9 07/25/2020 0326   K 4.3 11/25/2013 0457   CL 98 07/25/2020 0326   CL 105 11/25/2013 0457   CO2 28 07/25/2020 0326   CO2 24 11/25/2013 0457   GLUCOSE 72 07/25/2020 0326   GLUCOSE 150 (H) 11/25/2013 0457   BUN 59 (H) 07/25/2020 0326   BUN 44 (H) 11/25/2013 0457   CREATININE 12.80 (H) 07/25/2020 0326   CREATININE 3.37 (H) 11/25/2013 0457   CALCIUM 8.3 (L) 07/25/2020 0326   CALCIUM 8.5 11/25/2013 0457   PROT 7.9 07/22/2019 0540   PROT 8.2 11/22/2013 0811   ALBUMIN 3.6 07/22/2019 0540   ALBUMIN 2.8 (L) 11/22/2013 0811   AST 22  07/22/2019 0540   AST 16 11/22/2013 0811   ALT 28 07/22/2019 0540   ALT 32 11/22/2013 0811   ALKPHOS 76 07/22/2019 0540   ALKPHOS 181 (H) 11/22/2013 0811   BILITOT 0.6 07/22/2019 0540   BILITOT 0.5 11/22/2013 0811   GFRNONAA 4 (L) 07/25/2020 0326  GFRNONAA 19 (L) 11/25/2013 0457   GFRAA 4 (L) 08/12/2019 1548   GFRAA 22 (L) 11/25/2013 0457     No results found.     Assessment & Plan:   1. Chronic intestinal ischemic syndrome (HCC) Based on noninvasive studies today the patient does not have any evidence of intestinal ischemia.  Based upon the recent medication changes and description of the patient's pain this is likely related to acid reflux.  Noninvasive studies do not indicate any significant stenosis within the mesenteric arteries.  No evidence of abdominal aortic aneurysm.  Patient will continue to follow with primary care provider for treatment of abdominal pain.  2. Essential hypertension Continue antihypertensive medications as already ordered, these medications have been reviewed and there are no changes at this time.   3. Pain of left lower extremity Patient described pain was in his left lower extremity.  Approximately 1 year ago the patient had noncompressible ABIs however he had triphasic waveforms throughout his left lower extremity.  The patient also has known severe neuropathy.  He recently had his Lyrica increased and has noticed some improvement in the pain.  After discussion with the patient we will allow him several weeks to see if this causes improvement in his pain symptoms if not he can contact our office and we can repeat lower extremity arterial duplex to see if this is vascular related or if his pain is purely related to his neuropathy.   Current Outpatient Medications on File Prior to Visit  Medication Sig Dispense Refill  . albuterol (VENTOLIN HFA) 108 (90 Base) MCG/ACT inhaler Inhale 2 puffs into the lungs every 6 (six) hours as needed for wheezing or  shortness of breath.    [name] amLODipine (NORVASC) 10 MG tablet Take 1 tablet (10 mg total) by mouth daily. 30 tablet 0  . aspirin EC 81 MG tablet Take 81 mg daily by mouth.    . calcium carbonate (TUMS EX) 750 MG chewable tablet Chew 2 tablets by mouth at bedtime.    . Cholecalciferol (VITAMIN D) 50 MCG (2000 UT) tablet Take 2,000 Units by mouth daily.    . cilostazol (PLETAL) 50 MG tablet Take 1 tablet by mouth 2 (two) times daily.    . Insulin Detemir (LEVEMIR FLEXTOUCH) 100 UNIT/ML Pen Inject 10 Units into the skin at bedtime. 15 mL 11  . lanthanum (FOSRENOL) 1000 MG chewable tablet Chew 3,000 mg by mouth 3 (three) times daily.    [name] lisinopril (ZESTRIL) 40 MG tablet Take 1 tablet (40 mg total) by mouth daily. 30 tablet 0  . loperamide (IMODIUM) 2 MG capsule Take 1 capsule (2 mg total) by mouth as needed for diarrhea or loose stools. 30 capsule 0  . metoprolol succinate (TOPROL-XL) 50 MG 24 hr tablet Take 1 tablet (50 mg total) by mouth daily. Take with or immediately following a meal. 30 tablet 0  . omeprazole (PRILOSEC) 40 MG capsule Take 1 capsule by mouth daily.    . pregabalin (LYRICA) 150 MG capsule Take 150 mg by mouth 2 (two) times daily.    . pregabalin (LYRICA) 75 MG capsule Take 75 mg by mouth 2 (two) times daily.    [name] rOPINIRole (REQUIP) 2 MG tablet Take 2 mg by mouth at bedtime.    . SYMBICORT 80-4.5 MCG/ACT inhaler Inhale 2 puffs into the lungs 2 (two) times daily.    . traMADol (ULTRAM) 50 MG tablet Take 50 mg by mouth 3 (three) times daily as needed.  No current facility-administered medications on file prior to visit.    There are no Patient Instructions on file for this visit. No follow-ups on file.   Kris Hartmann, NP

## 2020-12-13 ENCOUNTER — Ambulatory Visit: Payer: Medicare Other | Admitting: Podiatry

## 2020-12-20 ENCOUNTER — Other Ambulatory Visit: Payer: Self-pay

## 2020-12-20 ENCOUNTER — Ambulatory Visit (INDEPENDENT_AMBULATORY_CARE_PROVIDER_SITE_OTHER): Payer: Medicare Other | Admitting: Podiatry

## 2020-12-20 ENCOUNTER — Encounter: Payer: Self-pay | Admitting: Podiatry

## 2020-12-20 DIAGNOSIS — M79674 Pain in right toe(s): Secondary | ICD-10-CM

## 2020-12-20 DIAGNOSIS — E0843 Diabetes mellitus due to underlying condition with diabetic autonomic (poly)neuropathy: Secondary | ICD-10-CM | POA: Diagnosis not present

## 2020-12-20 DIAGNOSIS — L989 Disorder of the skin and subcutaneous tissue, unspecified: Secondary | ICD-10-CM | POA: Diagnosis not present

## 2020-12-20 DIAGNOSIS — M79675 Pain in left toe(s): Secondary | ICD-10-CM | POA: Diagnosis not present

## 2020-12-20 DIAGNOSIS — B351 Tinea unguium: Secondary | ICD-10-CM

## 2020-12-20 NOTE — Progress Notes (Signed)
    Subjective: Patient is a 65 y.o. male presenting to the office today with a chief complaint of painful callus lesion(s) noted to the bilateral feet. Walking and bearing weight increases the pain. He has not done anything at home for treatment.  Patient also complains of elongated, thickened nails that cause pain while ambulating in shoes. He is unable to trim his own nails. Patient presents today for further treatment and evaluation.  Past Medical History:  Diagnosis Date  . CKD (chronic kidney disease) stage 5, GFR less than 15 ml/min (HCC)   . COVID-19   . Diabetes mellitus without complication ([place])   . Dyspnea   . GERD (gastroesophageal reflux disease)   . Hypertension   . Lower extremity edema   . Myocardial infarction [facility])     Objective:  Physical Exam General: Alert and oriented x3 in no acute distress  Dermatology: Hyperkeratotic lesion(s) present on the bilateral feet. Pain on palpation with a central nucleated core noted. Skin is warm, dry and supple bilateral lower extremities. Negative for open lesions or macerations. Nails are tender, long, thickened and dystrophic with subungual debris, consistent with onychomycosis, 1-5 left, 2-4 right. No signs of infection noted.  Vascular: Palpable pedal pulses bilaterally. No edema or erythema noted. Capillary refill within normal limits.  Neurological: Epicritic and protective threshold diminished bilaterally.   Musculoskeletal Exam: Pain on palpation at the keratotic lesion(s) noted. 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 Onychodystrophic nails 1-5 bilateral with hyperkeratosis of nails.  2. Onychomycosis of nail due to dermatophyte bilateral 3. Pre-ulcerative callus lesions noted to the bilateral feet x 4 4. 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 Patient evaluated. 2. Excisional debridement of keratoic lesion(s) using a chisel blade was performed without incident.  3. Dressed with  light dressing. 4. Mechanical debridement of nails 1-5 bilaterally performed using a nail nipper. Filed with dremel without incident.  5.  Continue diabetic shoes 6. Patient is to return to the clinic in 3 months.   Edrick Kins, DPM [facility]  Dr. Edrick Kins, DPM    [address], [place] [address]                Office [phone]  Fax [phone]

## 2021-02-11 ENCOUNTER — Encounter: Payer: Self-pay | Admitting: *Deleted

## 2021-02-11 ENCOUNTER — Other Ambulatory Visit: Payer: Self-pay

## 2021-02-11 ENCOUNTER — Emergency Department
Admission: EM | Admit: 2021-02-11 | Discharge: 2021-02-11 | Disposition: A | Discharge status: Home / Self Care | Payer: Medicare Other | Attending: Emergency Medicine | Admitting: Emergency Medicine

## 2021-02-11 ENCOUNTER — Emergency Department: Payer: Medicare Other

## 2021-02-11 DIAGNOSIS — I132 Hypertensive heart and chronic kidney disease with heart failure and with stage 5 chronic kidney disease, or end stage renal disease: Secondary | ICD-10-CM | POA: Insufficient documentation

## 2021-02-11 DIAGNOSIS — I5033 Acute on chronic diastolic (congestive) heart failure: Secondary | ICD-10-CM | POA: Diagnosis not present

## 2021-02-11 DIAGNOSIS — E1122 Type 2 diabetes mellitus with diabetic chronic kidney disease: Secondary | ICD-10-CM | POA: Diagnosis not present

## 2021-02-11 DIAGNOSIS — L299 Pruritus, unspecified: Secondary | ICD-10-CM

## 2021-02-11 DIAGNOSIS — Z7982 Long term (current) use of aspirin: Secondary | ICD-10-CM | POA: Diagnosis not present

## 2021-02-11 DIAGNOSIS — D631 Anemia in chronic kidney disease: Secondary | ICD-10-CM | POA: Insufficient documentation

## 2021-02-11 DIAGNOSIS — Z79899 Other long term (current) drug therapy: Secondary | ICD-10-CM | POA: Diagnosis not present

## 2021-02-11 DIAGNOSIS — Z8616 Personal history of COVID-19: Secondary | ICD-10-CM | POA: Diagnosis not present

## 2021-02-11 DIAGNOSIS — I700 Atherosclerosis of aorta: Secondary | ICD-10-CM

## 2021-02-11 DIAGNOSIS — Z992 Dependence on renal dialysis: Secondary | ICD-10-CM | POA: Diagnosis not present

## 2021-02-11 DIAGNOSIS — J9621 Acute and chronic respiratory failure with hypoxia: Secondary | ICD-10-CM | POA: Diagnosis not present

## 2021-02-11 DIAGNOSIS — Z20822 Contact with and (suspected) exposure to covid-19: Secondary | ICD-10-CM | POA: Insufficient documentation

## 2021-02-11 DIAGNOSIS — E8770 Fluid overload, unspecified: Secondary | ICD-10-CM

## 2021-02-11 DIAGNOSIS — Z7951 Long term (current) use of inhaled steroids: Secondary | ICD-10-CM | POA: Insufficient documentation

## 2021-02-11 DIAGNOSIS — Z794 Long term (current) use of insulin: Secondary | ICD-10-CM | POA: Insufficient documentation

## 2021-02-11 DIAGNOSIS — R778 Other specified abnormalities of plasma proteins: Secondary | ICD-10-CM

## 2021-02-11 DIAGNOSIS — N186 End stage renal disease: Secondary | ICD-10-CM

## 2021-02-11 DIAGNOSIS — R197 Diarrhea, unspecified: Secondary | ICD-10-CM | POA: Diagnosis present

## 2021-02-11 DIAGNOSIS — I2510 Atherosclerotic heart disease of native coronary artery without angina pectoris: Secondary | ICD-10-CM | POA: Insufficient documentation

## 2021-02-11 LAB — COMPREHENSIVE METABOLIC PANEL
ALT: 26 U/L (ref 0–44)
AST: 29 U/L (ref 15–41)
Albumin: 4.3 g/dL (ref 3.5–5.0)
Alkaline Phosphatase: 80 U/L (ref 38–126)
Anion gap: 19 — ABNORMAL HIGH (ref 5–15)
BUN: 85 mg/dL — ABNORMAL HIGH (ref 8–23)
CO2: 31 mmol/L (ref 22–32)
Calcium: 8.9 mg/dL (ref 8.9–10.3)
Chloride: 90 mmol/L — ABNORMAL LOW (ref 98–111)
Creatinine, Ser: 19.64 mg/dL — ABNORMAL HIGH (ref 0.61–1.24)
GFR, Estimated: 2 mL/min — ABNORMAL LOW (ref >60)
Glucose, Bld: 101 mg/dL — ABNORMAL HIGH (ref 70–99)
Potassium: 5.3 mmol/L — ABNORMAL HIGH (ref 3.5–5.1)
Sodium: 140 mmol/L (ref 135–145)
Total Bilirubin: 0.8 mg/dL (ref 0.3–1.2)
Total Protein: 8.6 g/dL — ABNORMAL HIGH (ref 6.5–8.1)

## 2021-02-11 LAB — RESP PANEL BY RT-PCR (FLU A&B, COVID) ARPGX2
Influenza A by PCR: NEGATIVE
Influenza B by PCR: NEGATIVE
SARS Coronavirus 2 by RT PCR: NEGATIVE

## 2021-02-11 LAB — BRAIN NATRIURETIC PEPTIDE: B Natriuretic Peptide: 2409.9 pg/mL — ABNORMAL HIGH (ref 0.0–100.0)

## 2021-02-11 LAB — CBC
HCT: 40.1 % (ref 39.0–52.0)
Hemoglobin: 13.2 g/dL (ref 13.0–17.0)
MCH: 30.5 pg (ref 26.0–34.0)
MCHC: 32.9 g/dL (ref 30.0–36.0)
MCV: 92.6 fL (ref 80.0–100.0)
Platelets: 189 10*3/uL (ref 150–400)
RBC: 4.33 MIL/uL (ref 4.22–5.81)
RDW: 16.4 % — ABNORMAL HIGH (ref 11.5–15.5)
WBC: 6.3 10*3/uL (ref 4.0–10.5)
nRBC: 0 % (ref 0.0–0.2)

## 2021-02-11 LAB — CBG MONITORING, ED: Glucose-Capillary: 101 mg/dL — ABNORMAL HIGH (ref 70–99)

## 2021-02-11 LAB — LIPASE, BLOOD: Lipase: 62 U/L — ABNORMAL HIGH (ref 11–51)

## 2021-02-11 LAB — TROPONIN I (HIGH SENSITIVITY): Troponin I (High Sensitivity): 66 ng/L — ABNORMAL HIGH (ref <18)

## 2021-02-11 LAB — PHOSPHORUS: Phosphorus: 7.8 mg/dL — ABNORMAL HIGH (ref 2.5–4.6)

## 2021-02-11 MED ORDER — SODIUM CHLORIDE 0.9 % IV SOLN: 100.0000 mL | INTRAVENOUS | Status: DC | PRN

## 2021-02-11 MED ORDER — PREGABALIN 75 MG PO CAPS
150.0000 mg | ORAL_CAPSULE | Freq: Two times a day (BID) | ORAL | Status: DC
  Administered 2021-02-11: 150 mg via ORAL
  Filled 2021-02-11: qty 2

## 2021-02-11 MED ORDER — TRAMADOL HCL 50 MG PO TABS: 50.0000 mg | ORAL_TABLET | Freq: Once | ORAL | Status: DC | PRN

## 2021-02-11 MED ORDER — LIDOCAINE-PRILOCAINE 2.5-2.5 % EX CREA: 1.0000 "application " | TOPICAL_CREAM | CUTANEOUS | Status: DC | PRN

## 2021-02-11 MED ORDER — MOMETASONE FURO-FORMOTEROL FUM 100-5 MCG/ACT IN AERO
2.0000 | INHALATION_SPRAY | Freq: Two times a day (BID) | RESPIRATORY_TRACT | Status: DC
  Administered 2021-02-11: 2 via RESPIRATORY_TRACT
  Filled 2021-02-11: qty 8.8

## 2021-02-11 MED ORDER — CHLORHEXIDINE GLUCONATE CLOTH 2 % EX PADS
6.0000 | MEDICATED_PAD | Freq: Every day | CUTANEOUS | Status: DC
  Filled 2021-02-11: qty 6

## 2021-02-11 MED ORDER — PENTAFLUOROPROP-TETRAFLUOROETH EX AERO: 1.0000 "application " | INHALATION_SPRAY | CUTANEOUS | Status: DC | PRN

## 2021-02-11 MED ORDER — CLONIDINE HCL 0.1 MG PO TABS
0.1000 mg | ORAL_TABLET | Freq: Once | ORAL | Status: AC
  Administered 2021-02-11: 0.1 mg via ORAL

## 2021-02-11 MED ORDER — LORATADINE 10 MG PO TABS: 10.0000 mg | ORAL_TABLET | Freq: Every day | ORAL | 1 refills | Status: AC

## 2021-02-11 MED ORDER — LORATADINE 10 MG PO TABS
10.0000 mg | ORAL_TABLET | Freq: Every day | ORAL | Status: DC
  Administered 2021-02-11: 10 mg via ORAL
  Filled 2021-02-11: qty 1

## 2021-02-11 MED ORDER — AMLODIPINE BESYLATE 5 MG PO TABS
10.0000 mg | ORAL_TABLET | Freq: Every day | ORAL | Status: DC
  Administered 2021-02-11: 10 mg via ORAL
  Filled 2021-02-11: qty 2

## 2021-02-11 MED ORDER — CILOSTAZOL 100 MG PO TABS
50.0000 mg | ORAL_TABLET | Freq: Two times a day (BID) | ORAL | Status: DC
  Administered 2021-02-11: 50 mg via ORAL
  Filled 2021-02-11 (×2): qty 0.5

## 2021-02-11 MED ORDER — ALTEPLASE 2 MG IJ SOLR
2.0000 mg | Freq: Once | INTRAMUSCULAR | Status: DC | PRN
  Filled 2021-02-11: qty 2

## 2021-02-11 MED ORDER — LISINOPRIL 10 MG PO TABS
40.0000 mg | ORAL_TABLET | Freq: Every day | ORAL | Status: DC
  Administered 2021-02-11: 40 mg via ORAL
  Filled 2021-02-11: qty 4

## 2021-02-11 MED ORDER — INSULIN ASPART 100 UNIT/ML IJ SOLN: 0.0000 [IU] | INTRAMUSCULAR | Status: DC

## 2021-02-11 MED ORDER — AMLODIPINE BESYLATE 5 MG PO TABS
10.0000 mg | ORAL_TABLET | Freq: Every day | ORAL | Status: DC
  Filled 2021-02-11: qty 2

## 2021-02-11 MED ORDER — HEPARIN SODIUM (PORCINE) 1000 UNIT/ML DIALYSIS
1000.0000 [IU] | INTRAMUSCULAR | Status: DC | PRN
  Filled 2021-02-11: qty 1

## 2021-02-11 MED ORDER — LIDOCAINE HCL (PF) 1 % IJ SOLN: 5.0000 mL | INTRAMUSCULAR | Status: DC | PRN

## 2021-02-11 MED ORDER — PANTOPRAZOLE SODIUM 40 MG PO TBEC
40.0000 mg | DELAYED_RELEASE_TABLET | Freq: Every day | ORAL | Status: DC
  Administered 2021-02-11: 40 mg via ORAL
  Filled 2021-02-11: qty 1

## 2021-02-11 MED ORDER — ALBUTEROL SULFATE HFA 108 (90 BASE) MCG/ACT IN AERS
2.0000 | INHALATION_SPRAY | Freq: Four times a day (QID) | RESPIRATORY_TRACT | Status: DC | PRN
  Filled 2021-02-11: qty 6.7

## 2021-02-11 MED ORDER — ASPIRIN EC 81 MG PO TBEC
81.0000 mg | DELAYED_RELEASE_TABLET | Freq: Every day | ORAL | Status: DC
  Administered 2021-02-11: 81 mg via ORAL
  Filled 2021-02-11: qty 1

## 2021-02-11 MED ORDER — CLONIDINE HCL 0.1 MG PO TABS
ORAL_TABLET | ORAL | Status: AC
  Filled 2021-02-11: qty 1

## 2021-02-11 NOTE — ED Notes (Signed)
Patient discharged to home per MD order. Patient in stable condition, and deemed medically cleared by ED provider for discharge. Discharge instructions reviewed with patient/family using "Teach Back"; verbalized understanding of medication education and administration, and information about follow-up care. Denies further concerns. ° °

## 2021-02-11 NOTE — ED Triage Notes (Addendum)
Pt arrives ambulatory to triage with multiple complaints. He is a dialysis pt t/th/sat. Last treatment Thursday. He c/o itching on his back this time of the morning every morning for three weeks, reports he is unable to go to dialysis this morning @5 :30 because of the itching. Also c/o Pain in his feet from neuropathy. Saturations low 80's on room air, pt says that he is suppose to wear oxygen only at night, arrives on room air, denies any change in his breathing. 2 liters applied in triage.

## 2021-02-11 NOTE — ED Provider Notes (Signed)
[facility] Emergency Department Provider Note  ____________________________________________   Event Date/Time   First MD Initiated Contact with Patient 02/11/21 [phone]     (approximate)  I have reviewed the triage vital signs and the nursing notes.   HISTORY  Chief Complaint Pruritis   HPI Donald Silva is a 65 y.o. male with past medical history of CAD, pHTN, HTN, GERD, DM, peripheral neuropathy, RLS, and ESRD typically on HD Tuesday Thursday Saturday most recently dialyzed this past Thursday on 6/30 presents for assessment of some diarrhea on and off over the last couple of days nonbloody as well as some itching on his back.  Patient states he has had on and off diarrhea over the last several weeks alternating with constipation never associated with back pain abdominal pain nausea or vomiting.  He also states he has had itching in his back most mornings and is not sure if this is related or not as phosphate.  He has been taking his medicines For his phosphate binder and states he has not able to go to dialysis the last 2 sessions because of diarrhea.  He denies any shortness of breath, cough, chest pain, headache, sore throat, fevers, rash or extremity pain.  Denies any history of tobacco abuse, EtOH use, asthma or COPD.  States he typically wears 2 L of oxygen at night but not sure why.  Denies any other acute concerns at this time.  He was placed on 2 L triage as he had an SPO2 in the 80s.         Past Medical History:  Diagnosis Date   CKD (chronic kidney disease) stage 5, GFR less than 15 ml/min (HCC)    COVID-19    Diabetes mellitus without complication ([place])    Dyspnea    GERD (gastroesophageal reflux disease)    Hypertension    Lower extremity edema    Myocardial infarction [facility])     Patient Active Problem List   Diagnosis Date Noted   Fluid overload 07/24/2020   Acute on chronic respiratory failure with hypoxia ([place]) 07/24/2020   Depression  07/24/2020   CAD (coronary artery disease) 07/24/2020   Hypertensive urgency 07/24/2020   COVID-19 07/26/2019   Hypervolemia    RLS (restless legs syndrome)    Acute and chronic respiratory failure (acute-on-chronic) ([place]) 07/18/2019   Pneumonia due to COVID-19 virus 07/18/2019   Acute respiratory failure ([place]) 06/13/2019   Flash pulmonary edema ([place])    ESRD (end stage renal disease) on dialysis ([place])    Acute on chronic diastolic CHF (congestive heart failure) ([place]) 05/22/2019   Postop check 03/02/2019   Generalized abdominal pain 01/31/2019   Diabetes 1.5, managed as type 2 ([place]) 82/99/3716   Complication of vascular access for dialysis 04/20/2018   Anemia in chronic kidney disease 12/03/2017   Coagulation defect, unspecified ([place]) 12/03/2017   Diarrhea, unspecified 12/03/2017   Encounter for immunization 12/03/2017   Fever, unspecified 12/03/2017   Headache, unspecified 12/03/2017   Iron deficiency anemia, unspecified 12/03/2017   Moderate protein-calorie malnutrition ([place]) 12/03/2017   Pain, unspecified 12/03/2017   Pruritus, unspecified 12/03/2017   Secondary hyperparathyroidism of renal origin ([place]) 12/03/2017   Shortness of breath 12/03/2017   Chronic kidney disease, stage V ([place]) 08/11/2017   Essential hypertension 08/11/2017   Type 2 diabetes mellitus with ESRD (end-stage renal disease) ([place]) 08/11/2017   Hyperkalemia 06/24/2017   Multiple-type hyperlipidemia 03/25/2012   Vitamin D deficiency 03/25/2012   ED (erectile dysfunction) of  organic origin 09/14/2011    Past Surgical History:  Procedure Laterality Date   A/V FISTULAGRAM N/A 05/03/2018   Procedure: A/V FISTULAGRAM;  Surgeon: Katha Cabal, MD;  Location: [facility];  Service: Cardiovascular;  Laterality: N/A;   AV FISTULA PLACEMENT Left 08/27/2017   Procedure: ARTERIOVENOUS (AV) FISTULA CREATION ( BRACHIAL CEPHALIC );  Surgeon: Katha Cabal, MD;  Location: [facility] ORS;  Service: Vascular;   Laterality: Left;   CHOLECYSTECTOMY     TOE SURGERY Right     Prior to Admission medications   Medication Sig Start Date End Date Taking? Authorizing Provider  albuterol (VENTOLIN HFA) 108 (90 Base) MCG/ACT inhaler Inhale 2 puffs into the lungs every 6 (six) hours as needed for wheezing or shortness of breath. 07/12/20   [provider]  amLODipine (NORVASC) 10 MG tablet Take 1 tablet (10 mg total) by mouth daily. 08/12/19   Carrie Mew, MD  aspirin EC 81 MG tablet Take 81 mg daily by mouth.    [provider]  calcium carbonate (TUMS EX) 750 MG chewable tablet Chew 2 tablets by mouth at bedtime.    [provider]  Cholecalciferol (VITAMIN D) 50 MCG (2000 UT) tablet Take 2,000 Units by mouth daily.    [provider]  cilostazol (PLETAL) 50 MG tablet Take 1 tablet by mouth 2 (two) times daily. 11/11/20 11/11/21  [provider]  Insulin Detemir (LEVEMIR FLEXTOUCH) 100 UNIT/ML Pen Inject 10 Units into the skin at bedtime. 07/22/19   Loletha Grayer, MD  lanthanum (FOSRENOL) 1000 MG chewable tablet Chew 3,000 mg by mouth 3 (three) times daily. 09/13/19   [provider]  lisinopril (ZESTRIL) 40 MG tablet Take 1 tablet (40 mg total) by mouth daily. 08/12/19   Carrie Mew, MD  loperamide (IMODIUM) 2 MG capsule Take 1 capsule (2 mg total) by mouth as needed for diarrhea or loose stools. 07/25/20   Nicole Kindred A, DO  metoprolol succinate (TOPROL-XL) 50 MG 24 hr tablet Take 1 tablet (50 mg total) by mouth daily. Take with or immediately following a meal. 07/22/19   Loletha Grayer, MD  omeprazole (PRILOSEC) 40 MG capsule Take 1 capsule by mouth daily. 11/11/20   [provider]  pregabalin (LYRICA) 150 MG capsule Take 150 mg by mouth 2 (two) times daily. 10/26/20   [provider]  pregabalin (LYRICA) 75 MG capsule Take 75 mg by mouth 2 (two) times daily. 07/11/20   [provider]  rOPINIRole (REQUIP) 2 MG tablet Take  2 mg by mouth at bedtime. 09/20/19   [provider]  SYMBICORT 80-4.5 MCG/ACT inhaler Inhale 2 puffs into the lungs 2 (two) times daily. 07/12/20   [provider]  traMADol (ULTRAM) 50 MG tablet Take 50 mg by mouth 3 (three) times daily as needed. 10/10/20   [provider]    Allergies Ciprofloxacin, Penicillins, and Cyclopentolate  Family History  Problem Relation Age of Onset   Hypertension Sister    Diabetes Sister    Diabetes Father     Social History Social History   Tobacco Use   Smoking status: Never   Smokeless tobacco: Never  Vaping Use   Vaping Use: Never used  Substance Use Topics   Alcohol use: No   Drug use: No    Review of Systems  Review of Systems  Constitutional:  Negative for chills and fever.  HENT:  Negative for sore throat.   Eyes:  Negative for pain.  Respiratory:  Negative for cough and stridor.   Cardiovascular:  Negative for chest pain.  Gastrointestinal:  Positive for diarrhea. Negative for vomiting.  Genitourinary:  Negative for dysuria.  Musculoskeletal:  Negative for myalgias.  Skin:  Positive for itching. Negative for rash.  Neurological:  Negative for seizures, loss of consciousness and headaches.  Psychiatric/Behavioral:  Negative for suicidal ideas.   All other systems reviewed and are negative.    ____________________________________________   PHYSICAL EXAM:  VITAL SIGNS: ED Triage Vitals [02/11/21 0456]  Enc Vitals Group     BP 117/85     Pulse Rate (!) 110     Resp (!) 22     Temp (!) 97.1 F (36.2 C)     Temp src      SpO2 (!) 84 %     Weight      Height      Head Circumference      Peak Flow      Pain Score      Pain Loc      Pain Edu?      Excl. in [place]?    Vitals:   02/11/21 0735 02/11/21 1000  BP: (!) 201/106 (!) 218/129  Pulse: 75 77  Resp: 16 10  Temp:    SpO2: 95% 94%   Physical Exam Vitals and nursing note reviewed.  Constitutional:      Appearance: He is  well-developed.  HENT:     Head: Normocephalic and atraumatic.     Right Ear: External ear normal.     Left Ear: External ear normal.     Nose: Nose normal.  Eyes:     Conjunctiva/sclera: Conjunctivae normal.  Cardiovascular:     Rate and Rhythm: Normal rate and regular rhythm.     Heart sounds: No murmur heard. Pulmonary:     Effort: Pulmonary effort is normal. No respiratory distress.     Breath sounds: Normal breath sounds.  Abdominal:     Palpations: Abdomen is soft.     Tenderness: There is no abdominal tenderness. There is no right CVA tenderness or left CVA tenderness.  Musculoskeletal:     Cervical back: Neck supple.     Right lower leg: Edema present.     Left lower leg: Edema present.  Skin:    General: Skin is warm and dry.  Neurological:     Mental Status: He is alert and oriented to person, place, and time.  Psychiatric:        Mood and Affect: Mood normal.    There are very superficial excoriations over patient's bilateral upper back but no induration, streaking, warmth, edema, drainage bleeding or other overlying skin changes. ____________________________________________   LABS (all labs ordered are listed, but only abnormal results are displayed)  Labs Reviewed  LIPASE, BLOOD - Abnormal; Notable for the following components:      Result Value   Lipase 62 (*)    All other components within normal limits  COMPREHENSIVE METABOLIC PANEL - Abnormal; Notable for the following components:   Potassium 5.3 (*)    Chloride 90 (*)    Glucose, Bld 101 (*)    BUN 85 (*)    Creatinine, Ser 19.64 (*)    Total Protein 8.6 (*)    GFR, Estimated 2 (*)    Anion gap 19 (*)    All other components within normal limits  CBC - Abnormal; Notable for the following components:   RDW 16.4 (*)    All other components  within normal limits  BRAIN NATRIURETIC PEPTIDE - Abnormal; Notable for the following components:   B Natriuretic Peptide 2,409.9 (*)    All other components  within normal limits  PHOSPHORUS - Abnormal; Notable for the following components:   Phosphorus 7.8 (*)    All other components within normal limits  CBG MONITORING, ED - Abnormal; Notable for the following components:   Glucose-Capillary 101 (*)    All other components within normal limits  TROPONIN I (HIGH SENSITIVITY) - Abnormal; Notable for the following components:   Troponin I (High Sensitivity) 66 (*)    All other components within normal limits  RESP PANEL BY RT-PCR (FLU A&B, COVID) ARPGX2  URINALYSIS, COMPLETE (UACMP) WITH MICROSCOPIC  HEMOGLOBIN A1C  TROPONIN I (HIGH SENSITIVITY)   ____________________________________________  EKG  Sinus rhythm with a ventricular of 68, artifact in V3, right bundle branch block and left anterior fascicle block with nonspecific changes in anterior lateral leads and QTc interval of 537. ____________________________________________  RADIOLOGY  ED MD interpretation: Chest x-ray remarkable for cardiomegaly and bilateral pulmonary edema without large effusion, pneumothorax or clear focal consolidation.  Official radiology report(s): DG Chest Portable 1 View  Result Date: 02/11/2021 CLINICAL DATA:  Short of breath.  Hypertension.  On dialysis. EXAM: PORTABLE CHEST 1 VIEW COMPARISON:  07/27/2020 FINDINGS: Patient rotated minimally right. Midline trachea. Mild cardiomegaly. Atherosclerosis in the transverse aorta. Left axillary/subclavian vascular stent. No pleural effusion or pneumothorax. Pulmonary interstitial prominence is decreased compared to the prior exam. No lobar consolidation. IMPRESSION: Cardiomegaly with mild pulmonary interstitial prominence, suspicious for venous congestion. Aortic Atherosclerosis (ICD10-I70.0). Electronically Signed   By: Abigail Miyamoto M.D.   On: 02/11/2021 07:53    ____________________________________________   PROCEDURES  Procedure(s) performed (including Critical Care):  .Critical Care  Date/Time: 02/11/2021  7:56 AM Performed by: Lucrezia Starch, MD Authorized by: Lucrezia Starch, MD   Critical care provider statement:    Critical care time (minutes):  45   Critical care was necessary to treat or prevent imminent or life-threatening deterioration of the following conditions:  Respiratory failure   Critical care was time spent personally by me on the following activities:  Discussions with consultants, evaluation of patient's response to treatment, examination of patient, ordering and performing treatments and interventions, ordering and review of laboratory studies, ordering and review of radiographic studies, pulse oximetry, re-evaluation of patient's condition, obtaining history from patient or surrogate and review of old charts   ____________________________________________   INITIAL IMPRESSION / [place] / ED COURSE      Patient presents with above to history exam for assessment of 2 chief complaints including daily itching over last 3 to 4 weeks in his back as well as some intermittent diarrhea alternating constipation of the last several weeks.  Of note patient typically wears some oxygen at night and during the day he was noted to be hypoxic on arrival.  Additional trial of ambulation he decrease his sats to 70% on room air which improved back to the mid 90s on 2 L.  Otherwise he is noted to be slight tachycardic at 110, tachypneic at 22 with otherwise stable vital signs on room air.  He does appear mildly overloaded on exam and I suspect his hypoxia is likely secondary to volume overload in setting of missed dialysis.  He has no fever or leukocytosis or history of cough or findings on x-ray to suggest a pneumonia.  There is no evidence on chest x-ray of pneumothorax and overall  I have a low suspicion for PE or dissection given overall findings much more suggestive of volume overload.  While ECG has nonspecific findings and troponin is slightly elevated 66 this is down from his  last checked at 85-year ago and I suspect represents mild demand ischemia in setting of overload and patient denies any chest pain with relatively unchanged EKG Evalose patient for coronary thrombosis at this time.  Discussed this with concern with on-call nephrologist Dr. Holley Raring who stated he would arrange for pt to be dialyzed.    Currently has itching on his back there is no evidence of cellulitis or history of any new medication or chemical exposures to suggest acute allergic reaction.  In addition there is no plaques erythema macules papules or other findings to suggest acute dermatitis abscess or other clear immediate life-threatening process.  I think with regards to itching he is stable for close outpatient dermatology follow-up and will defer steroids given history of poorly controlled diabetes.  Regards to history of intermittent diarrhea symptoms alternating with some constipation on and off for last several weeks.  He describes as nonbloody and not associate with any pain or nausea or vomiting.  His abdomen is soft nontender and overall Evalose patient for acute appendicitis, diverticulitis or SBO.  Lipase is 62 is not consistent with acute pancreatitis.  CBC has no leukocytosis or acute anemia given absence of fever or CVA tenderness I have low suspicion for [place] or stone.  CMP remarkable for K of 5.3, CL 90, BUN of 85, creatinine of 19.64 anion gap of 19 with unremarkable LFTs and no evidence of cholestasis.  Care patient signed over to oncoming provider approximately 1500.  Plan is to reassess patient after he returns from dialysis to see if he still has ambulatory oxygen requirement and if not he will likely be safe for discharge with outpatient follow-up.  Otherwise he will require admission for likely additional dialysis tomorrow.       ____________________________________________   FINAL CLINICAL IMPRESSION(S) / ED DIAGNOSES  Final diagnoses:  Acute on chronic respiratory  failure with hypoxia (HCC)  ESRD (end stage renal disease) (HCC)  Hypervolemia, unspecified hypervolemia type  Diarrhea, unspecified type  Itching  Troponin I above reference range  Aortic atherosclerosis (HCC)    Medications  amLODipine (NORVASC) tablet 10 mg (10 mg Oral Given 02/11/21 0736)  lisinopril (ZESTRIL) tablet 40 mg (40 mg Oral Given 02/11/21 0808)  pregabalin (LYRICA) capsule 150 mg (150 mg Oral Given 02/11/21 0809)  traMADol (ULTRAM) tablet 50 mg (has no administration in time range)  loratadine (CLARITIN) tablet 10 mg (10 mg Oral Given 02/11/21 0914)  cilostazol (PLETAL) tablet 50 mg (50 mg Oral Given 02/11/21 1342)  insulin aspart (novoLOG) injection 0-15 Units (0 Units Subcutaneous Not Given 02/11/21 1239)  aspirin EC tablet 81 mg (81 mg Oral Given 02/11/21 1343)  albuterol (VENTOLIN HFA) 108 (90 Base) MCG/ACT inhaler 2 puff (has no administration in time range)  pantoprazole (PROTONIX) EC tablet 40 mg (40 mg Oral Given 02/11/21 1343)  mometasone-formoterol (DULERA) 100-5 MCG/ACT inhaler 2 puff (2 puffs Inhalation Given 02/11/21 1343)  Chlorhexidine Gluconate Cloth 2 % PADS 6 each (has no administration in time range)  pentafluoroprop-tetrafluoroeth (GEBAUERS) aerosol 1 application (has no administration in time range)  lidocaine (PF) (XYLOCAINE) 1 % injection 5 mL (has no administration in time range)  lidocaine-prilocaine (EMLA) cream 1 application (has no administration in time range)  0.9 %  sodium chloride infusion (has no administration in time  range)  0.9 %  sodium chloride infusion (has no administration in time range)  heparin injection 1,000 Units (has no administration in time range)  alteplase (CATHFLO ACTIVASE) injection 2 mg (has no administration in time range)     ED Discharge Orders     None        Note:  This document was prepared using Dragon voice recognition software and may include unintentional dictation errors.    Lucrezia Starch, MD 02/11/21  [phone]

## 2021-02-11 NOTE — Discharge Instructions (Addendum)
Go to your normal dialysis as scheduled.  Follow-up with your regular kidney doctor.  Return to the ER for new or worsening shortness of breath, chest pain, weakness, itching, or any other new or worsening symptoms that concern you.

## 2021-02-11 NOTE — ED Notes (Signed)
Assisted pt to restroom in w/c. Pt says that he has been having issues with constipation/diarrhea.

## 2021-02-11 NOTE — ED Notes (Signed)
Pt refusing CBG and vitals at this time due to "I just went to sleep and haven't slept in days, I will do it later".

## 2021-02-11 NOTE — ED Provider Notes (Signed)
-----------------------------------------   9:11 PM on 02/11/2021 -----------------------------------------  The patient has returned from dialysis.  On reassessment he appears comfortable.  He states he feels better.  He is currently on 3 L of O2 by nasal cannula and is maintaining an O2 saturation in the high 90s.  He states that he has been using 2 to 3 L at night.  We did a trial of ambulation on 3 L and he had no drop in his O2 saturation.  He felt well walking around.  His blood pressure has improved from earlier.  At this time, the patient wants to go home.  He is stable for discharge.  I counseled him on the results of the work-up.  I recommended that he follow-up with his nephrologist and PMD.  He states that the Claritin that was given earlier helped a lot with his itching so I prescribed it for home.  I gave the patient thorough return precautions and he expressed understanding.   Arta Silence, MD 02/11/21 2115

## 2021-02-11 NOTE — ED Notes (Signed)
Pt presents to ED with c/o of upper back itching that has been ongoing for a few weeks. Pt states he is a dialysis pt and states he has missed 2 rounds of dialysis due to the itching and issues with intermittent diarrhea. Pt states he normally wears 2L/min via [place] at night. When ambulating on RA pt does de-sat to the lower 80's, pt denies feeling SOB at this time. Pt placed back on 2L/min. Pt is currently A&Ox4. Pt appears in NAD at this time.

## 2021-02-11 NOTE — Progress Notes (Signed)
[facility]  ROUNDING NOTE   Subjective:   Donald Silva is a 65 year old male with past medical history including diabetes, dyspnea, GERD, hypertension, COVID-19 infection, CKD stage V, and ESRD on dialysis.  He presents to the emergency room with complaints of severe pruritus across his back.  Patient states he recently started an antacid medication.  He states the medication was not explained properly, he felt that he could just take the new medication and it would take the place of his phosphorus binder.  He began only taking the an acid and developed diarrhea.  In the process of this he began to have itching that has progressed across his upper to middle back.  He states he has missed 2 dialysis treatments based on this itching.  He was without his phosphorus binders for 2 days.  Denies nausea, vomiting, current diarrhea.  He states he stopped taking the antiacid medication when the diarrhea started.  Denies shortness of breath.  Currently wears oxygen as needed at home at nighttime only.  Patient tachypneic at this time and has been placed on 2 L of oxygen.  He appears anxious and restless.  Lab work on arrival shows sodium 140, potassium 5.3, BUN 85, creatinine 19.64, EGFR 2, and phosphorus 7.8.  We have been consulted to provide dialysis.    Objective:  Vital signs in last 24 hours:  Temp:  [97.1 F (36.2 C)] 97.1 F (36.2 C) (07/05 0456) Pulse Rate:  [75-110] 77 (07/05 1000) Resp:  [10-22] 10 (07/05 1000) BP: (117-218)/(85-129) 218/129 (07/05 1000) SpO2:  [84 %-95 %] 94 % (07/05 1000)  Weight change:  There were no vitals filed for this visit.  Intake/Output: No intake/output data recorded.   Intake/Output this shift:  No intake/output data recorded.  Physical Exam: General: NAD, restless, anxious  Head: Normocephalic, atraumatic. Moist oral mucosal membranes  Eyes: Anicteric  Lungs:  Clear to auscultation, 2L [place], tachypneic  Heart: Regular rate and rhythm   Abdomen:  Soft, nontender  Extremities:  trace peripheral edema.  Neurologic: Alert, moving all four extremities  Skin: No lesions  Access: Lt AVF    Basic Metabolic Panel: Recent Labs  Lab 02/11/21 0526  NA 140  K 5.3*  CL 90*  CO2 31  GLUCOSE 101*  BUN 85*  CREATININE 19.64*  CALCIUM 8.9  PHOS 7.8*    Liver Function Tests: Recent Labs  Lab 02/11/21 0526  AST 29  ALT 26  ALKPHOS 80  BILITOT 0.8  PROT 8.6*  ALBUMIN 4.3   Recent Labs  Lab 02/11/21 0526  LIPASE 62*   No results for input(s): AMMONIA in the last 168 hours.  CBC: Recent Labs  Lab 02/11/21 0526  WBC 6.3  HGB 13.2  HCT 40.1  MCV 92.6  PLT 189    Cardiac Enzymes: No results for input(s): CKTOTAL, CKMB, CKMBINDEX, TROPONINI in the last 168 hours.  BNP: Invalid input(s): POCBNP  CBG: Recent Labs  Lab 02/11/21 [place]*    Microbiology: Results for orders placed or performed during the hospital encounter of 02/11/21  Resp Panel by RT-PCR (Flu A&B, Covid) Nasopharyngeal Swab     Status: None   Collection Time: 02/11/21  7:34 AM   Specimen: Nasopharyngeal Swab; Nasopharyngeal(NP) swabs in vial transport medium  Result Value Ref Range Status   SARS Coronavirus 2 by RT PCR NEGATIVE NEGATIVE Final    Comment: (NOTE) SARS-CoV-2 target nucleic acids are NOT DETECTED.  The SARS-CoV-2 RNA is generally  detectable in upper respiratory specimens during the acute phase of infection. The lowest concentration of SARS-CoV-2 viral copies this assay can detect is 138 copies/mL. A negative result does not preclude SARS-Cov-2 infection and should not be used as the sole basis for treatment or other patient management decisions. A negative result may occur with  improper specimen collection/handling, submission of specimen other than nasopharyngeal swab, presence of viral mutation(s) within the areas targeted by this assay, and inadequate number of viral copies(<138 copies/mL). A negative  result must be combined with clinical observations, patient history, and epidemiological information. The expected result is Negative.  Fact Sheet for Patients:  EntrepreneurPulse.com.au  Fact Sheet for Healthcare Providers:  IncredibleEmployment.be  This test is no t yet approved or cleared by the [place] FDA and  has been authorized for detection and/or diagnosis of SARS-CoV-2 by FDA under an Emergency Use Authorization (EUA). This EUA will remain  in effect (meaning this test can be used) for the duration of the COVID-19 declaration under Section 564(b)(1) of the Act, 21 U.S.C.section 360bbb-3(b)(1), unless the authorization is terminated  or revoked sooner.       Influenza A by PCR NEGATIVE NEGATIVE Final   Influenza B by PCR NEGATIVE NEGATIVE Final    Comment: (NOTE) The Xpert Xpress SARS-CoV-2/FLU/RSV plus assay is intended as an aid in the diagnosis of influenza from Nasopharyngeal swab specimens and should not be used as a sole basis for treatment. Nasal washings and aspirates are unacceptable for Xpert Xpress SARS-CoV-2/FLU/RSV testing.  Fact Sheet for Patients: EntrepreneurPulse.com.au  Fact Sheet for Healthcare Providers: IncredibleEmployment.be  This test is not yet approved or cleared by the [place] FDA and has been authorized for detection and/or diagnosis of SARS-CoV-2 by FDA under an Emergency Use Authorization (EUA). This EUA will remain in effect (meaning this test can be used) for the duration of the COVID-19 declaration under Section 564(b)(1) of the Act, 21 U.S.C. section 360bbb-3(b)(1), unless the authorization is terminated or revoked.  Performed at [facility], [address] [place], [place] [address]     Coagulation Studies: No results for input(s): LABPROT, INR in the last 72 hours.  Urinalysis: No results for input(s): COLORURINE, LABSPEC,  PHURINE, GLUCOSEU, HGBUR, BILIRUBINUR, KETONESUR, PROTEINUR, UROBILINOGEN, NITRITE, LEUKOCYTESUR in the last 72 hours.  Invalid input(s): APPERANCEUR    Imaging: DG Chest Portable 1 View  Result Date: 02/11/2021 CLINICAL DATA:  Short of breath.  Hypertension.  On dialysis. EXAM: PORTABLE CHEST 1 VIEW COMPARISON:  07/27/2020 FINDINGS: Patient rotated minimally right. Midline trachea. Mild cardiomegaly. Atherosclerosis in the transverse aorta. Left axillary/subclavian vascular stent. No pleural effusion or pneumothorax. Pulmonary interstitial prominence is decreased compared to the prior exam. No lobar consolidation. IMPRESSION: Cardiomegaly with mild pulmonary interstitial prominence, suspicious for venous congestion. Aortic Atherosclerosis (ICD10-I70.0). Electronically Signed   By: Abigail Miyamoto M.D.   On: 02/11/2021 07:53     Medications:     amLODipine  10 mg Oral Daily   aspirin EC  81 mg Oral Daily   [START ON 02/12/2021] Chlorhexidine Gluconate Cloth  6 each Topical Q0600   cilostazol  50 mg Oral BID   insulin aspart  0-15 Units Subcutaneous Q4H   lisinopril  40 mg Oral Daily   loratadine  10 mg Oral Daily   mometasone-formoterol  2 puff Inhalation BID   pantoprazole  40 mg Oral Daily   pregabalin  150 mg Oral BID   albuterol, traMADol  Assessment/ Plan:  Donald Silva  is a 65 y.o.  male with past medical history including diabetes, dyspnea, GERD, hypertension, COVID-19 infection, CKD stage V, and ESRD on dialysis.  He presents to the emergency room with complaints of severe pruritus across his back.  Pruritis likely due to missed phosphorus binder medications along with missed dialysis treatments  Missed two days of binder medications and 2 days of dialysis treatments  Will perform dialysis today  Educated patient on medication and proper administration  2. End stage renal disease on dialysis  Will dialysis patient today Due to increased oxygen requirement, was not able to  send for scheduled treatment at outpatient clinic.   3.Anemia of chronic kidney disease Lab Results  Component Value Date   HGB 13.2 02/11/2021   Hgb at goal    LOS: 0   7/5/202212:53 PM

## 2021-02-11 NOTE — ED Notes (Signed)
Pt ambulated on 3l per [place] without any distress or difficulty, EDP made aware.

## 2021-02-11 NOTE — ED Notes (Signed)
Pt keeps constantly using call bell or keeps yelling out to ask for food and drink, pt advised he is NPO at this time.   Pt assisted multiple times to bedside toilet and addressed neuropathy pain with Lyrica.   Pt demands this RN and MD get him to dialysis "right now", pt educated that is not something that can be done right now and that he needed to go this morning at his scheduled time. Pt is unsure if they have his spot open still, pt not understanding the process at this time.   Pt educated on the importance of going to dialysis when it is scheduled.

## 2021-02-11 NOTE — ED Notes (Signed)
Report from dialysis at this time, states pt to be returning back. Reporting nurse states that pt had 2L taken off during dialysis, pt had a tremor on arrival and it has continued, pt BP is elevated, last reading was 187/105, pt received 0.1 clonidine at approx 1839.

## 2021-02-24 ENCOUNTER — Emergency Department
Admission: EM | Admit: 2021-02-24 | Discharge: 2021-02-24 | Disposition: A | Discharge status: Home / Self Care | Payer: Medicare Other | Attending: Emergency Medicine | Admitting: Emergency Medicine

## 2021-02-24 ENCOUNTER — Encounter: Payer: Self-pay | Admitting: Emergency Medicine

## 2021-02-24 ENCOUNTER — Other Ambulatory Visit: Payer: Self-pay

## 2021-02-24 DIAGNOSIS — Z794 Long term (current) use of insulin: Secondary | ICD-10-CM | POA: Diagnosis not present

## 2021-02-24 DIAGNOSIS — Z79899 Other long term (current) drug therapy: Secondary | ICD-10-CM | POA: Insufficient documentation

## 2021-02-24 DIAGNOSIS — Z7982 Long term (current) use of aspirin: Secondary | ICD-10-CM | POA: Diagnosis not present

## 2021-02-24 DIAGNOSIS — Z992 Dependence on renal dialysis: Secondary | ICD-10-CM | POA: Diagnosis not present

## 2021-02-24 DIAGNOSIS — Z8616 Personal history of COVID-19: Secondary | ICD-10-CM | POA: Diagnosis not present

## 2021-02-24 DIAGNOSIS — R197 Diarrhea, unspecified: Secondary | ICD-10-CM

## 2021-02-24 DIAGNOSIS — I132 Hypertensive heart and chronic kidney disease with heart failure and with stage 5 chronic kidney disease, or end stage renal disease: Secondary | ICD-10-CM | POA: Insufficient documentation

## 2021-02-24 DIAGNOSIS — I2510 Atherosclerotic heart disease of native coronary artery without angina pectoris: Secondary | ICD-10-CM | POA: Diagnosis not present

## 2021-02-24 DIAGNOSIS — I5033 Acute on chronic diastolic (congestive) heart failure: Secondary | ICD-10-CM | POA: Diagnosis not present

## 2021-02-24 DIAGNOSIS — N186 End stage renal disease: Secondary | ICD-10-CM | POA: Insufficient documentation

## 2021-02-24 LAB — COMPREHENSIVE METABOLIC PANEL
ALT: 15 U/L (ref 0–44)
AST: 18 U/L (ref 15–41)
Albumin: 4.2 g/dL (ref 3.5–5.0)
Alkaline Phosphatase: 68 U/L (ref 38–126)
Anion gap: 20 — ABNORMAL HIGH (ref 5–15)
BUN: 89 mg/dL — ABNORMAL HIGH (ref 8–23)
CO2: 23 mmol/L (ref 22–32)
Calcium: 9.4 mg/dL (ref 8.9–10.3)
Chloride: 95 mmol/L — ABNORMAL LOW (ref 98–111)
Creatinine, Ser: 20.43 mg/dL — ABNORMAL HIGH (ref 0.61–1.24)
GFR, Estimated: 2 mL/min — ABNORMAL LOW (ref >60)
Glucose, Bld: 130 mg/dL — ABNORMAL HIGH (ref 70–99)
Potassium: 4.6 mmol/L (ref 3.5–5.1)
Sodium: 138 mmol/L (ref 135–145)
Total Bilirubin: 0.8 mg/dL (ref 0.3–1.2)
Total Protein: 8.2 g/dL — ABNORMAL HIGH (ref 6.5–8.1)

## 2021-02-24 LAB — CBC
HCT: 36.5 % — ABNORMAL LOW (ref 39.0–52.0)
Hemoglobin: 12.3 g/dL — ABNORMAL LOW (ref 13.0–17.0)
MCH: 30.3 pg (ref 26.0–34.0)
MCHC: 33.7 g/dL (ref 30.0–36.0)
MCV: 89.9 fL (ref 80.0–100.0)
Platelets: 185 10*3/uL (ref 150–400)
RBC: 4.06 MIL/uL — ABNORMAL LOW (ref 4.22–5.81)
RDW: 16.1 % — ABNORMAL HIGH (ref 11.5–15.5)
WBC: 6.3 10*3/uL (ref 4.0–10.5)
nRBC: 0 % (ref 0.0–0.2)

## 2021-02-24 LAB — LIPASE, BLOOD: Lipase: 58 U/L — ABNORMAL HIGH (ref 11–51)

## 2021-02-24 NOTE — ED Triage Notes (Addendum)
Arrives with multiple complaints.  States receives dialysis T=Th-S.  Missed dialysis last week Tuesday and Saturday.  Due for dialysis tomorrow.  C/O diarrhea after eating x 3 days.  Also states diarrhea is black  AAOx3.  Skin warm and dry. NAD.  No SOB/ DOE  Wears 3l/ [place] oxygen at home at night time.

## 2021-02-24 NOTE — ED Notes (Signed)
Pt via POV from home. Pt states that he missed diaylsis last Tuesday and Saturday because the pain from his peripheral neuropathy was so bad. Pt states he thinks he needs dialysis. Pt dialysis is TThS. Pt states he also is having generalized abd pain, diarrhea, and nausea for the past 3 days. Pt is A&Ox4 and NAD.

## 2021-02-24 NOTE — Discharge Instructions (Addendum)
Please seek medical attention for any high fevers, chest pain, shortness of breath, change in behavior, persistent vomiting, bloody stool or any other new or concerning symptoms.  

## 2021-02-24 NOTE — ED Provider Notes (Signed)
[facility] Emergency Department Provider Note  ____________________________________________   I have reviewed the triage vital signs and the nursing notes.   HISTORY  Chief Complaint Diarrhea   History limited by: Not Limited   HPI Donald Silva is a 65 y.o. male who presents to the emergency department today because of concern for diarrhea. The patient states that the diarrhea started a couple of days ago. It started shortly after he started taking apple cider vinegar for neuropathy. He states that now he feels like every time he goes to eat something it just comes straight through him. States he did have one episode of diarrhea that looked black but since then has had some brown and yellow diarrhea. The patient states that he has had decreased appetite as well. The patient denies any fevers. States he has missed a couple of recent dialysis sessions because of his neuropathy pain. The patient denies any fevers. He did try taking some immodium a few hours prior to arrival and states that he feels like the diarrhea is slowing down.   Records reviewed. Per medical record review patient has a history of CKD. DM.  Past Medical History:  Diagnosis Date   CKD (chronic kidney disease) stage 5, GFR less than 15 ml/min (HCC)    COVID-19    Diabetes mellitus without complication ([place])    Dyspnea    GERD (gastroesophageal reflux disease)    Hypertension    Lower extremity edema    Myocardial infarction [facility])     Patient Active Problem List   Diagnosis Date Noted   Fluid overload 07/24/2020   Acute on chronic respiratory failure with hypoxia ([place]) 07/24/2020   Depression 07/24/2020   CAD (coronary artery disease) 07/24/2020   Hypertensive urgency 07/24/2020   COVID-19 07/26/2019   Hypervolemia    RLS (restless legs syndrome)    Acute and chronic respiratory failure (acute-on-chronic) ([place]) 07/18/2019   Pneumonia due to COVID-19 virus 07/18/2019   Acute  respiratory failure ([place]) 06/13/2019   Flash pulmonary edema ([place])    ESRD (end stage renal disease) on dialysis ([place])    Acute on chronic diastolic CHF (congestive heart failure) ([place]) 05/22/2019   Postop check 03/02/2019   Generalized abdominal pain 01/31/2019   Diabetes 1.5, 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/93/8182   Complication of vascular access for dialysis 04/20/2018   Anemia in chronic kidney disease 12/03/2017   Coagulation defect, unspecified ([place]) 12/03/2017   Diarrhea, unspecified 12/03/2017   Encounter for immunization 12/03/2017   Fever, unspecified 12/03/2017   Headache, unspecified 12/03/2017   Iron deficiency anemia, unspecified 12/03/2017   Moderate protein-calorie malnutrition ([place]) 12/03/2017   Pain, unspecified 12/03/2017   Pruritus, unspecified 12/03/2017   Secondary hyperparathyroidism of renal origin ([place]) 12/03/2017   Shortness of breath 12/03/2017   Chronic kidney disease, stage V ([place]) 08/11/2017   Essential hypertension 08/11/2017   Type 2 diabetes mellitus with ESRD (end-stage renal disease) ([place]) 08/11/2017   Hyperkalemia 06/24/2017   Multiple-type hyperlipidemia 03/25/2012   Vitamin D deficiency 03/25/2012   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 Authorizing Provider  albuterol (VENTOLIN HFA) 108 (90 Base) MCG/ACT inhaler Inhale 2 puffs into the lungs every 6 (six) hours as needed for wheezing  or shortness of breath. 07/12/20   [provider]  amLODipine (NORVASC) 10 MG tablet Take 1 tablet (10 mg total) by mouth daily. 08/12/19   Carrie Mew, MD  aspirin EC 81 MG tablet Take 81 mg daily by mouth.    [provider]  calcium carbonate (TUMS EX) 750 MG chewable tablet Chew 2 tablets by mouth at bedtime.    [provider]  Cholecalciferol (VITAMIN D) 50 MCG (2000 UT) tablet Take 2,000 Units by mouth daily.    [provider]  cilostazol (PLETAL) 50 MG tablet Take 1 tablet by mouth 2 (two) times daily. 11/11/20 11/11/21  [provider]  Insulin Detemir (LEVEMIR FLEXTOUCH) 100 UNIT/ML Pen Inject 10 Units into the skin at bedtime. 07/22/19   Loletha Grayer, MD  lanthanum (FOSRENOL) 1000 MG chewable tablet Chew 3,000 mg by mouth 3 (three) times daily. 09/13/19   [provider]  lisinopril (ZESTRIL) 40 MG tablet Take 1 tablet (40 mg total) by mouth daily. 08/12/19   Carrie Mew, MD  loperamide (IMODIUM) 2 MG capsule Take 1 capsule (2 mg total) by mouth as needed for diarrhea or loose stools. 07/25/20   Ezekiel Slocumb, DO  loratadine (CLARITIN) 10 MG tablet Take 1 tablet (10 mg total) by mouth daily. 02/11/21 02/11/22  Arta Silence, MD  metoprolol succinate (TOPROL-XL) 50 MG 24 hr tablet Take 1 tablet (50 mg total) by mouth daily. Take with or immediately following a meal. 07/22/19   Loletha Grayer, MD  omeprazole (PRILOSEC) 40 MG capsule Take 1 capsule by mouth daily. 11/11/20   [provider]  pregabalin (LYRICA) 150 MG capsule Take 150 mg by mouth 2 (two) times daily. 10/26/20   [provider]  pregabalin (LYRICA) 75 MG capsule Take 75 mg by mouth 2 (two) times daily. 07/11/20   [provider]  rOPINIRole (REQUIP) 2 MG tablet Take 2 mg by mouth at bedtime. 09/20/19   [provider]  SYMBICORT 80-4.5 MCG/ACT inhaler Inhale 2 puffs into the lungs 2 (two) times daily. 07/12/20   [provider]  traMADol (ULTRAM) 50 MG tablet Take 50 mg by mouth 3 (three) times daily as needed. 10/10/20   [provider]    Allergies Ciprofloxacin, Penicillins, and Cyclopentolate  Family History  Problem Relation Age of Onset   Hypertension Sister    Diabetes Sister    Diabetes Father     Social History Social History   Tobacco Use   Smoking status: Never   Smokeless tobacco: Never  Vaping Use   Vaping Use: Never used  Substance Use Topics   Alcohol use: No   Drug use: No    Review of Systems Constitutional: No fever/chills Eyes: No visual changes. ENT: No sore throat. Cardiovascular: Denies chest pain. Respiratory: Denies shortness of breath. Gastrointestinal: No abdominal pain.  Positive for diarrhea. Genitourinary: Does not urinate.  Musculoskeletal: Negative for back pain. Skin: Negative for rash. Neurological: Positive for lower extremity neuropathy.   ____________________________________________   PHYSICAL EXAM:  VITAL SIGNS: ED Triage Vitals  Enc Vitals Group     BP 02/24/21 1745 (!) 183/99     Pulse  Rate 02/24/21 1745 79     Resp 02/24/21 1745 16     Temp 02/24/21 1745 98 F (36.7 C)     Temp Source 02/24/21 1745 Oral     SpO2 02/24/21 1745 95 %     Weight 02/24/21 1743 232 lb (105.2 kg)     Height 02/24/21 1743 5\' 11"  (1.803 m)     Head Circumference --      Peak Flow --      Pain Score 02/24/21 1742 0   Constitutional: Alert and oriented.  Eyes: Conjunctivae are normal.  ENT      Head: Normocephalic and atraumatic.      Nose: No congestion/rhinnorhea.      Mouth/Throat: Mucous membranes are moist.      Neck: No stridor. Hematological/Lymphatic/Immunilogical: No cervical lymphadenopathy. Cardiovascular: Normal rate, regular rhythm.  No murmurs, rubs, or gallops.  Respiratory: Normal respiratory effort without tachypnea nor retractions. Breath sounds are clear and equal bilaterally. No  wheezes/rales/rhonchi. Gastrointestinal: Soft and non tender. No rebound. No guarding.  Genitourinary: Deferred Musculoskeletal: Normal range of motion in all extremities. No lower extremity edema. Neurologic:  Normal speech and language. No gross focal neurologic deficits are appreciated.  Skin:  Skin is warm, dry and intact. No rash noted. Psychiatric: Mood and affect are normal. Speech and behavior are normal. Patient exhibits appropriate insight and judgment.  ____________________________________________    LABS (pertinent positives/negatives)  Lipase 58 CBC wbc 6.3, hgb 12.3, plt 185 CMP na 138, k 4.6, glu 130, cr 20.43, ca 99.4  ____________________________________________   EKG  I, Nance Pear, attending physician, personally viewed and interpreted this EKG  EKG Time: 1747 Rate: 77 Rhythm: normal sinus rhythm Axis: left axis deviation Intervals: qtc 522 QRS: RBBB, LAFB ST changes: no st elevation Impression: abnormal ekg ____________________________________________    RADIOLOGY  None  ____________________________________________   PROCEDURES  Procedures  ____________________________________________   INITIAL IMPRESSION / ASSESSMENT AND PLAN / ED COURSE  Pertinent labs & imaging results that were available during my care of the patient were reviewed by me and considered in my medical decision making (see chart for details).   Patient presented to the emergency department today with complaint of diarrhea. States he feels it is slowing down. Blood work without any leukocytosis. Potassium is also in normal range. At this time do not feel patient requires any emergent dialysis. No evidence of fluid overload. Patient does have dialysis scheduled for tomorrow. No abdominal tenderness on exam and I do not feel any emergent imaging is necessary at this time.    ____________________________________________   FINAL CLINICAL IMPRESSION(S) / ED  DIAGNOSES  Final diagnoses:  Diarrhea, unspecified type     Note: This dictation was prepared with Dragon dictation. Any transcriptional errors that result from this process are unintentional     Nance Pear, MD 02/24/21 2003

## 2021-03-15 ENCOUNTER — Emergency Department
Admission: EM | Admit: 2021-03-15 | Discharge: 2021-03-15 | Disposition: A | Discharge status: Home / Self Care | Payer: Medicare Other | Attending: Emergency Medicine | Admitting: Emergency Medicine

## 2021-03-15 ENCOUNTER — Emergency Department: Payer: Medicare Other

## 2021-03-15 ENCOUNTER — Other Ambulatory Visit: Payer: Self-pay

## 2021-03-15 ENCOUNTER — Encounter: Payer: Self-pay | Admitting: Emergency Medicine

## 2021-03-15 DIAGNOSIS — E1140 Type 2 diabetes mellitus with diabetic neuropathy, unspecified: Secondary | ICD-10-CM | POA: Diagnosis not present

## 2021-03-15 DIAGNOSIS — R0602 Shortness of breath: Secondary | ICD-10-CM | POA: Diagnosis present

## 2021-03-15 DIAGNOSIS — I2510 Atherosclerotic heart disease of native coronary artery without angina pectoris: Secondary | ICD-10-CM | POA: Diagnosis not present

## 2021-03-15 DIAGNOSIS — R1084 Generalized abdominal pain: Secondary | ICD-10-CM | POA: Diagnosis not present

## 2021-03-15 DIAGNOSIS — I5033 Acute on chronic diastolic (congestive) heart failure: Secondary | ICD-10-CM | POA: Diagnosis not present

## 2021-03-15 DIAGNOSIS — Z992 Dependence on renal dialysis: Secondary | ICD-10-CM | POA: Diagnosis not present

## 2021-03-15 DIAGNOSIS — Z8616 Personal history of COVID-19: Secondary | ICD-10-CM | POA: Insufficient documentation

## 2021-03-15 DIAGNOSIS — Z794 Long term (current) use of insulin: Secondary | ICD-10-CM | POA: Diagnosis not present

## 2021-03-15 DIAGNOSIS — R609 Edema, unspecified: Secondary | ICD-10-CM | POA: Insufficient documentation

## 2021-03-15 DIAGNOSIS — E1122 Type 2 diabetes mellitus with diabetic chronic kidney disease: Secondary | ICD-10-CM | POA: Insufficient documentation

## 2021-03-15 DIAGNOSIS — Z79899 Other long term (current) drug therapy: Secondary | ICD-10-CM | POA: Insufficient documentation

## 2021-03-15 DIAGNOSIS — N186 End stage renal disease: Secondary | ICD-10-CM

## 2021-03-15 DIAGNOSIS — I132 Hypertensive heart and chronic kidney disease with heart failure and with stage 5 chronic kidney disease, or end stage renal disease: Secondary | ICD-10-CM | POA: Diagnosis not present

## 2021-03-15 DIAGNOSIS — Z20822 Contact with and (suspected) exposure to covid-19: Secondary | ICD-10-CM | POA: Insufficient documentation

## 2021-03-15 DIAGNOSIS — Z7982 Long term (current) use of aspirin: Secondary | ICD-10-CM | POA: Insufficient documentation

## 2021-03-15 LAB — COMPREHENSIVE METABOLIC PANEL
ALT: 29 U/L (ref 0–44)
AST: 27 U/L (ref 15–41)
Albumin: 4 g/dL (ref 3.5–5.0)
Alkaline Phosphatase: 117 U/L (ref 38–126)
Anion gap: 16 — ABNORMAL HIGH (ref 5–15)
BUN: 72 mg/dL — ABNORMAL HIGH (ref 8–23)
CO2: 26 mmol/L (ref 22–32)
Calcium: 8.7 mg/dL — ABNORMAL LOW (ref 8.9–10.3)
Chloride: 97 mmol/L — ABNORMAL LOW (ref 98–111)
Creatinine, Ser: 18.16 mg/dL — ABNORMAL HIGH (ref 0.61–1.24)
GFR, Estimated: 3 mL/min — ABNORMAL LOW (ref >60)
Glucose, Bld: 129 mg/dL — ABNORMAL HIGH (ref 70–99)
Potassium: 3.9 mmol/L (ref 3.5–5.1)
Sodium: 139 mmol/L (ref 135–145)
Total Bilirubin: 0.8 mg/dL (ref 0.3–1.2)
Total Protein: 8.3 g/dL — ABNORMAL HIGH (ref 6.5–8.1)

## 2021-03-15 LAB — CBC
HCT: 29.3 % — ABNORMAL LOW (ref 39.0–52.0)
Hemoglobin: 9.8 g/dL — ABNORMAL LOW (ref 13.0–17.0)
MCH: 30.5 pg (ref 26.0–34.0)
MCHC: 33.4 g/dL (ref 30.0–36.0)
MCV: 91.3 fL (ref 80.0–100.0)
Platelets: 227 10*3/uL (ref 150–400)
RBC: 3.21 MIL/uL — ABNORMAL LOW (ref 4.22–5.81)
RDW: 16.5 % — ABNORMAL HIGH (ref 11.5–15.5)
WBC: 5.6 10*3/uL (ref 4.0–10.5)
nRBC: 0 % (ref 0.0–0.2)

## 2021-03-15 LAB — TROPONIN I (HIGH SENSITIVITY)
Troponin I (High Sensitivity): 80 ng/L — ABNORMAL HIGH (ref <18)
Troponin I (High Sensitivity): 86 ng/L — ABNORMAL HIGH (ref <18)

## 2021-03-15 LAB — RESP PANEL BY RT-PCR (FLU A&B, COVID) ARPGX2
Influenza A by PCR: NEGATIVE
Influenza B by PCR: NEGATIVE
SARS Coronavirus 2 by RT PCR: NEGATIVE

## 2021-03-15 LAB — BRAIN NATRIURETIC PEPTIDE: B Natriuretic Peptide: 3093.1 pg/mL — ABNORMAL HIGH (ref 0.0–100.0)

## 2021-03-15 MED ORDER — CLONIDINE HCL 0.1 MG PO TABS
0.1000 mg | ORAL_TABLET | Freq: Once | ORAL | Status: AC
  Administered 2021-03-15: 0.1 mg via ORAL

## 2021-03-15 MED ORDER — MORPHINE SULFATE (PF) 2 MG/ML IV SOLN
1.0000 mg | Freq: Once | INTRAVENOUS | Status: AC
  Administered 2021-03-15: 1 mg via INTRAVENOUS
  Filled 2021-03-15: qty 1

## 2021-03-15 MED ORDER — CLONIDINE HCL 0.1 MG PO TABS
ORAL_TABLET | ORAL | Status: AC
  Filled 2021-03-15: qty 1

## 2021-03-15 MED ORDER — OXYCODONE-ACETAMINOPHEN 5-325 MG PO TABS
2.0000 | ORAL_TABLET | Freq: Once | ORAL | Status: AC
  Administered 2021-03-15: 2 via ORAL
  Filled 2021-03-15: qty 2

## 2021-03-15 MED ORDER — CHLORHEXIDINE GLUCONATE CLOTH 2 % EX PADS
6.0000 | MEDICATED_PAD | Freq: Every day | CUTANEOUS | Status: DC
  Filled 2021-03-15 (×2): qty 6

## 2021-03-15 NOTE — ED Notes (Signed)
Pt in ct 

## 2021-03-15 NOTE — ED Notes (Signed)
Pt returned from ct and back to toilet. Will reconnect pt to monitor when off toilet.

## 2021-03-15 NOTE — ED Notes (Signed)
Pt with bilateral lower leg pain and swelling. Pt states he will be ringing call bell frequently for leg pain. Pt states has missed several dialysis appointments over last month due to leg pain. Pt also complains of nausea.

## 2021-03-15 NOTE — ED Provider Notes (Signed)
[facility] Emergency Department Provider Note  ____________________________________________   None    (approximate)  I have reviewed the triage vital signs and the nursing notes.   HISTORY  Chief Complaint Shortness of Breath    HPI Donald Silva is a 65 y.o. male with past medical history as below including hypertension, hyperlipidemia, diabetes, ESRD, here with multiple complaints.  Patient's primary complaint is chronic neuropathy of his bilateral feet which he states is progressively worsening.  He is on Lyrica for these and has been prescribed tramadol by his primary physician.  He states this is only mildly helping.  He also notes progressively worsening shortness of breath and some mild dyspnea with exertion.  He feels like he has more fluid than usual on him.  He has also had some loose stools for the last several weeks, and has not seen someone for this.  Denies fevers.  Denies persistent abdominal pain.  He states that he was trying to get ready for dialysis today but felt short of breath so he presents for evaluation.  No chest pain.    Past Medical History:  Diagnosis Date   CKD (chronic kidney disease) stage 5, GFR less than 15 ml/min (HCC)    COVID-19    Diabetes mellitus without complication ([place])    Dyspnea    GERD (gastroesophageal reflux disease)    Hypertension    Lower extremity edema    Myocardial infarction [facility])     Patient Active Problem List   Diagnosis Date Noted   Fluid overload 07/24/2020   Acute on chronic respiratory failure with hypoxia ([place]) 07/24/2020   Depression 07/24/2020   CAD (coronary artery disease) 07/24/2020   Hypertensive urgency 07/24/2020   COVID-19 07/26/2019   Hypervolemia    RLS (restless legs syndrome)    Acute and chronic respiratory failure (acute-on-chronic) ([place]) 07/18/2019   Pneumonia due to COVID-19 virus 07/18/2019   Acute respiratory failure ([place]) 06/13/2019   Flash pulmonary edema ([place])     ESRD (end stage renal disease) on dialysis ([place])    Acute on chronic diastolic CHF (congestive heart failure) ([place]) 05/22/2019   Postop check 03/02/2019   Generalized abdominal pain 01/31/2019   Diabetes 1.5, managed as type 2 ([place]) 54/62/7035   Complication of vascular access for dialysis 04/20/2018   Anemia in chronic kidney disease 12/03/2017   Coagulation defect, unspecified ([place]) 12/03/2017   Diarrhea, unspecified 12/03/2017   Encounter for immunization 12/03/2017   Fever, unspecified 12/03/2017   Headache, unspecified 12/03/2017   Iron deficiency anemia, unspecified 12/03/2017   Moderate protein-calorie malnutrition ([place]) 12/03/2017   Pain, unspecified 12/03/2017   Pruritus, unspecified 12/03/2017   Secondary hyperparathyroidism of renal origin ([place]) 12/03/2017   Shortness of breath 12/03/2017   Chronic kidney disease, stage V ([place]) 08/11/2017   Essential hypertension 08/11/2017   Type 2 diabetes mellitus with ESRD (end-stage renal disease) ([place]) 08/11/2017   Hyperkalemia 06/24/2017   Multiple-type hyperlipidemia 03/25/2012   Vitamin D deficiency 03/25/2012   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 Authorizing Provider  albuterol (VENTOLIN HFA) 108 (90 Base) MCG/ACT inhaler Inhale 2 puffs into the lungs every 6 (six) hours as needed for wheezing or shortness of breath. 07/12/20   [provider]   amLODipine (NORVASC) 10 MG tablet Take 1 tablet (10 mg total) by mouth daily. 08/12/19   Carrie Mew, MD  aspirin EC 81 MG tablet Take 81 mg daily by mouth.    [provider]  calcium carbonate (TUMS EX) 750 MG chewable tablet Chew 2 tablets by mouth at bedtime.    [provider]  Cholecalciferol (VITAMIN D) 50 MCG (2000 UT) tablet Take 2,000 Units by mouth daily.    [provider]  cilostazol (PLETAL) 50 MG tablet Take 1 tablet by mouth 2 (two) times daily. 11/11/20 11/11/21  [provider]  Insulin Detemir (LEVEMIR FLEXTOUCH) 100 UNIT/ML Pen Inject 10 Units into the skin at bedtime. 07/22/19   Loletha Grayer, MD  lanthanum (FOSRENOL) 1000 MG chewable tablet Chew 3,000 mg by mouth 3 (three) times daily. 09/13/19   [provider]  lisinopril (ZESTRIL) 40 MG tablet Take 1 tablet (40 mg total) by mouth daily. 08/12/19   Carrie Mew, MD  loperamide (IMODIUM) 2 MG capsule Take 1 capsule (2 mg total) by mouth as needed for diarrhea or loose stools. 07/25/20   Ezekiel Slocumb, DO  loratadine (CLARITIN) 10 MG tablet Take 1 tablet (10 mg total) by mouth daily. 02/11/21 02/11/22  Arta Silence, MD  metoprolol succinate (TOPROL-XL) 50 MG 24 hr tablet Take 1 tablet (50 mg total) by mouth daily. Take with or immediately following a meal. 07/22/19   Loletha Grayer, MD  omeprazole (PRILOSEC) 40 MG capsule Take 1 capsule by mouth daily. 11/11/20   [provider]  pregabalin (LYRICA) 150 MG capsule Take 150 mg by mouth 2 (two) times daily. 10/26/20   [provider]  pregabalin (LYRICA) 75 MG capsule Take 75 mg by mouth 2 (two) times daily. 07/11/20   [provider]  rOPINIRole (REQUIP) 2 MG tablet Take 2 mg by mouth at bedtime. 09/20/19   [provider]  SYMBICORT 80-4.5 MCG/ACT inhaler Inhale 2 puffs into the lungs 2 (two) times daily. 07/12/20   [provider]  traMADol (ULTRAM) 50 MG tablet Take 50 mg by  mouth 3 (three) times daily as needed. 10/10/20   [provider]    Allergies Ciprofloxacin, Penicillins, and Cyclopentolate  Family History  Problem Relation Age of Onset   Hypertension Sister    Diabetes Sister    Diabetes Father     Social History Social History   Tobacco Use   Smoking status: Never   Smokeless tobacco: Never  Vaping Use   Vaping Use: Never used  Substance Use Topics   Alcohol use: No   Drug use: No    Review of Systems  Review of Systems  Constitutional:  Positive for fatigue. Negative for chills and fever.  HENT:  Negative for sore throat.   Respiratory:  Positive for shortness of breath.   Cardiovascular:  Negative for chest pain.  Gastrointestinal:  Positive for diarrhea. Negative for abdominal pain.  Genitourinary:  Negative for flank pain.  Musculoskeletal:  Negative for neck pain.  Skin:  Negative for rash and wound.  Allergic/Immunologic: Negative for immunocompromised state.  Neurological:  Positive for weakness. Negative for numbness.  Hematological:  Does  not bruise/bleed easily.  All other systems reviewed and are negative.   ____________________________________________  PHYSICAL EXAM:      VITAL SIGNS: ED Triage Vitals  Enc Vitals Group     BP 03/15/21 0505 (!) 195/91     Pulse Rate 03/15/21 0505 80     Resp 03/15/21 0505 20     Temp 03/15/21 0505 99 F (37.2 C)     Temp Source 03/15/21 0505 Oral     SpO2 03/15/21 0505 93 %     Weight 03/15/21 0507 226 lb (102.5 kg)     Height 03/15/21 0507 5\' 9"  (1.753 m)     Head Circumference --      Peak Flow --      Pain Score 03/15/21 0507 9     Pain Loc --      Pain Edu? --      Excl. in [place]? --      Physical Exam Pulmonary:     Breath sounds: Examination of the right-lower field reveals rales. Examination of the left-lower field reveals rales. Rales present.  Musculoskeletal:     Right lower leg: Edema present.     Left lower leg: Edema present.       ____________________________________________   LABS (all labs ordered are listed, but only abnormal results are displayed)  Labs Reviewed  CBC - Abnormal; Notable for the following components:      Result Value   RBC 3.21 (*)    Hemoglobin 9.8 (*)    HCT 29.3 (*)    RDW 16.5 (*)    All other components within normal limits  COMPREHENSIVE METABOLIC PANEL - Abnormal; Notable for the following components:   Chloride 97 (*)    Glucose, Bld 129 (*)    BUN 72 (*)    Creatinine, Ser 18.16 (*)    Calcium 8.7 (*)    Total Protein 8.3 (*)    GFR, Estimated 3 (*)    Anion gap 16 (*)    All other components within normal limits  BRAIN NATRIURETIC PEPTIDE - Abnormal; Notable for the following components:   B Natriuretic Peptide 3,093.1 (*)    All other components within normal limits  TROPONIN I (HIGH SENSITIVITY) - Abnormal; Notable for the following components:   Troponin I (High Sensitivity) 80 (*)    All other components within normal limits  RESP PANEL BY RT-PCR (FLU A&B, COVID) ARPGX2  TROPONIN I (HIGH SENSITIVITY)    ____________________________________________  EKG: Normal sinus rhythm, ventricular rate 81.  PR 186, QRS 164, QTc 532.  Prolonged QT.  Bifascicular block.  No acute ST elevations when compared to prior. ________________________________________  RADIOLOGY All imaging, including plain films, CT scans, and ultrasounds, independently reviewed by me, and interpretations confirmed via formal radiology reads.  ED MD interpretation:   Chest x-ray: Chronic edema  Official radiology report(s): CT ABDOMEN PELVIS WO CONTRAST  Result Date: 03/15/2021 CLINICAL DATA:  65 year old male with history of acute abdominal pain. EXAM: CT ABDOMEN AND PELVIS WITHOUT CONTRAST TECHNIQUE: Multidetector CT imaging of the abdomen and pelvis was performed following the standard protocol without IV contrast. COMPARISON:  CT the abdomen and pelvis 07/09/2006. FINDINGS: Lower chest:  Unremarkable. Hepatobiliary: No definite suspicious cystic or solid hepatic lesions are confidently identified on today's noncontrast CT examination. Tiny calcified granulomas in the liver. Status post cholecystectomy. Pancreas: No definite pancreatic mass or peripancreatic fluid collections or inflammatory changes are noted on today's noncontrast CT examination. Spleen: Unremarkable. Adrenals/Urinary Tract: Mild  bilateral renal atrophy. Unenhanced appearance of the kidneys and bilateral adrenal glands is otherwise normal. No hydroureteronephrosis. Urinary bladder is nearly completely decompressed, but otherwise unremarkable in appearance. Stomach/Bowel: The unenhanced appearance of the stomach is normal. No pathologic dilatation of small bowel or colon. Normal appendix. Vascular/Lymphatic: Aortic atherosclerosis. Enlarged inguinal lymph nodes bilaterally measuring up to 1.7 cm on the right side (axial image 94 of series 2). No other lymphadenopathy noted in the abdomen or pelvis. Reproductive: Prostate gland and seminal vesicles are unremarkable in appearance. Other: Trace volume of ascites.  No pneumoperitoneum. Musculoskeletal: There are no aggressive appearing lytic or blastic lesions noted in the visualized portions of the skeleton. IMPRESSION: 1. Trace volume of ascites. No other acute findings are noted elsewhere in the abdomen or pelvis to account for the patient's symptoms. 2. Mildly enlarged inguinal lymph nodes bilaterally. This is nonspecific. 3. Aortic atherosclerosis. 4. Additional incidental findings, as above. Electronically Signed   By: Vinnie Langton M.D.   On: 03/15/2021 07:09   DG Chest Portable 1 View  Result Date: 03/15/2021 CLINICAL DATA:  Shortness of breath EXAM: PORTABLE CHEST 1 VIEW COMPARISON:  02/11/2021 FINDINGS: Cardiomegaly. Low volume chest with diffuse interstitial coarsening and hazy density, suspect chronic edema. Left subclavian stenting. No visible effusion or pneumothorax.  IMPRESSION: Chronic cardiomegaly and pulmonary opacity that is stable compared to priors. Suspect chronic edema. Electronically Signed   By: Monte Fantasia M.D.   On: 03/15/2021 05:40    ____________________________________________  PROCEDURES   Procedure(s) performed (including Critical Care):  Procedures  ____________________________________________  INITIAL IMPRESSION / MDM / [place] / ED COURSE  As part of my medical decision making, I reviewed the following data within the [place] notes reviewed and incorporated, Old chart reviewed, Notes from prior ED visits, and [place] Controlled Substance Database       *Donald Silva was evaluated in Emergency Department on 03/15/2021 for the symptoms described in the history of present illness. He was evaluated in the context of the global COVID-19 pandemic, which necessitated consideration that the patient might be at risk for infection with the SARS-CoV-2 virus that causes COVID-19. Institutional protocols and algorithms that pertain to the evaluation of patients at risk for COVID-19 are in a state of rapid change based on information released by regulatory bodies including the CDC and federal and state organizations. These policies and algorithms were followed during the patient's care in the ED.  Some ED evaluations and interventions may be delayed as a result of limited staffing during the pandemic.*     Medical Decision Making: 65 year old male here with multiple complaints.  Regarding shortness of breath, I suspect he has likely acute on chronic edema and hypervolemia in the setting of end-stage renal disease.  He does appear mildly edematous, with rales and elevated BNP compared to baseline.  Troponin mildly increased above baseline, but he has no chest pain.  EKG shows baseline bifascicular block without ST elevations.  Otherwise, regarding his diarrhea, will send for CT as he has had some symptoms for  several weeks, to evaluate for diverticulitis.  Lab work is otherwise overall reassuring and near his baseline.  Will plan to discuss with nephrology regarding need for dialysis today.  ____________________________________________  FINAL CLINICAL IMPRESSION(S) / ED DIAGNOSES  Final diagnoses:  ESRD (end stage renal disease) ([place])  Diabetic neuropathy, painful ([place])     MEDICATIONS GIVEN DURING THIS VISIT:  Medications  oxyCODONE-acetaminophen (PERCOCET/ROXICET) 5-325 MG per tablet 2  tablet (2 tablets Oral Given 03/15/21 0610)     ED Discharge Orders     None        Note:  This document was prepared using Dragon voice recognition software and may include unintentional dictation errors.   Duffy Bruce, MD 03/15/21 [phone]

## 2021-03-15 NOTE — ED Notes (Signed)
Pt requesting to not be on heart monitor.

## 2021-03-15 NOTE — ED Notes (Signed)
Pt up to commode to have bowel movement.  

## 2021-03-15 NOTE — ED Triage Notes (Signed)
Pt to ED from home c/o SOB that started tonight when oxygen ran out at home.  States wears chronic 2-3L [place].  T, TH, Sat dialysis and last went Thursday, supposed to go today.  Pt also c/o bilateral leg pain with hx of neuropathy.  Pt has fistula in left arm.  Pt presented to ED without oxygen, placed on 3L in triage room and oxygen sat increased from 81% to 95%, states breathing easier.

## 2021-03-15 NOTE — ED Notes (Signed)
Report to morgan, rn 

## 2021-03-15 NOTE — Progress Notes (Signed)
[facility]  ROUNDING NOTE   Subjective:   Donald Silva is a 65 y.o. male with past medical history of diabetes, GERD, hypertension, and ESRD on dialysis. He presented to the ED chronic neuropathy pain that has progressively gotten worse.   We have been consulted to provide dialysis treatment.  Patient sitting in chair sleeping States his feet hurt when he is on the machine.  Takes prescribed pain medications with little to no relief.      Objective:  Vital signs in last 24 hours:  Temp:  [99 F (37.2 C)] 99 F (37.2 C) (08/06 0505) Pulse Rate:  [78-80] 78 (08/06 0741) Resp:  [18-22] 18 (08/06 0741) BP: (195-197)/(91-100) 197/100 (08/06 0741) SpO2:  [93 %-98 %] 98 % (08/06 0741) Weight:  [102.5 kg] 102.5 kg (08/06 0507)  Weight change:  Filed Weights   03/15/21 0507  Weight: 102.5 kg    Intake/Output: No intake/output data recorded.   Intake/Output this shift:  No intake/output data recorded.  Physical Exam: General: NAD, sitting in chair  Head: Normocephalic, atraumatic. Moist oral mucosal membranes  Eyes: Anicteric,  Lungs:  Clear to auscultation  Heart: Regular rate and rhythm  Abdomen:  Soft, nontender,   Extremities:  1+ peripheral edema.  Neurologic: Nonfocal, moving all four extremities  Skin: No lesions  Access: Lt AVF    Basic Metabolic Panel: Recent Labs  Lab 03/15/21 0523  NA 139  K 3.9  CL 97*  CO2 26  GLUCOSE 129*  BUN 72*  CREATININE 18.16*  CALCIUM 8.7*    Liver Function Tests: Recent Labs  Lab 03/15/21 0523  AST 27  ALT 29  ALKPHOS 117  BILITOT 0.8  PROT 8.3*  ALBUMIN 4.0   No results for input(s): LIPASE, AMYLASE in the last 168 hours. No results for input(s): AMMONIA in the last 168 hours.  CBC: Recent Labs  Lab 03/15/21 0523  WBC 5.6  HGB 9.8*  HCT 29.3*  MCV 91.3  PLT 227    Cardiac Enzymes: No results for input(s): CKTOTAL, CKMB, CKMBINDEX, TROPONINI in the last 168 hours.  BNP: Invalid  input(s): POCBNP  CBG: No results for input(s): GLUCAP in the last 168 hours.  Microbiology: Results for orders placed or performed during the hospital encounter of 03/15/21  Resp Panel by RT-PCR (Flu A&B, Covid)     Status: None   Collection Time: 03/15/21  7:15 AM  Result Value Ref Range Status   SARS Coronavirus 2 by RT PCR NEGATIVE NEGATIVE Final    Comment: (NOTE) SARS-CoV-2 target nucleic acids are NOT DETECTED.  The SARS-CoV-2 RNA is generally detectable in upper respiratory specimens during the acute phase of infection. The lowest concentration of SARS-CoV-2 viral copies this assay can detect is 138 copies/mL. A negative result does not preclude SARS-Cov-2 infection and should not be used as the sole basis for treatment or other patient management decisions. A negative result may occur with  improper specimen collection/handling, submission of specimen other than nasopharyngeal swab, presence of viral mutation(s) within the areas targeted by this assay, and inadequate number of viral copies(<138 copies/mL). A negative result must be combined with clinical observations, patient history, and epidemiological information. The expected result is Negative.  Fact Sheet for Patients:  EntrepreneurPulse.com.au  Fact Sheet for Healthcare Providers:  IncredibleEmployment.be  This test is no t yet approved or cleared by the [place] FDA and  has been authorized for detection and/or diagnosis of SARS-CoV-2 by FDA under an Emergency Use  Authorization (EUA). This EUA will remain  in effect (meaning this test can be used) for the duration of the COVID-19 declaration under Section 564(b)(1) of the Act, 21 U.S.C.section 360bbb-3(b)(1), unless the authorization is terminated  or revoked sooner.       Influenza A by PCR NEGATIVE NEGATIVE Final   Influenza B by PCR NEGATIVE NEGATIVE Final    Comment: (NOTE) The Xpert Xpress SARS-CoV-2/FLU/RSV  plus assay is intended as an aid in the diagnosis of influenza from Nasopharyngeal swab specimens and should not be used as a sole basis for treatment. Nasal washings and aspirates are unacceptable for Xpert Xpress SARS-CoV-2/FLU/RSV testing.  Fact Sheet for Patients: EntrepreneurPulse.com.au  Fact Sheet for Healthcare Providers: IncredibleEmployment.be  This test is not yet approved or cleared by the [place] FDA and has been authorized for detection and/or diagnosis of SARS-CoV-2 by FDA under an Emergency Use Authorization (EUA). This EUA will remain in effect (meaning this test can be used) for the duration of the COVID-19 declaration under Section 564(b)(1) of the Act, 21 U.S.C. section 360bbb-3(b)(1), unless the authorization is terminated or revoked.  Performed at [facility], [address] [place],  [address]     Coagulation Studies: No results for input(s): LABPROT, INR in the last 72 hours.  Urinalysis: No results for input(s): COLORURINE, LABSPEC, PHURINE, GLUCOSEU, HGBUR, BILIRUBINUR, KETONESUR, PROTEINUR, UROBILINOGEN, NITRITE, LEUKOCYTESUR in the last 72 hours.  Invalid input(s): APPERANCEUR    Imaging: CT ABDOMEN PELVIS WO CONTRAST  Result Date: 03/15/2021 CLINICAL DATA:  65 year old male with history of acute abdominal pain. EXAM: CT ABDOMEN AND PELVIS WITHOUT CONTRAST TECHNIQUE: Multidetector CT imaging of the abdomen and pelvis was performed following the standard protocol without IV contrast. COMPARISON:  CT the abdomen and pelvis 07/09/2006. FINDINGS: Lower chest: Unremarkable. Hepatobiliary: No definite suspicious cystic or solid hepatic lesions are confidently identified on today's noncontrast CT examination. Tiny calcified granulomas in the liver. Status post cholecystectomy. Pancreas: No definite pancreatic mass or peripancreatic fluid collections or inflammatory changes are noted on today's  noncontrast CT examination. Spleen: Unremarkable. Adrenals/Urinary Tract: Mild bilateral renal atrophy. Unenhanced appearance of the kidneys and bilateral adrenal glands is otherwise normal. No hydroureteronephrosis. Urinary bladder is nearly completely decompressed, but otherwise unremarkable in appearance. Stomach/Bowel: The unenhanced appearance of the stomach is normal. No pathologic dilatation of small bowel or colon. Normal appendix. Vascular/Lymphatic: Aortic atherosclerosis. Enlarged inguinal lymph nodes bilaterally measuring up to 1.7 cm on the right side (axial image 94 of series 2). No other lymphadenopathy noted in the abdomen or pelvis. Reproductive: Prostate gland and seminal vesicles are unremarkable in appearance. Other: Trace volume of ascites.  No pneumoperitoneum. Musculoskeletal: There are no aggressive appearing lytic or blastic lesions noted in the visualized portions of the skeleton. IMPRESSION: 1. Trace volume of ascites. No other acute findings are noted elsewhere in the abdomen or pelvis to account for the patient's symptoms. 2. Mildly enlarged inguinal lymph nodes bilaterally. This is nonspecific. 3. Aortic atherosclerosis. 4. Additional incidental findings, as above. Electronically Signed   By: Vinnie Langton M.D.   On: 03/15/2021 07:09   DG Chest Portable 1 View  Result Date: 03/15/2021 CLINICAL DATA:  Shortness of breath EXAM: PORTABLE CHEST 1 VIEW COMPARISON:  02/11/2021 FINDINGS: Cardiomegaly. Low volume chest with diffuse interstitial coarsening and hazy density, suspect chronic edema. Left subclavian stenting. No visible effusion or pneumothorax. IMPRESSION: Chronic cardiomegaly and pulmonary opacity that is stable compared to priors. Suspect chronic edema. Electronically Signed   By: Angelica Chessman  Watts M.D.   On: 03/15/2021 05:40     Medications:     Chlorhexidine Gluconate Cloth  6 each Topical Q0600     Assessment/ Plan:  Donald Silva is a 65 y.o.  male with  past medical history of diabetes, GERD, hypertension, and ESRD on dialysis. He presented to the ED chronic neuropathy pain that has progressively gotten worse.   End stage renal disease on dialysis: TTS Scheduled for dialysis today, UF goal 2L as tolerated.   2. Anemia of chronic kidney disease Lab Results  Component Value Date   HGB 9.8 (L) 03/15/2021    Hgb at target  3. Secondary Hyperparathyroidism:  Lab Results  Component Value Date   CALCIUM 8.7 (L) 03/15/2021   PHOS 7.8 (H) 02/11/2021   Calcium and phosphorus not at goal Cinacalcet and Vitamin D ordered outpatient  4. Hypertension during dialysis No morning BP medications taken Will give Clonidine 0.1mg  once     LOS: 0   8/6/20228:52 AM

## 2021-03-15 NOTE — ED Provider Notes (Signed)
Patient received in signout.  Remains stable and feels much improved after dialysis.  Plan was for discharge after dialysis.  Patient does appear stable appropriate for outpatient follow-up.   Merlyn Lot, MD 03/15/21 [phone]

## 2021-03-28 ENCOUNTER — Emergency Department: Payer: Medicare Other

## 2021-03-28 ENCOUNTER — Other Ambulatory Visit: Payer: Self-pay

## 2021-03-28 ENCOUNTER — Emergency Department
Admission: EM | Admit: 2021-03-28 | Discharge: 2021-03-28 | Disposition: A | Discharge status: Home / Self Care | Payer: Medicare Other | Attending: Emergency Medicine | Admitting: Emergency Medicine

## 2021-03-28 ENCOUNTER — Ambulatory Visit (INDEPENDENT_AMBULATORY_CARE_PROVIDER_SITE_OTHER): Payer: Medicare Other | Admitting: Podiatry

## 2021-03-28 ENCOUNTER — Encounter: Payer: Self-pay | Admitting: Emergency Medicine

## 2021-03-28 DIAGNOSIS — I5033 Acute on chronic diastolic (congestive) heart failure: Secondary | ICD-10-CM | POA: Insufficient documentation

## 2021-03-28 DIAGNOSIS — Z8616 Personal history of COVID-19: Secondary | ICD-10-CM | POA: Diagnosis not present

## 2021-03-28 DIAGNOSIS — L989 Disorder of the skin and subcutaneous tissue, unspecified: Secondary | ICD-10-CM | POA: Diagnosis not present

## 2021-03-28 DIAGNOSIS — E1122 Type 2 diabetes mellitus with diabetic chronic kidney disease: Secondary | ICD-10-CM | POA: Diagnosis not present

## 2021-03-28 DIAGNOSIS — M79674 Pain in right toe(s): Secondary | ICD-10-CM | POA: Diagnosis not present

## 2021-03-28 DIAGNOSIS — Z794 Long term (current) use of insulin: Secondary | ICD-10-CM | POA: Insufficient documentation

## 2021-03-28 DIAGNOSIS — B351 Tinea unguium: Secondary | ICD-10-CM

## 2021-03-28 DIAGNOSIS — Z79899 Other long term (current) drug therapy: Secondary | ICD-10-CM | POA: Insufficient documentation

## 2021-03-28 DIAGNOSIS — N186 End stage renal disease: Secondary | ICD-10-CM

## 2021-03-28 DIAGNOSIS — E0843 Diabetes mellitus due to underlying condition with diabetic autonomic (poly)neuropathy: Secondary | ICD-10-CM | POA: Diagnosis not present

## 2021-03-28 DIAGNOSIS — R0602 Shortness of breath: Secondary | ICD-10-CM | POA: Diagnosis not present

## 2021-03-28 DIAGNOSIS — M79675 Pain in left toe(s): Secondary | ICD-10-CM | POA: Diagnosis not present

## 2021-03-28 DIAGNOSIS — Z992 Dependence on renal dialysis: Secondary | ICD-10-CM | POA: Diagnosis not present

## 2021-03-28 DIAGNOSIS — Z7982 Long term (current) use of aspirin: Secondary | ICD-10-CM | POA: Diagnosis not present

## 2021-03-28 DIAGNOSIS — M7989 Other specified soft tissue disorders: Secondary | ICD-10-CM | POA: Diagnosis present

## 2021-03-28 DIAGNOSIS — R600 Localized edema: Secondary | ICD-10-CM | POA: Insufficient documentation

## 2021-03-28 DIAGNOSIS — I132 Hypertensive heart and chronic kidney disease with heart failure and with stage 5 chronic kidney disease, or end stage renal disease: Secondary | ICD-10-CM | POA: Diagnosis not present

## 2021-03-28 DIAGNOSIS — M792 Neuralgia and neuritis, unspecified: Secondary | ICD-10-CM

## 2021-03-28 DIAGNOSIS — R609 Edema, unspecified: Secondary | ICD-10-CM

## 2021-03-28 LAB — CBC
HCT: 31.2 % — ABNORMAL LOW (ref 39.0–52.0)
Hemoglobin: 10.3 g/dL — ABNORMAL LOW (ref 13.0–17.0)
MCH: 31.9 pg (ref 26.0–34.0)
MCHC: 33 g/dL (ref 30.0–36.0)
MCV: 96.6 fL (ref 80.0–100.0)
Platelets: 174 10*3/uL (ref 150–400)
RBC: 3.23 MIL/uL — ABNORMAL LOW (ref 4.22–5.81)
RDW: 19.4 % — ABNORMAL HIGH (ref 11.5–15.5)
WBC: 6.9 10*3/uL (ref 4.0–10.5)
nRBC: 0 % (ref 0.0–0.2)

## 2021-03-28 LAB — BASIC METABOLIC PANEL
Anion gap: 12 (ref 5–15)
BUN: 63 mg/dL — ABNORMAL HIGH (ref 8–23)
CO2: 30 mmol/L (ref 22–32)
Calcium: 9.1 mg/dL (ref 8.9–10.3)
Chloride: 96 mmol/L — ABNORMAL LOW (ref 98–111)
Creatinine, Ser: 12.19 mg/dL — ABNORMAL HIGH (ref 0.61–1.24)
GFR, Estimated: 4 mL/min — ABNORMAL LOW (ref >60)
Glucose, Bld: 112 mg/dL — ABNORMAL HIGH (ref 70–99)
Potassium: 4.3 mmol/L (ref 3.5–5.1)
Sodium: 138 mmol/L (ref 135–145)

## 2021-03-28 MED ORDER — GABAPENTIN 100 MG PO CAPS
200.0000 mg | ORAL_CAPSULE | Freq: Once | ORAL | Status: AC
  Administered 2021-03-28: 200 mg via ORAL
  Filled 2021-03-28: qty 2

## 2021-03-28 MED ORDER — ACETAMINOPHEN 500 MG PO TABS
1000.0000 mg | ORAL_TABLET | Freq: Once | ORAL | Status: AC
  Administered 2021-03-28: 1000 mg via ORAL
  Filled 2021-03-28: qty 2

## 2021-03-28 NOTE — ED Notes (Signed)
Pt updated after asking about wait times.

## 2021-03-28 NOTE — ED Triage Notes (Signed)
Pt comes from [facility] with c/o bilateral leg swelling, SOB and cough. Pt arrived and O2 was at 81% unknown if on any oxygen at the time of that reading. Pt wears 2-3 L at home. Pt has o2 tank but it was beeping low.

## 2021-03-28 NOTE — Progress Notes (Signed)
    Subjective: Patient is a 65 y.o. male presenting to the office today with a chief complaint of painful callus lesion(s) noted to the bilateral feet. Walking and bearing weight increases the pain.  Patient complains of elongated, thickened nails that cause pain while ambulating in shoes. He is unable to trim his own nails. Patient presents today for further treatment and evaluation.  Past Medical History:  Diagnosis Date   CKD (chronic kidney disease) stage 5, GFR less than 15 ml/min (HCC)    COVID-19    Diabetes mellitus without complication (HCC)    Dyspnea    GERD (gastroesophageal reflux disease)    Hypertension    Lower extremity edema    Myocardial infarction ([place])     Objective:  Physical Exam General: Alert and oriented x3 in no acute distress  Dermatology: Hyperkeratotic lesion(s) present on the bilateral feet. Pain on palpation with a central nucleated core noted. Skin is warm, dry and supple bilateral lower extremities. Negative for open lesions or macerations. Nails are tender, long, thickened and dystrophic with subungual debris, consistent with onychomycosis, 1-5 left, 2-4 right. No signs of infection noted.  Vascular: Palpable pedal pulses bilaterally. No edema or erythema noted. Capillary refill within normal limits.  Neurological: Epicritic and protective threshold diminished bilaterally.   Musculoskeletal Exam: Pain on palpation at the keratotic lesion(s) noted. 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 Onychodystrophic nails 1-5 bilateral with hyperkeratosis of nails.  2. Onychomycosis of nail due to dermatophyte bilateral 3. Pre-ulcerative callus lesions noted to the bilateral feet x 4 4. 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 Patient evaluated. 2. Excisional debridement of keratoic lesion(s) using a chisel blade was performed without incident.  3. Dressed with light dressing. 4. Mechanical debridement of nails 1-5  bilaterally performed using a nail nipper. Filed with dremel without incident.  5.  Continue diabetic shoes 6.  Appointment with Pedorthist for diabetic shoe fitting  7.  Patient is to return to the clinic in 3 months.   Edrick Kins, DPM [facility]  Dr. Edrick Kins, DPM    [address],  [address]                Office [phone]  Fax [phone]

## 2021-03-28 NOTE — Discharge Instructions (Addendum)
Finish all of your dialysis sessions that this will help with the swelling of your legs.  Continue taking your Lyrica and use Tylenol as well. Use Tylenol for pain and fevers.  Up to 1000 mg per dose, up to 4 times per day.  Do not take more than 4000 mg of Tylenol/acetaminophen within 24 hours..  Return to the ED with any worsening symptoms despite these measures.

## 2021-03-28 NOTE — ED Provider Notes (Signed)
[facility] Emergency Department Provider Note ____________________________________________   Event Date/Time   First MD Initiated Contact with Patient 03/28/21 2023     (approximate)  I have reviewed the triage vital signs and the nursing notes.  HISTORY  Chief Complaint Shortness of Breath and Leg Swelling   HPI Donald Silva is a 65 y.o. malewho presents to the ED for evaluation of leg swelling and pain.  Chart review indicates history of ESRD TThS, HTN, HLD and DM.  Chronic respiratory failure on 2-3 L home O2.  Recent spirometry with pulmonary consistent with moderate restrictive disease without significant bronchodilator effect.  Patient presents to the ED for evaluation of 4-5 weeks of bilateral leg swelling and associated aching and burning pain that he relates to his known peripheral neuropathy.  Patient is requesting medications to help with his neuropathy.  He reports adherence to his Lyrica and asks if it is safe to use Tylenol with this.  He reports compliance with dialysis, but did not quite finish the session this past Thursday, yesterday, due to the burning pain to his bilateral feet.  While he was triaged as being short of breath, he reports his breathing is fine and actually feels pretty good.  He was noted to be hypoxic on room air when his portable oxygen tank was empty, and once put back on wall oxygen to his baseline 2 L nasal cannula, he reports he feels fine and has had no hypoxia.  He denies fevers, cough, syncopal episodes, chest pain, abdominal pain or emesis.  I discussed with him multiple times the possibility of acute DVT to his legs causing his pain and swelling, but he refuses ultrasound as he thinks his legs probably do not have a blood clot.  Past Medical History:  Diagnosis Date   CKD (chronic kidney disease) stage 5, GFR less than 15 ml/min (HCC)    COVID-19    Diabetes mellitus without complication (HCC)    Dyspnea     GERD (gastroesophageal reflux disease)    Hypertension    Lower extremity edema    Myocardial infarction [facility])     Patient Active Problem List   Diagnosis Date Noted   Fluid overload 07/24/2020   Acute on chronic respiratory failure with hypoxia ([place]) 07/24/2020   Depression 07/24/2020   CAD (coronary artery disease) 07/24/2020   Hypertensive urgency 07/24/2020   COVID-19 07/26/2019   Hypervolemia    RLS (restless legs syndrome)    Acute and chronic respiratory failure (acute-on-chronic) ([place]) 07/18/2019   Pneumonia due to COVID-19 virus 07/18/2019   Acute respiratory failure ([place]) 06/13/2019   Flash pulmonary edema ([place])    ESRD (end stage renal disease) on dialysis ([place])    Acute on chronic diastolic CHF (congestive heart failure) ([place]) 05/22/2019   Postop check 03/02/2019   Generalized abdominal pain 01/31/2019   Diabetes 1.5, managed as type 2 ([place]) 93/23/5573   Complication of vascular access for dialysis 04/20/2018   Anemia in chronic kidney disease 12/03/2017   Coagulation defect, unspecified ([place]) 12/03/2017   Diarrhea, unspecified 12/03/2017   Encounter for immunization 12/03/2017   Fever, unspecified 12/03/2017   Headache, unspecified 12/03/2017   Iron deficiency anemia, unspecified 12/03/2017   Moderate protein-calorie malnutrition ([place]) 12/03/2017   Pain, unspecified 12/03/2017   Pruritus, unspecified 12/03/2017   Secondary hyperparathyroidism of renal origin ([place]) 12/03/2017   Shortness of breath 12/03/2017   Chronic kidney disease, stage V ([place]) 08/11/2017   Essential hypertension 08/11/2017  Type 2 diabetes mellitus with ESRD (end-stage renal disease) ([place]) 08/11/2017   Hyperkalemia 06/24/2017   Multiple-type hyperlipidemia 03/25/2012   Vitamin D deficiency 03/25/2012   ED (erectile dysfunction) of organic origin 09/14/2011    Past Surgical History:  Procedure Laterality Date   A/V FISTULAGRAM N/A 05/03/2018   Procedure: A/V FISTULAGRAM;  Surgeon:  Katha Cabal, MD;  Location: [facility];  Service: Cardiovascular;  Laterality: N/A;   AV FISTULA PLACEMENT Left 08/27/2017   Procedure: ARTERIOVENOUS (AV) FISTULA CREATION ( BRACHIAL CEPHALIC );  Surgeon: Katha Cabal, MD;  Location: [facility] ORS;  Service: Vascular;  Laterality: Left;   CHOLECYSTECTOMY     TOE SURGERY Right     Prior to Admission medications   Medication Sig Start Date End Date Taking? Authorizing Provider  albuterol (VENTOLIN HFA) 108 (90 Base) MCG/ACT inhaler Inhale 2 puffs into the lungs every 6 (six) hours as needed for wheezing or shortness of breath. 07/12/20   [provider]  amLODipine (NORVASC) 10 MG tablet Take 1 tablet (10 mg total) by mouth daily. 08/12/19   Carrie Mew, MD  aspirin EC 81 MG tablet Take 81 mg daily by mouth.    [provider]  calcium carbonate (TUMS EX) 750 MG chewable tablet Chew 2 tablets by mouth at bedtime.    [provider]  Cholecalciferol (VITAMIN D) 50 MCG (2000 UT) tablet Take 2,000 Units by mouth daily.    [provider]  cilostazol (PLETAL) 50 MG tablet Take 1 tablet by mouth 2 (two) times daily. 11/11/20 11/11/21  [provider]  Insulin Detemir (LEVEMIR FLEXTOUCH) 100 UNIT/ML Pen Inject 10 Units into the skin at bedtime. 07/22/19   Loletha Grayer, MD  lanthanum (FOSRENOL) 1000 MG chewable tablet Chew 3,000 mg by mouth 3 (three) times daily. 09/13/19   [provider]  lisinopril (ZESTRIL) 40 MG tablet Take 1 tablet (40 mg total) by mouth daily. 08/12/19   Carrie Mew, MD  loperamide (IMODIUM) 2 MG capsule Take 1 capsule (2 mg total) by mouth as needed for diarrhea or loose stools. 07/25/20   Ezekiel Slocumb, DO  loratadine (CLARITIN) 10 MG tablet Take 1 tablet (10 mg total) by mouth daily. 02/11/21 02/11/22  Arta Silence, MD  metoprolol succinate (TOPROL-XL) 50 MG 24 hr tablet Take 1 tablet (50 mg total) by mouth daily. Take with or immediately  following a meal. 07/22/19   Loletha Grayer, MD  omeprazole (PRILOSEC) 40 MG capsule Take 1 capsule by mouth daily. 11/11/20   [provider]  pregabalin (LYRICA) 150 MG capsule Take 150 mg by mouth 2 (two) times daily. 10/26/20   [provider]  pregabalin (LYRICA) 75 MG capsule Take 75 mg by mouth 2 (two) times daily. 07/11/20   [provider]  rOPINIRole (REQUIP) 2 MG tablet Take 2 mg by mouth at bedtime. 09/20/19   [provider]  SYMBICORT 80-4.5 MCG/ACT inhaler Inhale 2 puffs into the lungs 2 (two) times daily. 07/12/20   [provider]  traMADol (ULTRAM) 50 MG tablet Take 50 mg by mouth 3 (three) times daily as needed. 10/10/20   [provider]    Allergies Ciprofloxacin, Penicillins, and Cyclopentolate  Family History  Problem Relation Age of Onset   Hypertension Sister    Diabetes Sister    Diabetes Father     Social History Social History   Tobacco Use   Smoking status: Never   Smokeless tobacco: Never  Vaping Use  Vaping Use: Never used  Substance Use Topics   Alcohol use: No   Drug use: No    Review of Systems  Constitutional: No fever/chills Eyes: No visual changes. ENT: No sore throat. Cardiovascular: Denies chest pain. Respiratory: Denies shortness of breath. Gastrointestinal: No abdominal pain.  No nausea, no vomiting.  No diarrhea.  No constipation. Genitourinary: Negative for dysuria. Musculoskeletal: Negative for back pain. Positive atraumatic bilateral lower extremity pain and neuropathy. positive for swelling to bilateral legs Skin: Negative for rash. Neurological: Negative for headaches, focal weakness or numbness.  ____________________________________________   PHYSICAL EXAM:  VITAL SIGNS: Vitals:   03/28/21 2030 03/28/21 2040  BP:  (!) 208/104  Pulse: 86 85  Resp: 18 20  Temp:    SpO2: 97% 97%     Constitutional: Alert and oriented. Well appearing and in no acute distress.   Sitting up in the side of the bed, pleasant and conversational in full sentences. Eyes: Conjunctivae are normal. PERRL. EOMI. Head: Atraumatic. Nose: No congestion/rhinnorhea. Mouth/Throat: Mucous membranes are moist.  Oropharynx non-erythematous. Neck: No stridor. No cervical spine tenderness to palpation. Cardiovascular: Normal rate, regular rhythm. Grossly normal heart sounds.  Good peripheral circulation. Respiratory: Normal respiratory effort.  No retractions. Lungs CTAB.  No tachypnea or hypoxia associated with 2 L supplemental O2. Gastrointestinal: Soft , nondistended, nontender to palpation. No CVA tenderness. Musculoskeletal:  No joint effusions. No signs of acute trauma. Symmetric pitting edema to bilateral lower extremities up to the mid shins without overlying skin changes or signs of trauma.  Calf is soft bilaterally without tenderness, no popliteal tenderness or thigh tenderness.  No overlying skin changes throughout the legs. AV fistula to left upper extremity with palpable thrill and LUE is distally neurovascularly intact. Neurologic:  Normal speech and language. No gross focal neurologic deficits are appreciated.  Skin:  Skin is warm, dry and intact. No rash noted. Psychiatric: Mood and affect are normal. Speech and behavior are normal.  ____________________________________________   LABS (all labs ordered are listed, but only abnormal results are displayed)  Labs Reviewed  BASIC METABOLIC PANEL - Abnormal; Notable for the following components:      Result Value   Chloride 96 (*)    Glucose, Bld 112 (*)    BUN 63 (*)    Creatinine, Ser 12.19 (*)    GFR, Estimated 4 (*)    All other components within normal limits  CBC - Abnormal; Notable for the following components:   RBC 3.23 (*)    Hemoglobin 10.3 (*)    HCT 31.2 (*)    RDW 19.4 (*)    All other components within normal limits   ____________________________________________  12 Lead EKG  Sinus rhythm with  rate of 86 bpm.  Normal axis.  Right bundle and left anterior fascicular blocks.  No STEMI. ____________________________________________  RADIOLOGY  ED MD interpretation: 2 view CXR reviewed by me without evidence of acute cardiopulmonary pathology.  Official radiology report(s): DG Chest 2 View  Result Date: 03/28/2021 CLINICAL DATA:  Shortness of breath. Cough. Lower extremity swelling. Hypoxia. EXAM: CHEST - 2 VIEW COMPARISON:  03/15/2021 FINDINGS: The heart size and mediastinal contours are within normal limits. Right upper lobe scarring again noted. Pulmonary interstitial prominence is unchanged. No evidence of acute infiltrate or pleural effusion. Left axillary and subclavian vascular stent noted. IMPRESSION: Stable exam. No active lung disease. Electronically Signed   By: Marlaine Hind M.D.   On: 03/28/2021 19:19    ____________________________________________   PROCEDURES  and INTERVENTIONS  Procedure(s) performed (including Critical Care):  .1-3 Lead EKG Interpretation  Date/Time: 03/28/2021 9:49 PM Performed by: Vladimir Crofts, MD Authorized by: Vladimir Crofts, MD     Interpretation: normal     ECG rate:  80   ECG rate assessment: normal     Rhythm: sinus rhythm     Ectopy: none     Conduction: normal    Medications  acetaminophen (TYLENOL) tablet 1,000 mg (1,000 mg Oral Given 03/28/21 2127)  gabapentin (NEURONTIN) capsule 200 mg (200 mg Oral Given 03/28/21 2127)    ____________________________________________   MDM / ED COURSE   Pleasant 65 year old male presents to the ED with subacute neuropathy and transient hypoxia while he is off of his chronic supplemental oxygen, and ultimately amenable to outpatient management.  He presents primarily requesting additional pain medicines for his subacute and chronic neuropathy to his bilateral lower extremities.  No trauma or signs of neurologic or vascular deficits.  No evidence of ACS, CAP or significant derangements acutely.   Stigmata of ESRD on his blood work.  He was noted to be hypoxic in triage when his portable supplemental O2 tank was empty, but this is quickly rectified by his chronic 2 L O2.  He has no cardiopulmonary complaints to me and is really just requesting analgesia for his neuropathy.  I discussed adherence to his Lyrica and supplemental Tylenol.  We discussed the importance of adherence to dialysis regimen.  I see no barriers to outpatient management.  We will discharge with return precautions.  Clinical Course as of 03/28/21 2150  Fri Mar 28, 2021  2122 Discussed plan of care with the patient.  Offered bilateral venous ultrasound to assess for DVT, but he reports he does not have a blood clot and that he is fine.  He is requesting medications to help with his neuropathy and pain so he can sleep tonight for dialysis tomorrow.  We discussed the importance of adherence to his dialysis regimen.  We discussed the importance of finishing each session.  Discussed following up with his PCP and return precautions for the ED. [DS]    Clinical Course User Index [DS] Vladimir Crofts, MD    ____________________________________________   FINAL CLINICAL IMPRESSION(S) / ED DIAGNOSES  Final diagnoses:  Peripheral edema  Peripheral neuropathic pain  ESRD (end stage renal disease) on dialysis [facility])     ED Discharge Orders     None        Aretta Stetzel   Note:  This document was prepared using Dragon voice recognition software and may include unintentional dictation errors.    Vladimir Crofts, MD 03/28/21 2154

## 2021-03-28 NOTE — ED Notes (Signed)
ED Provider at bedside. 

## 2021-03-28 NOTE — ED Triage Notes (Signed)
Pt comes into the ED via [facility] clinic with [facility] and bilateral lower leg swelling.  Pt was at 81% and was hooked to a tank, but the tank was empty upon [place] bringing him over here.  Pt now on ED O2 tank at 3L (baseline) and is at 94%.  Pt states the swelling in his legs has been there a more than a couple weeks, but patient denies any known CHF.  Pt also admits to pain in both legs.

## 2021-05-05 ENCOUNTER — Other Ambulatory Visit (HOSPITAL_COMMUNITY): Payer: Medicare Other

## 2021-05-05 ENCOUNTER — Institutional Professional Consult (permissible substitution)
Admission: RE | Admit: 2021-05-05 | Discharge: 2021-06-25 | Disposition: A | Discharge status: Rehabilitation Facility | Payer: Medicare Other | Source: Other Acute Inpatient Hospital

## 2021-05-05 DIAGNOSIS — S06309A Unspecified focal traumatic brain injury with loss of consciousness of unspecified duration, initial encounter: Secondary | ICD-10-CM

## 2021-05-05 DIAGNOSIS — Z931 Gastrostomy status: Secondary | ICD-10-CM

## 2021-05-05 DIAGNOSIS — J9690 Respiratory failure, unspecified, unspecified whether with hypoxia or hypercapnia: Secondary | ICD-10-CM

## 2021-05-05 DIAGNOSIS — J189 Pneumonia, unspecified organism: Secondary | ICD-10-CM

## 2021-05-05 DIAGNOSIS — A4101 Sepsis due to Methicillin susceptible Staphylococcus aureus: Secondary | ICD-10-CM

## 2021-05-05 DIAGNOSIS — J9621 Acute and chronic respiratory failure with hypoxia: Secondary | ICD-10-CM | POA: Diagnosis present

## 2021-05-05 DIAGNOSIS — Z452 Encounter for adjustment and management of vascular access device: Secondary | ICD-10-CM

## 2021-05-05 DIAGNOSIS — J95851 Ventilator associated pneumonia: Secondary | ICD-10-CM

## 2021-05-05 DIAGNOSIS — D72829 Elevated white blood cell count, unspecified: Secondary | ICD-10-CM

## 2021-05-05 DIAGNOSIS — S0630AA Unspecified focal traumatic brain injury with loss of consciousness status unknown, initial encounter: Secondary | ICD-10-CM

## 2021-05-05 DIAGNOSIS — R918 Other nonspecific abnormal finding of lung field: Secondary | ICD-10-CM

## 2021-05-05 DIAGNOSIS — R6520 Severe sepsis without septic shock: Secondary | ICD-10-CM

## 2021-05-05 LAB — BLOOD GAS, ARTERIAL
Acid-Base Excess: 1.9 mmol/L (ref 0.0–2.0)
Bicarbonate: 26.2 mmol/L (ref 20.0–28.0)
Drawn by: 164
FIO2: 40
MECHVT: 350 mL
O2 Saturation: 99.3 %
Patient temperature: 36.7
RATE: 10 resp/min
pCO2 arterial: 42.8 mmHg (ref 32.0–48.0)
pH, Arterial: 7.403 (ref 7.350–7.450)
pO2, Arterial: 154 mmHg — ABNORMAL HIGH (ref 83.0–108.0)

## 2021-05-05 MED ORDER — DIATRIZOATE MEGLUMINE & SODIUM 66-10 % PO SOLN
ORAL | Status: AC
  Filled 2021-05-05: qty 30

## 2021-05-05 MED ORDER — DIATRIZOATE MEGLUMINE & SODIUM 66-10 % PO SOLN
30.0000 mL | Freq: Once | ORAL | Status: AC
  Administered 2021-06-05: 20 mL

## 2021-05-06 ENCOUNTER — Other Ambulatory Visit (HOSPITAL_COMMUNITY): Payer: Medicare Other

## 2021-05-06 DIAGNOSIS — A4101 Sepsis due to Methicillin susceptible Staphylococcus aureus: Secondary | ICD-10-CM

## 2021-05-06 DIAGNOSIS — J95851 Ventilator associated pneumonia: Secondary | ICD-10-CM

## 2021-05-06 DIAGNOSIS — J9621 Acute and chronic respiratory failure with hypoxia: Secondary | ICD-10-CM

## 2021-05-06 DIAGNOSIS — R6520 Severe sepsis without septic shock: Secondary | ICD-10-CM

## 2021-05-06 LAB — CBC
HCT: 23.5 % — ABNORMAL LOW (ref 39.0–52.0)
Hemoglobin: 7.4 g/dL — ABNORMAL LOW (ref 13.0–17.0)
MCH: 30 pg (ref 26.0–34.0)
MCHC: 31.5 g/dL (ref 30.0–36.0)
MCV: 95.1 fL (ref 80.0–100.0)
Platelets: 306 10*3/uL (ref 150–400)
RBC: 2.47 MIL/uL — ABNORMAL LOW (ref 4.22–5.81)
RDW: 15.2 % (ref 11.5–15.5)
WBC: 10.4 10*3/uL (ref 4.0–10.5)
nRBC: 0 % (ref 0.0–0.2)

## 2021-05-06 LAB — BASIC METABOLIC PANEL
Anion gap: 15 (ref 5–15)
BUN: 61 mg/dL — ABNORMAL HIGH (ref 8–23)
CO2: 27 mmol/L (ref 22–32)
Calcium: 9 mg/dL (ref 8.9–10.3)
Chloride: 96 mmol/L — ABNORMAL LOW (ref 98–111)
Creatinine, Ser: 10.48 mg/dL — ABNORMAL HIGH (ref 0.61–1.24)
GFR, Estimated: 5 mL/min — ABNORMAL LOW (ref >60)
Glucose, Bld: 107 mg/dL — ABNORMAL HIGH (ref 70–99)
Potassium: 3.4 mmol/L — ABNORMAL LOW (ref 3.5–5.1)
Sodium: 138 mmol/L (ref 135–145)
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
## 2021-05-07 DIAGNOSIS — R6520 Severe sepsis without septic shock: Secondary | ICD-10-CM | POA: Diagnosis not present

## 2021-05-07 DIAGNOSIS — A4101 Sepsis due to Methicillin susceptible Staphylococcus aureus: Secondary | ICD-10-CM | POA: Diagnosis not present

## 2021-05-07 DIAGNOSIS — J95851 Ventilator associated pneumonia: Secondary | ICD-10-CM | POA: Diagnosis not present

## 2021-05-07 DIAGNOSIS — J9621 Acute and chronic respiratory failure with hypoxia: Secondary | ICD-10-CM | POA: Diagnosis not present

## 2021-05-07 LAB — CBC
HCT: 22.9 % — ABNORMAL LOW (ref 39.0–52.0)
Hemoglobin: 7.4 g/dL — ABNORMAL LOW (ref 13.0–17.0)
MCH: 30.1 pg (ref 26.0–34.0)
MCHC: 32.3 g/dL (ref 30.0–36.0)
MCV: 93.1 fL (ref 80.0–100.0)
Platelets: 362 10*3/uL (ref 150–400)
RBC: 2.46 MIL/uL — ABNORMAL LOW (ref 4.22–5.81)
RDW: 15.2 % (ref 11.5–15.5)
WBC: 11.7 10*3/uL — ABNORMAL HIGH (ref 4.0–10.5)
nRBC: 0 % (ref 0.0–0.2)

## 2021-05-07 LAB — HEPATITIS B SURFACE ANTIGEN: Hepatitis B Surface Ag: NONREACTIVE

## 2021-05-07 LAB — BASIC METABOLIC PANEL
Anion gap: 13 (ref 5–15)
BUN: 72 mg/dL — ABNORMAL HIGH (ref 8–23)
CO2: 27 mmol/L (ref 22–32)
Calcium: 9 mg/dL (ref 8.9–10.3)
Chloride: 97 mmol/L — ABNORMAL LOW (ref 98–111)
Creatinine, Ser: 11.71 mg/dL — ABNORMAL HIGH (ref 0.61–1.24)
GFR, Estimated: 4 mL/min — ABNORMAL LOW (ref >60)
Glucose, Bld: 135 mg/dL — ABNORMAL HIGH (ref 70–99)
Potassium: 3.6 mmol/L (ref 3.5–5.1)
Sodium: 137 mmol/L (ref 135–145)
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
## 2021-05-08 DIAGNOSIS — S0630AA Unspecified focal traumatic brain injury with loss of consciousness status unknown, initial encounter: Secondary | ICD-10-CM

## 2021-05-08 DIAGNOSIS — R6520 Severe sepsis without septic shock: Secondary | ICD-10-CM | POA: Diagnosis not present

## 2021-05-08 DIAGNOSIS — A4101 Sepsis due to Methicillin susceptible Staphylococcus aureus: Secondary | ICD-10-CM

## 2021-05-08 DIAGNOSIS — S06309A Unspecified focal traumatic brain injury with loss of consciousness of unspecified duration, initial encounter: Secondary | ICD-10-CM

## 2021-05-08 DIAGNOSIS — J95851 Ventilator associated pneumonia: Secondary | ICD-10-CM

## 2021-05-08 DIAGNOSIS — J9621 Acute and chronic respiratory failure with hypoxia: Secondary | ICD-10-CM | POA: Diagnosis not present

## 2021-05-08 LAB — PTH, INTACT AND CALCIUM
Calcium, Total (PTH): 8.6 mg/dL (ref 8.6–10.2)
PTH: 47 pg/mL (ref 15–65)

## 2021-05-08 LAB — RENAL FUNCTION PANEL
Albumin: 1.9 g/dL — ABNORMAL LOW (ref 3.5–5.0)
Anion gap: 15 (ref 5–15)
BUN: 91 mg/dL — ABNORMAL HIGH (ref 8–23)
CO2: 25 mmol/L (ref 22–32)
Calcium: 8.8 mg/dL — ABNORMAL LOW (ref 8.9–10.3)
Chloride: 95 mmol/L — ABNORMAL LOW (ref 98–111)
Creatinine, Ser: 13.24 mg/dL — ABNORMAL HIGH (ref 0.61–1.24)
GFR, Estimated: 4 mL/min — ABNORMAL LOW (ref >60)
Glucose, Bld: 110 mg/dL — ABNORMAL HIGH (ref 70–99)
Phosphorus: 7.5 mg/dL — ABNORMAL HIGH (ref 2.5–4.6)
Potassium: 3.9 mmol/L (ref 3.5–5.1)
Sodium: 135 mmol/L (ref 135–145)

## 2021-05-08 LAB — CBC
HCT: 22.7 % — ABNORMAL LOW (ref 39.0–52.0)
Hemoglobin: 7.2 g/dL — ABNORMAL LOW (ref 13.0–17.0)
MCH: 29.5 pg (ref 26.0–34.0)
MCHC: 31.7 g/dL (ref 30.0–36.0)
MCV: 93 fL (ref 80.0–100.0)
Platelets: 354 10*3/uL (ref 150–400)
RBC: 2.44 MIL/uL — ABNORMAL LOW (ref 4.22–5.81)
RDW: 15.3 % (ref 11.5–15.5)
WBC: 11.4 10*3/uL — ABNORMAL HIGH (ref 4.0–10.5)
nRBC: 0 % (ref 0.0–0.2)
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
## 2021-05-09 DIAGNOSIS — A4101 Sepsis due to Methicillin susceptible Staphylococcus aureus: Secondary | ICD-10-CM | POA: Diagnosis not present

## 2021-05-09 DIAGNOSIS — R6520 Severe sepsis without septic shock: Secondary | ICD-10-CM | POA: Diagnosis not present

## 2021-05-09 DIAGNOSIS — J95851 Ventilator associated pneumonia: Secondary | ICD-10-CM | POA: Diagnosis not present

## 2021-05-09 DIAGNOSIS — J9621 Acute and chronic respiratory failure with hypoxia: Secondary | ICD-10-CM | POA: Diagnosis not present

## 2021-05-09 LAB — BASIC METABOLIC PANEL
Anion gap: 13 (ref 5–15)
BUN: 56 mg/dL — ABNORMAL HIGH (ref 8–23)
CO2: 27 mmol/L (ref 22–32)
Calcium: 8.8 mg/dL — ABNORMAL LOW (ref 8.9–10.3)
Chloride: 96 mmol/L — ABNORMAL LOW (ref 98–111)
Creatinine, Ser: 8.7 mg/dL — ABNORMAL HIGH (ref 0.61–1.24)
GFR, Estimated: 6 mL/min — ABNORMAL LOW (ref >60)
Glucose, Bld: 117 mg/dL — ABNORMAL HIGH (ref 70–99)
Potassium: 3.7 mmol/L (ref 3.5–5.1)
Sodium: 136 mmol/L (ref 135–145)

## 2021-05-09 LAB — CBC
HCT: 23.3 % — ABNORMAL LOW (ref 39.0–52.0)
Hemoglobin: 7.4 g/dL — ABNORMAL LOW (ref 13.0–17.0)
MCH: 29.6 pg (ref 26.0–34.0)
MCHC: 31.8 g/dL (ref 30.0–36.0)
MCV: 93.2 fL (ref 80.0–100.0)
Platelets: 351 10*3/uL (ref 150–400)
RBC: 2.5 MIL/uL — ABNORMAL LOW (ref 4.22–5.81)
RDW: 14.9 % (ref 11.5–15.5)
WBC: 9.8 10*3/uL (ref 4.0–10.5)
nRBC: 0 % (ref 0.0–0.2)
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
## 2021-05-10 ENCOUNTER — Other Ambulatory Visit (HOSPITAL_COMMUNITY): Payer: Medicare Other

## 2021-05-10 DIAGNOSIS — J95851 Ventilator associated pneumonia: Secondary | ICD-10-CM | POA: Diagnosis not present

## 2021-05-10 DIAGNOSIS — J9621 Acute and chronic respiratory failure with hypoxia: Secondary | ICD-10-CM | POA: Diagnosis not present

## 2021-05-10 DIAGNOSIS — S06309S Unspecified focal traumatic brain injury with loss of consciousness of unspecified duration, sequela: Secondary | ICD-10-CM

## 2021-05-10 DIAGNOSIS — A4101 Sepsis due to Methicillin susceptible Staphylococcus aureus: Secondary | ICD-10-CM | POA: Diagnosis not present

## 2021-05-10 LAB — CBC
HCT: 24.5 % — ABNORMAL LOW (ref 39.0–52.0)
HCT: 22.8 % — ABNORMAL LOW (ref 39.0–52.0)
Hemoglobin: 7.5 g/dL — ABNORMAL LOW (ref 13.0–17.0)
Hemoglobin: 7.3 g/dL — ABNORMAL LOW (ref 13.0–17.0)
MCH: 28.5 pg (ref 26.0–34.0)
MCH: 29.4 pg (ref 26.0–34.0)
MCHC: 30.6 g/dL (ref 30.0–36.0)
MCHC: 32 g/dL (ref 30.0–36.0)
MCV: 93.2 fL (ref 80.0–100.0)
MCV: 91.9 fL (ref 80.0–100.0)
Platelets: 344 10*3/uL (ref 150–400)
Platelets: 357 10*3/uL (ref 150–400)
RBC: 2.63 MIL/uL — ABNORMAL LOW (ref 4.22–5.81)
RBC: 2.48 MIL/uL — ABNORMAL LOW (ref 4.22–5.81)
RDW: 15 % (ref 11.5–15.5)
RDW: 14.9 % (ref 11.5–15.5)
WBC: 10.1 10*3/uL (ref 4.0–10.5)
WBC: 11.2 10*3/uL — ABNORMAL HIGH (ref 4.0–10.5)
nRBC: 0 % (ref 0.0–0.2)
nRBC: 0 % (ref 0.0–0.2)

## 2021-05-10 LAB — RENAL FUNCTION PANEL
Albumin: 1.9 g/dL — ABNORMAL LOW (ref 3.5–5.0)
Anion gap: 12 (ref 5–15)
BUN: 88 mg/dL — ABNORMAL HIGH (ref 8–23)
CO2: 27 mmol/L (ref 22–32)
Calcium: 8.5 mg/dL — ABNORMAL LOW (ref 8.9–10.3)
Chloride: 93 mmol/L — ABNORMAL LOW (ref 98–111)
Creatinine, Ser: 10.21 mg/dL — ABNORMAL HIGH (ref 0.61–1.24)
GFR, Estimated: 5 mL/min — ABNORMAL LOW (ref >60)
Glucose, Bld: 113 mg/dL — ABNORMAL HIGH (ref 70–99)
Phosphorus: 5.1 mg/dL — ABNORMAL HIGH (ref 2.5–4.6)
Potassium: 4.2 mmol/L (ref 3.5–5.1)
Sodium: 132 mmol/L — ABNORMAL LOW (ref 135–145)

## 2021-05-10 NOTE — Progress Notes (Signed)
Pulmonary Critical Care Medicine [place]  PROGRESS NOTE     Donald Silva  [id number]  DOB: 10-27-1955   DOA: 05/05/2021  Referring Physician: Satira Sark, MD  HPI: Donald Silva is a 65 y.o. male being followed for ventilator/airway/oxygen weaning Acute on Chronic Respiratory Failure.  Patient is resting comfortably right now without distress has been on pressure support on 30% FiO2  Medications: Reviewed on Rounds  Physical Exam:  Vitals: Temperature is 96.7 pulse of 81 respiratory rate 21 blood pressure is 127/72 saturations 94%  Ventilator Settings on pressure support FiO2 30% pressure 12/5  General: Comfortable at this time Neck: supple Cardiovascular: no malignant arrhythmias Respiratory: Coarse breath sounds with few scattered rhonchi Skin: no rash seen on limited exam Musculoskeletal: No gross abnormality Psychiatric:unable to assess Neurologic:no involuntary movements         Lab Data:   Basic Metabolic Panel: Recent Labs  Lab 05/06/21 0630 05/07/21 0424 05/07/21 1020 05/08/21 0606 05/09/21 0430 05/10/21 0426  NA 138 137  --  135 136 132*  K 3.4* 3.6  --  3.9 3.7 4.2  CL 96* 97*  --  95* 96* 93*  CO2 27 27  --  25 27 27   GLUCOSE 107* 135*  --  110* 117* 113*  BUN 61* 72*  --  91* 56* 88*  CREATININE 10.48* 11.71*  --  13.24* 8.70* 10.21*  CALCIUM 9.0 9.0 8.6 8.8* 8.8* 8.5*  PHOS  --   --   --  7.5*  --  5.1*    ABG: Recent Labs  Lab 05/05/21 2245  PHART 7.403  PCO2ART 42.8  PO2ART 154*  HCO3 26.2  O2SAT 99.3    Liver Function Tests: Recent Labs  Lab 05/08/21 0606 05/10/21 0426  ALBUMIN 1.9* 1.9*   No results for input(s): LIPASE, AMYLASE in the last 168 hours. No results for input(s): AMMONIA in the last 168 hours.  CBC: Recent Labs  Lab 05/07/21 0424 05/08/21 0606 05/09/21 0430 05/10/21 0426 05/10/21 0750  WBC 11.7* 11.4* 9.8 10.1 11.2*  HGB 7.4* 7.2* 7.4*  7.5* 7.3*  HCT 22.9* 22.7* 23.3* 24.5* 22.8*  MCV 93.1 93.0 93.2 93.2 91.9  PLT 362 354 351 344 357    Cardiac Enzymes: No results for input(s): CKTOTAL, CKMB, CKMBINDEX, TROPONINI in the last 168 hours.  BNP (last 3 results) Recent Labs    02/11/21 0526 03/15/21 0523  BNP 2,409.9* 3,093.1*    ProBNP (last 3 results) No results for input(s): PROBNP in the last 8760 hours.  Radiological Exams: No results found.  Assessment/Plan Active Problems:   Acute on chronic respiratory failure with hypoxia (HCC)   Traumatic intracranial hemorrhage   Ventilator associated pneumonia (HCC)   Severe sepsis with acute organ dysfunction due to methicillin susceptible Staphylococcus aureus (MSSA) (HCC)   Acute on chronic respiratory failure hypoxia we will continue with the pressure support wean the goal today is for 16 hours continue to advance as tolerated. Traumatic intracranial hemorrhage supportive care there is been no major changes or improvement Ventilator associated pneumonia treated we will continue to monitor closely Severe sepsis supportive care Tracheostomy remains in place   I have personally seen and evaluated the patient, evaluated laboratory and imaging results, formulated the assessment and plan and placed orders. The Patient requires high complexity decision making with multiple systems involvement.  Rounds were done with the Respiratory Therapy Director and Staff therapists and discussed with nursing  staff also.  Allyne Gee, MD [facility] Pulmonary Critical Care Medicine Sleep Medicine

## 2021-05-11 LAB — CBC
HCT: 25.7 % — ABNORMAL LOW (ref 39.0–52.0)
Hemoglobin: 8.1 g/dL — ABNORMAL LOW (ref 13.0–17.0)
MCH: 29 pg (ref 26.0–34.0)
MCHC: 31.5 g/dL (ref 30.0–36.0)
MCV: 92.1 fL (ref 80.0–100.0)
Platelets: 386 10*3/uL (ref 150–400)
RBC: 2.79 MIL/uL — ABNORMAL LOW (ref 4.22–5.81)
RDW: 14.6 % (ref 11.5–15.5)
WBC: 12.5 10*3/uL — ABNORMAL HIGH (ref 4.0–10.5)
nRBC: 0 % (ref 0.0–0.2)

## 2021-05-11 LAB — BASIC METABOLIC PANEL
Anion gap: 10 (ref 5–15)
BUN: 61 mg/dL — ABNORMAL HIGH (ref 8–23)
CO2: 29 mmol/L (ref 22–32)
Calcium: 9.2 mg/dL (ref 8.9–10.3)
Chloride: 96 mmol/L — ABNORMAL LOW (ref 98–111)
Creatinine, Ser: 7.15 mg/dL — ABNORMAL HIGH (ref 0.61–1.24)
GFR, Estimated: 8 mL/min — ABNORMAL LOW (ref >60)
Glucose, Bld: 119 mg/dL — ABNORMAL HIGH (ref 70–99)
Potassium: 4.4 mmol/L (ref 3.5–5.1)
Sodium: 135 mmol/L (ref 135–145)

## 2021-05-12 DIAGNOSIS — J95851 Ventilator associated pneumonia: Secondary | ICD-10-CM | POA: Diagnosis not present

## 2021-05-12 DIAGNOSIS — S06309S Unspecified focal traumatic brain injury with loss of consciousness of unspecified duration, sequela: Secondary | ICD-10-CM | POA: Diagnosis not present

## 2021-05-12 DIAGNOSIS — J9621 Acute and chronic respiratory failure with hypoxia: Secondary | ICD-10-CM | POA: Diagnosis not present

## 2021-05-12 DIAGNOSIS — A4101 Sepsis due to Methicillin susceptible Staphylococcus aureus: Secondary | ICD-10-CM | POA: Diagnosis not present

## 2021-05-12 LAB — BASIC METABOLIC PANEL
Anion gap: 11 (ref 5–15)
BUN: 82 mg/dL — ABNORMAL HIGH (ref 8–23)
CO2: 27 mmol/L (ref 22–32)
Calcium: 8.9 mg/dL (ref 8.9–10.3)
Chloride: 94 mmol/L — ABNORMAL LOW (ref 98–111)
Creatinine, Ser: 8.75 mg/dL — ABNORMAL HIGH (ref 0.61–1.24)
GFR, Estimated: 6 mL/min — ABNORMAL LOW (ref >60)
Glucose, Bld: 118 mg/dL — ABNORMAL HIGH (ref 70–99)
Potassium: 4.3 mmol/L (ref 3.5–5.1)
Sodium: 132 mmol/L — ABNORMAL LOW (ref 135–145)

## 2021-05-12 LAB — CBC
HCT: 22.4 % — ABNORMAL LOW (ref 39.0–52.0)
Hemoglobin: 7.2 g/dL — ABNORMAL LOW (ref 13.0–17.0)
MCH: 29.4 pg (ref 26.0–34.0)
MCHC: 32.1 g/dL (ref 30.0–36.0)
MCV: 91.4 fL (ref 80.0–100.0)
Platelets: 317 10*3/uL (ref 150–400)
RBC: 2.45 MIL/uL — ABNORMAL LOW (ref 4.22–5.81)
RDW: 14.7 % (ref 11.5–15.5)
WBC: 10.9 10*3/uL — ABNORMAL HIGH (ref 4.0–10.5)
nRBC: 0 % (ref 0.0–0.2)

## 2021-05-12 NOTE — Progress Notes (Signed)
Pulmonary Critical Care Medicine [place]  PROGRESS NOTE     Donald Silva  [id number]  DOB: December 06, 1955   DOA: 05/05/2021  Referring Physician: Satira Sark, MD  HPI: Donald Silva is a 65 y.o. male being followed for ventilator/airway/oxygen weaning Acute on Chronic Respiratory Failure.  Patient is on T collar currently collar for 2 hours placed back on pressure support  Medications: Reviewed on Rounds  Physical Exam:  Vitals: Temperature is 98.7 pulse 86 respiratory 23 blood pressure is 149/78 saturations 99%  Ventilator Settings on pressure support right now FiO2 28% pressure 12/5 tidal volume 467  General: Comfortable at this time Neck: supple Cardiovascular: no malignant arrhythmias Respiratory: No rhonchi no rales are noted at this time Skin: no rash seen on limited exam Musculoskeletal: No gross abnormality Psychiatric:unable to assess Neurologic:no involuntary movements         Lab Data:   Basic Metabolic Panel: Recent Labs  Lab 05/08/21 0606 05/09/21 0430 05/10/21 0426 05/11/21 0503 05/12/21 0327  NA 135 136 132* 135 132*  K 3.9 3.7 4.2 4.4 4.3  CL 95* 96* 93* 96* 94*  CO2 25 27 27 29 27   GLUCOSE 110* 117* 113* 119* 118*  BUN 91* 56* 88* 61* 82*  CREATININE 13.24* 8.70* 10.21* 7.15* 8.75*  CALCIUM 8.8* 8.8* 8.5* 9.2 8.9  PHOS 7.5*  --  5.1*  --   --     ABG: Recent Labs  Lab 05/05/21 2245  PHART 7.403  PCO2ART 42.8  PO2ART 154*  HCO3 26.2  O2SAT 99.3    Liver Function Tests: Recent Labs  Lab 05/08/21 0606 05/10/21 0426  ALBUMIN 1.9* 1.9*   No results for input(s): LIPASE, AMYLASE in the last 168 hours. No results for input(s): AMMONIA in the last 168 hours.  CBC: Recent Labs  Lab 05/09/21 0430 05/10/21 0426 05/10/21 0750 05/11/21 0503 05/12/21 0327  WBC 9.8 10.1 11.2* 12.5* 10.9*  HGB 7.4* 7.5* 7.3* 8.1* 7.2*  HCT 23.3* 24.5* 22.8* 25.7* 22.4*  MCV 93.2 93.2  91.9 92.1 91.4  PLT 351 344 357 386 317    Cardiac Enzymes: No results for input(s): CKTOTAL, CKMB, CKMBINDEX, TROPONINI in the last 168 hours.  BNP (last 3 results) Recent Labs    02/11/21 0526 03/15/21 0523  BNP 2,409.9* 3,093.1*    ProBNP (last 3 results) No results for input(s): PROBNP in the last 8760 hours.  Radiological Exams: DG CHEST PORT 1 VIEW  Result Date: 05/10/2021 CLINICAL DATA:  Lung infiltrate. EXAM: PORTABLE CHEST 1 VIEW COMPARISON:  05/06/2021 FINDINGS: Tracheostomy tube is unchanged. Patient is rotated towards the LEFT. There is persistent opacity at the LEFT lung base, obscuring the hemidiaphragm. RIGHT lung remains clear. IMPRESSION: Persistent LEFT LOWER lobe opacity. Electronically Signed   By: Nolon Nations M.D.   On: 05/10/2021 16:21    Assessment/Plan Active Problems:   Acute on chronic respiratory failure with hypoxia (HCC)   Traumatic intracranial hemorrhage   Ventilator associated pneumonia (HCC)   Severe sepsis with acute organ dysfunction due to methicillin susceptible Staphylococcus aureus (MSSA) ([place])   Acute on chronic respiratory failure with hypoxia we will continue to wean patient's wean protocol did 2 hours of T collar this morning he tried advance Traumatic intracranial hemorrhage no change we will continue to monitor along closely. Ventilator associated pneumonia has been treated Severe sepsis treated in resolution we will continue with supportive care Tracheostomy remains in place   I  have personally seen and evaluated the patient, evaluated laboratory and imaging results, formulated the assessment and plan and placed orders. The Patient requires high complexity decision making with multiple systems involvement.  Rounds were done with the Respiratory Therapy Director and Staff therapists and discussed with nursing staff also.  Allyne Gee, MD [facility] Pulmonary Critical Care Medicine Sleep Medicine
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
## 2021-05-13 DIAGNOSIS — A4101 Sepsis due to Methicillin susceptible Staphylococcus aureus: Secondary | ICD-10-CM | POA: Diagnosis not present

## 2021-05-13 DIAGNOSIS — J9621 Acute and chronic respiratory failure with hypoxia: Secondary | ICD-10-CM | POA: Diagnosis not present

## 2021-05-13 DIAGNOSIS — J95851 Ventilator associated pneumonia: Secondary | ICD-10-CM | POA: Diagnosis not present

## 2021-05-13 DIAGNOSIS — S06309S Unspecified focal traumatic brain injury with loss of consciousness of unspecified duration, sequela: Secondary | ICD-10-CM | POA: Diagnosis not present

## 2021-05-13 LAB — BASIC METABOLIC PANEL
Anion gap: 12 (ref 5–15)
BUN: 109 mg/dL — ABNORMAL HIGH (ref 8–23)
CO2: 29 mmol/L (ref 22–32)
Calcium: 9 mg/dL (ref 8.9–10.3)
Chloride: 91 mmol/L — ABNORMAL LOW (ref 98–111)
Creatinine, Ser: 9.98 mg/dL — ABNORMAL HIGH (ref 0.61–1.24)
GFR, Estimated: 5 mL/min — ABNORMAL LOW (ref >60)
Glucose, Bld: 122 mg/dL — ABNORMAL HIGH (ref 70–99)
Potassium: 4.5 mmol/L (ref 3.5–5.1)
Sodium: 132 mmol/L — ABNORMAL LOW (ref 135–145)

## 2021-05-13 LAB — CBC
HCT: 22.4 % — ABNORMAL LOW (ref 39.0–52.0)
Hemoglobin: 6.9 g/dL — CL (ref 13.0–17.0)
MCH: 28.6 pg (ref 26.0–34.0)
MCHC: 30.8 g/dL (ref 30.0–36.0)
MCV: 92.9 fL (ref 80.0–100.0)
Platelets: 323 10*3/uL (ref 150–400)
RBC: 2.41 MIL/uL — ABNORMAL LOW (ref 4.22–5.81)
RDW: 14.7 % (ref 11.5–15.5)
WBC: 11.3 10*3/uL — ABNORMAL HIGH (ref 4.0–10.5)
nRBC: 0 % (ref 0.0–0.2)

## 2021-05-13 LAB — PREPARE RBC (CROSSMATCH)

## 2021-05-13 NOTE — Progress Notes (Signed)
Pulmonary Critical Care Medicine [place]  PROGRESS NOTE     Donald Silva  [id number]  DOB: [birth year]   DOA: 05/05/2021  Referring Physician: Satira Sark, MD  HPI: Donald Silva is a 65 y.o. male being followed for ventilator/airway/oxygen weaning Acute on Chronic Respiratory Failure.  Patient currently is wean protocol.  Did 2 hours yesterday supposed to do 4 hours today resting on pressure support  Medications: Reviewed on Rounds  Physical Exam:  Vitals: Temperature is 97.9 pulse 94 respiratory rate 17 blood pressure is 177/96 saturations 99%  Ventilator Settings for T collar 4 hours  General: Comfortable at this time Neck: supple Cardiovascular: no malignant arrhythmias Respiratory: No rhonchi very coarse breath sounds Skin: no rash seen on limited exam Musculoskeletal: No gross abnormality Psychiatric:unable to assess Neurologic:no involuntary movements         Lab Data:   Basic Metabolic Panel: Recent Labs  Lab 05/08/21 0606 05/09/21 0430 05/10/21 0426 05/11/21 0503 05/12/21 0327 05/13/21 0342  NA 135 136 132* 135 132* 132*  K 3.9 3.7 4.2 4.4 4.3 4.5  CL 95* 96* 93* 96* 94* 91*  CO2 25 27 27 29 27 29   GLUCOSE 110* 117* 113* 119* 118* 122*  BUN 91* 56* 88* 61* 82* 109*  CREATININE 13.24* 8.70* 10.21* 7.15* 8.75* 9.98*  CALCIUM 8.8* 8.8* 8.5* 9.2 8.9 9.0  PHOS 7.5*  --  5.1*  --   --   --     ABG: No results for input(s): PHART, PCO2ART, PO2ART, HCO3, O2SAT in the last 168 hours.  Liver Function Tests: Recent Labs  Lab 05/08/21 0606 05/10/21 0426  ALBUMIN 1.9* 1.9*   No results for input(s): LIPASE, AMYLASE in the last 168 hours. No results for input(s): AMMONIA in the last 168 hours.  CBC: Recent Labs  Lab 05/10/21 0426 05/10/21 0750 05/11/21 0503 05/12/21 0327 05/13/21 0342  WBC 10.1 11.2* 12.5* 10.9* 11.3*  HGB 7.5* 7.3* 8.1* 7.2* 6.9*  HCT 24.5* 22.8* 25.7* 22.4*  22.4*  MCV 93.2 91.9 92.1 91.4 92.9  PLT 344 357 386 317 323    Cardiac Enzymes: No results for input(s): CKTOTAL, CKMB, CKMBINDEX, TROPONINI in the last 168 hours.  BNP (last 3 results) Recent Labs    02/11/21 0526 03/15/21 0523  BNP 2,409.9* 3,093.1*    ProBNP (last 3 results) No results for input(s): PROBNP in the last 8760 hours.  Radiological Exams: No results found.  Assessment/Plan Active Problems:   Acute on chronic respiratory failure with hypoxia (HCC)   Traumatic intracranial hemorrhage   Ventilator associated pneumonia (HCC)   Severe sepsis with acute organ dysfunction due to methicillin susceptible Staphylococcus aureus (MSSA) ([place])   Acute on chronic respiratory failure with hypoxia try to continue advancing the wean patient supposed to do 4 hours of T collar trials Traumatic intracranial hemorrhage no change we will continue to monitor along closely Ventilator associated pneumonia patient is at baseline treated Severe sepsis hemodynamics are stable we will continue to monitor Tracheostomy remains in place   I have personally seen and evaluated the patient, evaluated laboratory and imaging results, formulated the assessment and plan and placed orders. The Patient requires high complexity decision making with multiple systems involvement.  Rounds were done with the Respiratory Therapy Director and Staff therapists and discussed with nursing staff also.  Allyne Gee, MD [facility] Pulmonary Critical Care Medicine Sleep Medicine

## 2021-05-14 ENCOUNTER — Other Ambulatory Visit (HOSPITAL_COMMUNITY): Payer: Medicare Other

## 2021-05-14 DIAGNOSIS — S06309S Unspecified focal traumatic brain injury with loss of consciousness of unspecified duration, sequela: Secondary | ICD-10-CM | POA: Diagnosis not present

## 2021-05-14 DIAGNOSIS — J95851 Ventilator associated pneumonia: Secondary | ICD-10-CM | POA: Diagnosis not present

## 2021-05-14 DIAGNOSIS — J9621 Acute and chronic respiratory failure with hypoxia: Secondary | ICD-10-CM | POA: Diagnosis not present

## 2021-05-14 DIAGNOSIS — A4101 Sepsis due to Methicillin susceptible Staphylococcus aureus: Secondary | ICD-10-CM | POA: Diagnosis not present

## 2021-05-14 LAB — CBC
HCT: 27.5 % — ABNORMAL LOW (ref 39.0–52.0)
Hemoglobin: 8.6 g/dL — ABNORMAL LOW (ref 13.0–17.0)
MCH: 29.1 pg (ref 26.0–34.0)
MCHC: 31.3 g/dL (ref 30.0–36.0)
MCV: 92.9 fL (ref 80.0–100.0)
Platelets: 323 10*3/uL (ref 150–400)
RBC: 2.96 MIL/uL — ABNORMAL LOW (ref 4.22–5.81)
RDW: 15.5 % (ref 11.5–15.5)
WBC: 13.5 10*3/uL — ABNORMAL HIGH (ref 4.0–10.5)
nRBC: 0 % (ref 0.0–0.2)

## 2021-05-14 LAB — BASIC METABOLIC PANEL
Anion gap: 9 (ref 5–15)
BUN: 70 mg/dL — ABNORMAL HIGH (ref 8–23)
CO2: 30 mmol/L (ref 22–32)
Calcium: 8.9 mg/dL (ref 8.9–10.3)
Chloride: 95 mmol/L — ABNORMAL LOW (ref 98–111)
Creatinine, Ser: 7.16 mg/dL — ABNORMAL HIGH (ref 0.61–1.24)
GFR, Estimated: 8 mL/min — ABNORMAL LOW (ref >60)
Glucose, Bld: 117 mg/dL — ABNORMAL HIGH (ref 70–99)
Potassium: 4.3 mmol/L (ref 3.5–5.1)
Sodium: 134 mmol/L — ABNORMAL LOW (ref 135–145)

## 2021-05-14 NOTE — Progress Notes (Signed)
Pulmonary Critical Care Medicine [place]  PROGRESS NOTE     FREDRICO Silva  TGY:[id number]  DOB: [birth year]   DOA: 05/05/2021  Referring Physician: Satira Sark, MD  HPI: Donald Silva is a 65 y.o. male being followed for ventilator/airway/oxygen weaning Acute on Chronic Respiratory Failure.  Comfortable right now without distress patient is was placed back on the monitor after having increased work of breathing increased heart rate noted yesterday.  Apparently was placed on the vent assist-control mode but switched over to pressure support which appears to be more comfortable  Medications: Reviewed on Rounds  Physical Exam:  Vitals: Temperature 97.1 pulse 89 respiratory 17 blood pressure is 135/72 saturations 98%  Ventilator Settings on pressure support FiO2 28% pressure 12/5  General: Comfortable at this time Neck: supple Cardiovascular: no malignant arrhythmias Respiratory: No rhonchi very coarse breath sounds Skin: no rash seen on limited exam Musculoskeletal: No gross abnormality Psychiatric:unable to assess Neurologic:no involuntary movements         Lab Data:   Basic Metabolic Panel: Recent Labs  Lab 05/08/21 0606 05/09/21 0430 05/10/21 0426 05/11/21 0503 05/12/21 0327 05/13/21 0342 05/14/21 0333  NA 135   < > 132* 135 132* 132* 134*  K 3.9   < > 4.2 4.4 4.3 4.5 4.3  CL 95*   < > 93* 96* 94* 91* 95*  CO2 25   < > 27 29 27 29 30   GLUCOSE 110*   < > 113* 119* 118* 122* 117*  BUN 91*   < > 88* 61* 82* 109* 70*  CREATININE 13.24*   < > 10.21* 7.15* 8.75* 9.98* 7.16*  CALCIUM 8.8*   < > 8.5* 9.2 8.9 9.0 8.9  PHOS 7.5*  --  5.1*  --   --   --   --    < > = values in this interval not displayed.    ABG: No results for input(s): PHART, PCO2ART, PO2ART, HCO3, O2SAT in the last 168 hours.  Liver Function Tests: Recent Labs  Lab 05/08/21 0606 05/10/21 0426  ALBUMIN 1.9* 1.9*   No results for  input(s): LIPASE, AMYLASE in the last 168 hours. No results for input(s): AMMONIA in the last 168 hours.  CBC: Recent Labs  Lab 05/10/21 0750 05/11/21 0503 05/12/21 0327 05/13/21 0342 05/14/21 0333  WBC 11.2* 12.5* 10.9* 11.3* 13.5*  HGB 7.3* 8.1* 7.2* 6.9* 8.6*  HCT 22.8* 25.7* 22.4* 22.4* 27.5*  MCV 91.9 92.1 91.4 92.9 92.9  PLT 357 386 317 323 323    Cardiac Enzymes: No results for input(s): CKTOTAL, CKMB, CKMBINDEX, TROPONINI in the last 168 hours.  BNP (last 3 results) Recent Labs    02/11/21 0526 03/15/21 0523  BNP 2,409.9* 3,093.1*    ProBNP (last 3 results) No results for input(s): PROBNP in the last 8760 hours.  Radiological Exams: DG Chest Port 1 View  Result Date: 05/14/2021 CLINICAL DATA:  65 year old male with history of left lower lobe pneumonia. History of COVID infection. EXAM: PORTABLE CHEST 1 VIEW COMPARISON:  Chest x-ray 05/10/2021. FINDINGS: A tracheostomy tube is in place with tip 6.0 cm above the carina. Left axillary and subclavian stent noted. Lung volumes are low. Opacity in the left base obscuring the left hemidiaphragm and blunting the left costophrenic sulcus. Right lung is clear. No right pleural effusion. No pneumothorax. No evidence of pulmonary edema. Heart size is mildly enlarged. Upper mediastinal contours are distorted by patient's rotation to  the left. IMPRESSION: 1. Persistent left lower lobe opacity which may reflect areas of atelectasis and/or consolidation, with superimposed small left pleural effusion. 2. Mild cardiomegaly. Electronically Signed   By: Vinnie Langton M.D.   On: 05/14/2021 06:26    Assessment/Plan Active Problems:   Acute on chronic respiratory failure with hypoxia (HCC)   Traumatic intracranial hemorrhage   Ventilator associated pneumonia (HCC)   Severe sepsis with acute organ dysfunction due to methicillin susceptible Staphylococcus aureus (MSSA) ([place])   Acute on chronic respiratory failure hypoxia plan is to  continue with pressure support wean respiratory therapy will reassess if able to do T collar again today Traumatic intracranial hemorrhage no change Clinical improvement Ventilator associated pneumonia treated we will continue to follow along closely. Severe sepsis secondary to MSSA has been treated with antibiotics we will continue to monitor closely Tracheostomy remains in place   I have personally seen and evaluated the patient, evaluated laboratory and imaging results, formulated the assessment and plan and placed orders. The Patient requires high complexity decision making with multiple systems involvement.  Rounds were done with the Respiratory Therapy Director and Staff therapists and discussed with nursing staff also.  Allyne Gee, MD [facility] Pulmonary Critical Care Medicine Sleep Medicine
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
## 2021-05-15 DIAGNOSIS — J95851 Ventilator associated pneumonia: Secondary | ICD-10-CM | POA: Diagnosis not present

## 2021-05-15 DIAGNOSIS — R6520 Severe sepsis without septic shock: Secondary | ICD-10-CM | POA: Diagnosis not present

## 2021-05-15 DIAGNOSIS — A4101 Sepsis due to Methicillin susceptible Staphylococcus aureus: Secondary | ICD-10-CM | POA: Diagnosis not present

## 2021-05-15 DIAGNOSIS — J9621 Acute and chronic respiratory failure with hypoxia: Secondary | ICD-10-CM | POA: Diagnosis not present

## 2021-05-15 LAB — TYPE AND SCREEN
ABO/RH(D): B POS
Antibody Screen: NEGATIVE
Unit division: 0
Unit division: 0

## 2021-05-15 LAB — RENAL FUNCTION PANEL
Albumin: 2.2 g/dL — ABNORMAL LOW (ref 3.5–5.0)
Anion gap: 12 (ref 5–15)
BUN: 85 mg/dL — ABNORMAL HIGH (ref 8–23)
CO2: 30 mmol/L (ref 22–32)
Calcium: 9.4 mg/dL (ref 8.9–10.3)
Chloride: 92 mmol/L — ABNORMAL LOW (ref 98–111)
Creatinine, Ser: 8.46 mg/dL — ABNORMAL HIGH (ref 0.61–1.24)
GFR, Estimated: 6 mL/min — ABNORMAL LOW (ref >60)
Glucose, Bld: 114 mg/dL — ABNORMAL HIGH (ref 70–99)
Phosphorus: 2.9 mg/dL (ref 2.5–4.6)
Potassium: 4.3 mmol/L (ref 3.5–5.1)
Sodium: 134 mmol/L — ABNORMAL LOW (ref 135–145)

## 2021-05-15 LAB — BASIC METABOLIC PANEL
Anion gap: 12 (ref 5–15)
BUN: 85 mg/dL — ABNORMAL HIGH (ref 8–23)
CO2: 30 mmol/L (ref 22–32)
Calcium: 9.5 mg/dL (ref 8.9–10.3)
Chloride: 92 mmol/L — ABNORMAL LOW (ref 98–111)
Creatinine, Ser: 8.54 mg/dL — ABNORMAL HIGH (ref 0.61–1.24)
GFR, Estimated: 6 mL/min — ABNORMAL LOW (ref >60)
Glucose, Bld: 114 mg/dL — ABNORMAL HIGH (ref 70–99)
Potassium: 4.3 mmol/L (ref 3.5–5.1)
Sodium: 134 mmol/L — ABNORMAL LOW (ref 135–145)

## 2021-05-15 LAB — CBC
HCT: 27.3 % — ABNORMAL LOW (ref 39.0–52.0)
Hemoglobin: 8.7 g/dL — ABNORMAL LOW (ref 13.0–17.0)
MCH: 28.7 pg (ref 26.0–34.0)
MCHC: 31.9 g/dL (ref 30.0–36.0)
MCV: 90.1 fL (ref 80.0–100.0)
Platelets: 330 10*3/uL (ref 150–400)
RBC: 3.03 MIL/uL — ABNORMAL LOW (ref 4.22–5.81)
RDW: 15.2 % (ref 11.5–15.5)
WBC: 13.7 10*3/uL — ABNORMAL HIGH (ref 4.0–10.5)
nRBC: 0 % (ref 0.0–0.2)

## 2021-05-15 LAB — BPAM RBC
Blood Product Expiration Date: 202210042359
Blood Product Expiration Date: 202210122359
ISSUE DATE / TIME: 202210040816
ISSUE DATE / TIME: 202210040923
Unit Type and Rh: 1700
Unit Type and Rh: 7300
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
## 2021-05-16 DIAGNOSIS — J9621 Acute and chronic respiratory failure with hypoxia: Secondary | ICD-10-CM | POA: Diagnosis not present

## 2021-05-16 DIAGNOSIS — R6520 Severe sepsis without septic shock: Secondary | ICD-10-CM | POA: Diagnosis not present

## 2021-05-16 DIAGNOSIS — A4101 Sepsis due to Methicillin susceptible Staphylococcus aureus: Secondary | ICD-10-CM | POA: Diagnosis not present

## 2021-05-16 DIAGNOSIS — J95851 Ventilator associated pneumonia: Secondary | ICD-10-CM | POA: Diagnosis not present

## 2021-05-16 LAB — CBC
HCT: 25.4 % — ABNORMAL LOW (ref 39.0–52.0)
Hemoglobin: 8.2 g/dL — ABNORMAL LOW (ref 13.0–17.0)
MCH: 29 pg (ref 26.0–34.0)
MCHC: 32.3 g/dL (ref 30.0–36.0)
MCV: 89.8 fL (ref 80.0–100.0)
Platelets: 276 10*3/uL (ref 150–400)
RBC: 2.83 MIL/uL — ABNORMAL LOW (ref 4.22–5.81)
RDW: 14.9 % (ref 11.5–15.5)
WBC: 12.3 10*3/uL — ABNORMAL HIGH (ref 4.0–10.5)
nRBC: 0 % (ref 0.0–0.2)

## 2021-05-16 LAB — BASIC METABOLIC PANEL
Anion gap: 8 (ref 5–15)
BUN: 51 mg/dL — ABNORMAL HIGH (ref 8–23)
CO2: 29 mmol/L (ref 22–32)
Calcium: 8.6 mg/dL — ABNORMAL LOW (ref 8.9–10.3)
Chloride: 95 mmol/L — ABNORMAL LOW (ref 98–111)
Creatinine, Ser: 5.6 mg/dL — ABNORMAL HIGH (ref 0.61–1.24)
GFR, Estimated: 10 mL/min — ABNORMAL LOW (ref >60)
Glucose, Bld: 113 mg/dL — ABNORMAL HIGH (ref 70–99)
Potassium: 3.7 mmol/L (ref 3.5–5.1)
Sodium: 132 mmol/L — ABNORMAL LOW (ref 135–145)
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
## 2021-05-17 LAB — CBC
HCT: 26 % — ABNORMAL LOW (ref 39.0–52.0)
Hemoglobin: 8 g/dL — ABNORMAL LOW (ref 13.0–17.0)
MCH: 28.5 pg (ref 26.0–34.0)
MCHC: 30.8 g/dL (ref 30.0–36.0)
MCV: 92.5 fL (ref 80.0–100.0)
Platelets: 290 10*3/uL (ref 150–400)
RBC: 2.81 MIL/uL — ABNORMAL LOW (ref 4.22–5.81)
RDW: 14.9 % (ref 11.5–15.5)
WBC: 12.3 10*3/uL — ABNORMAL HIGH (ref 4.0–10.5)
nRBC: 0 % (ref 0.0–0.2)

## 2021-05-17 LAB — RENAL FUNCTION PANEL
Albumin: 2.1 g/dL — ABNORMAL LOW (ref 3.5–5.0)
Anion gap: 9 (ref 5–15)
BUN: 69 mg/dL — ABNORMAL HIGH (ref 8–23)
CO2: 29 mmol/L (ref 22–32)
Calcium: 9.2 mg/dL (ref 8.9–10.3)
Chloride: 94 mmol/L — ABNORMAL LOW (ref 98–111)
Creatinine, Ser: 7.19 mg/dL — ABNORMAL HIGH (ref 0.61–1.24)
GFR, Estimated: 8 mL/min — ABNORMAL LOW (ref >60)
Glucose, Bld: 122 mg/dL — ABNORMAL HIGH (ref 70–99)
Phosphorus: 2.3 mg/dL — ABNORMAL LOW (ref 2.5–4.6)
Potassium: 4.2 mmol/L (ref 3.5–5.1)
Sodium: 132 mmol/L — ABNORMAL LOW (ref 135–145)

## 2021-05-18 LAB — BASIC METABOLIC PANEL
Anion gap: 8 (ref 5–15)
BUN: 53 mg/dL — ABNORMAL HIGH (ref 8–23)
CO2: 29 mmol/L (ref 22–32)
Calcium: 9.2 mg/dL (ref 8.9–10.3)
Chloride: 93 mmol/L — ABNORMAL LOW (ref 98–111)
Creatinine, Ser: 5.94 mg/dL — ABNORMAL HIGH (ref 0.61–1.24)
GFR, Estimated: 10 mL/min — ABNORMAL LOW (ref >60)
Glucose, Bld: 147 mg/dL — ABNORMAL HIGH (ref 70–99)
Potassium: 4.1 mmol/L (ref 3.5–5.1)
Sodium: 130 mmol/L — ABNORMAL LOW (ref 135–145)

## 2021-05-18 LAB — CBC
HCT: 24.8 % — ABNORMAL LOW (ref 39.0–52.0)
Hemoglobin: 7.8 g/dL — ABNORMAL LOW (ref 13.0–17.0)
MCH: 28.5 pg (ref 26.0–34.0)
MCHC: 31.5 g/dL (ref 30.0–36.0)
MCV: 90.5 fL (ref 80.0–100.0)
Platelets: 265 10*3/uL (ref 150–400)
RBC: 2.74 MIL/uL — ABNORMAL LOW (ref 4.22–5.81)
RDW: 15.3 % (ref 11.5–15.5)
WBC: 13.6 10*3/uL — ABNORMAL HIGH (ref 4.0–10.5)
nRBC: 0 % (ref 0.0–0.2)

## 2021-05-19 DIAGNOSIS — J95851 Ventilator associated pneumonia: Secondary | ICD-10-CM | POA: Diagnosis not present

## 2021-05-19 DIAGNOSIS — S06309S Unspecified focal traumatic brain injury with loss of consciousness of unspecified duration, sequela: Secondary | ICD-10-CM | POA: Diagnosis not present

## 2021-05-19 DIAGNOSIS — J9621 Acute and chronic respiratory failure with hypoxia: Secondary | ICD-10-CM | POA: Diagnosis not present

## 2021-05-19 DIAGNOSIS — A4101 Sepsis due to Methicillin susceptible Staphylococcus aureus: Secondary | ICD-10-CM | POA: Diagnosis not present

## 2021-05-19 LAB — CBC
HCT: 28.1 % — ABNORMAL LOW (ref 39.0–52.0)
Hemoglobin: 8.6 g/dL — ABNORMAL LOW (ref 13.0–17.0)
MCH: 28.1 pg (ref 26.0–34.0)
MCHC: 30.6 g/dL (ref 30.0–36.0)
MCV: 91.8 fL (ref 80.0–100.0)
Platelets: 302 10*3/uL (ref 150–400)
RBC: 3.06 MIL/uL — ABNORMAL LOW (ref 4.22–5.81)
RDW: 15.2 % (ref 11.5–15.5)
WBC: 13.7 10*3/uL — ABNORMAL HIGH (ref 4.0–10.5)
nRBC: 0 % (ref 0.0–0.2)

## 2021-05-19 LAB — BASIC METABOLIC PANEL
Anion gap: 12 (ref 5–15)
BUN: 84 mg/dL — ABNORMAL HIGH (ref 8–23)
CO2: 27 mmol/L (ref 22–32)
Calcium: 9.6 mg/dL (ref 8.9–10.3)
Chloride: 92 mmol/L — ABNORMAL LOW (ref 98–111)
Creatinine, Ser: 7.43 mg/dL — ABNORMAL HIGH (ref 0.61–1.24)
GFR, Estimated: 7 mL/min — ABNORMAL LOW (ref >60)
Glucose, Bld: 138 mg/dL — ABNORMAL HIGH (ref 70–99)
Potassium: 4.6 mmol/L (ref 3.5–5.1)
Sodium: 131 mmol/L — ABNORMAL LOW (ref 135–145)
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
## 2021-05-20 DIAGNOSIS — A4101 Sepsis due to Methicillin susceptible Staphylococcus aureus: Secondary | ICD-10-CM | POA: Diagnosis not present

## 2021-05-20 DIAGNOSIS — J9621 Acute and chronic respiratory failure with hypoxia: Secondary | ICD-10-CM | POA: Diagnosis not present

## 2021-05-20 DIAGNOSIS — J95851 Ventilator associated pneumonia: Secondary | ICD-10-CM | POA: Diagnosis not present

## 2021-05-20 DIAGNOSIS — S06309S Unspecified focal traumatic brain injury with loss of consciousness of unspecified duration, sequela: Secondary | ICD-10-CM | POA: Diagnosis not present

## 2021-05-20 LAB — CBC
HCT: 24.8 % — ABNORMAL LOW (ref 39.0–52.0)
Hemoglobin: 8 g/dL — ABNORMAL LOW (ref 13.0–17.0)
MCH: 28.8 pg (ref 26.0–34.0)
MCHC: 32.3 g/dL (ref 30.0–36.0)
MCV: 89.2 fL (ref 80.0–100.0)
Platelets: 266 10*3/uL (ref 150–400)
RBC: 2.78 MIL/uL — ABNORMAL LOW (ref 4.22–5.81)
RDW: 15.5 % (ref 11.5–15.5)
WBC: 11.8 10*3/uL — ABNORMAL HIGH (ref 4.0–10.5)
nRBC: 0 % (ref 0.0–0.2)

## 2021-05-20 LAB — RENAL FUNCTION PANEL
Albumin: 1.9 g/dL — ABNORMAL LOW (ref 3.5–5.0)
Anion gap: 9 (ref 5–15)
BUN: 111 mg/dL — ABNORMAL HIGH (ref 8–23)
CO2: 28 mmol/L (ref 22–32)
Calcium: 9.3 mg/dL (ref 8.9–10.3)
Chloride: 91 mmol/L — ABNORMAL LOW (ref 98–111)
Creatinine, Ser: 8.84 mg/dL — ABNORMAL HIGH (ref 0.61–1.24)
GFR, Estimated: 6 mL/min — ABNORMAL LOW (ref >60)
Glucose, Bld: 130 mg/dL — ABNORMAL HIGH (ref 70–99)
Phosphorus: 2.6 mg/dL (ref 2.5–4.6)
Potassium: 4.5 mmol/L (ref 3.5–5.1)
Sodium: 128 mmol/L — ABNORMAL LOW (ref 135–145)
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
## 2021-05-21 ENCOUNTER — Other Ambulatory Visit: Payer: Medicare Other

## 2021-05-21 DIAGNOSIS — J95851 Ventilator associated pneumonia: Secondary | ICD-10-CM | POA: Diagnosis not present

## 2021-05-21 DIAGNOSIS — S06309S Unspecified focal traumatic brain injury with loss of consciousness of unspecified duration, sequela: Secondary | ICD-10-CM | POA: Diagnosis not present

## 2021-05-21 DIAGNOSIS — J9621 Acute and chronic respiratory failure with hypoxia: Secondary | ICD-10-CM | POA: Diagnosis not present

## 2021-05-21 DIAGNOSIS — A4101 Sepsis due to Methicillin susceptible Staphylococcus aureus: Secondary | ICD-10-CM | POA: Diagnosis not present

## 2021-05-21 LAB — BLOOD GAS, ARTERIAL
Acid-Base Excess: 2.4 mmol/L — ABNORMAL HIGH (ref 0.0–2.0)
Bicarbonate: 27 mmol/L (ref 20.0–28.0)
FIO2: 35
O2 Saturation: 92.3 %
Patient temperature: 37
pCO2 arterial: 46.1 mmHg (ref 32.0–48.0)
pH, Arterial: 7.385 (ref 7.350–7.450)
pO2, Arterial: 65.5 mmHg — ABNORMAL LOW (ref 83.0–108.0)

## 2021-05-21 LAB — BASIC METABOLIC PANEL
Anion gap: 12 (ref 5–15)
BUN: 134 mg/dL — ABNORMAL HIGH (ref 8–23)
CO2: 27 mmol/L (ref 22–32)
Calcium: 10.1 mg/dL (ref 8.9–10.3)
Chloride: 87 mmol/L — ABNORMAL LOW (ref 98–111)
Creatinine, Ser: 9.58 mg/dL — ABNORMAL HIGH (ref 0.61–1.24)
GFR, Estimated: 5 mL/min — ABNORMAL LOW (ref >60)
Glucose, Bld: 141 mg/dL — ABNORMAL HIGH (ref 70–99)
Potassium: 4.7 mmol/L (ref 3.5–5.1)
Sodium: 126 mmol/L — ABNORMAL LOW (ref 135–145)

## 2021-05-21 LAB — CBC
HCT: 27.5 % — ABNORMAL LOW (ref 39.0–52.0)
Hemoglobin: 8.6 g/dL — ABNORMAL LOW (ref 13.0–17.0)
MCH: 27.9 pg (ref 26.0–34.0)
MCHC: 31.3 g/dL (ref 30.0–36.0)
MCV: 89.3 fL (ref 80.0–100.0)
Platelets: 290 10*3/uL (ref 150–400)
RBC: 3.08 MIL/uL — ABNORMAL LOW (ref 4.22–5.81)
RDW: 15.2 % (ref 11.5–15.5)
WBC: 13.7 10*3/uL — ABNORMAL HIGH (ref 4.0–10.5)
nRBC: 0 % (ref 0.0–0.2)
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
## 2021-05-22 DIAGNOSIS — J9621 Acute and chronic respiratory failure with hypoxia: Secondary | ICD-10-CM | POA: Diagnosis not present

## 2021-05-22 DIAGNOSIS — J95851 Ventilator associated pneumonia: Secondary | ICD-10-CM | POA: Diagnosis not present

## 2021-05-22 DIAGNOSIS — R6520 Severe sepsis without septic shock: Secondary | ICD-10-CM | POA: Diagnosis not present

## 2021-05-22 DIAGNOSIS — A4101 Sepsis due to Methicillin susceptible Staphylococcus aureus: Secondary | ICD-10-CM | POA: Diagnosis not present

## 2021-05-22 LAB — RENAL FUNCTION PANEL
Albumin: 1.9 g/dL — ABNORMAL LOW (ref 3.5–5.0)
Anion gap: 11 (ref 5–15)
BUN: 90 mg/dL — ABNORMAL HIGH (ref 8–23)
CO2: 28 mmol/L (ref 22–32)
Calcium: 9.4 mg/dL (ref 8.9–10.3)
Chloride: 89 mmol/L — ABNORMAL LOW (ref 98–111)
Creatinine, Ser: 7.22 mg/dL — ABNORMAL HIGH (ref 0.61–1.24)
GFR, Estimated: 8 mL/min — ABNORMAL LOW (ref >60)
Glucose, Bld: 157 mg/dL — ABNORMAL HIGH (ref 70–99)
Phosphorus: 2.4 mg/dL — ABNORMAL LOW (ref 2.5–4.6)
Potassium: 4.7 mmol/L (ref 3.5–5.1)
Sodium: 128 mmol/L — ABNORMAL LOW (ref 135–145)

## 2021-05-22 LAB — CBC
HCT: 25.3 % — ABNORMAL LOW (ref 39.0–52.0)
Hemoglobin: 8 g/dL — ABNORMAL LOW (ref 13.0–17.0)
MCH: 28.1 pg (ref 26.0–34.0)
MCHC: 31.6 g/dL (ref 30.0–36.0)
MCV: 88.8 fL (ref 80.0–100.0)
Platelets: 303 10*3/uL (ref 150–400)
RBC: 2.85 MIL/uL — ABNORMAL LOW (ref 4.22–5.81)
RDW: 15.1 % (ref 11.5–15.5)
WBC: 12.3 10*3/uL — ABNORMAL HIGH (ref 4.0–10.5)
nRBC: 0 % (ref 0.0–0.2)
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
## 2021-05-23 DIAGNOSIS — J95851 Ventilator associated pneumonia: Secondary | ICD-10-CM | POA: Diagnosis not present

## 2021-05-23 DIAGNOSIS — S06309S Unspecified focal traumatic brain injury with loss of consciousness of unspecified duration, sequela: Secondary | ICD-10-CM | POA: Diagnosis not present

## 2021-05-23 DIAGNOSIS — A4101 Sepsis due to Methicillin susceptible Staphylococcus aureus: Secondary | ICD-10-CM | POA: Diagnosis not present

## 2021-05-23 DIAGNOSIS — J9621 Acute and chronic respiratory failure with hypoxia: Secondary | ICD-10-CM | POA: Diagnosis not present

## 2021-05-23 LAB — CBC
HCT: 26.4 % — ABNORMAL LOW (ref 39.0–52.0)
Hemoglobin: 8.2 g/dL — ABNORMAL LOW (ref 13.0–17.0)
MCH: 28.2 pg (ref 26.0–34.0)
MCHC: 31.1 g/dL (ref 30.0–36.0)
MCV: 90.7 fL (ref 80.0–100.0)
Platelets: 273 10*3/uL (ref 150–400)
RBC: 2.91 MIL/uL — ABNORMAL LOW (ref 4.22–5.81)
RDW: 15.1 % (ref 11.5–15.5)
WBC: 11 10*3/uL — ABNORMAL HIGH (ref 4.0–10.5)
nRBC: 0 % (ref 0.0–0.2)

## 2021-05-23 LAB — BASIC METABOLIC PANEL
Anion gap: 8 (ref 5–15)
BUN: 54 mg/dL — ABNORMAL HIGH (ref 8–23)
CO2: 30 mmol/L (ref 22–32)
Calcium: 9.4 mg/dL (ref 8.9–10.3)
Chloride: 94 mmol/L — ABNORMAL LOW (ref 98–111)
Creatinine, Ser: 5.44 mg/dL — ABNORMAL HIGH (ref 0.61–1.24)
GFR, Estimated: 11 mL/min — ABNORMAL LOW (ref >60)
Glucose, Bld: 161 mg/dL — ABNORMAL HIGH (ref 70–99)
Potassium: 4.2 mmol/L (ref 3.5–5.1)
Sodium: 132 mmol/L — ABNORMAL LOW (ref 135–145)
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
## 2021-05-24 DIAGNOSIS — J95851 Ventilator associated pneumonia: Secondary | ICD-10-CM | POA: Diagnosis not present

## 2021-05-24 DIAGNOSIS — A4101 Sepsis due to Methicillin susceptible Staphylococcus aureus: Secondary | ICD-10-CM | POA: Diagnosis not present

## 2021-05-24 DIAGNOSIS — J9621 Acute and chronic respiratory failure with hypoxia: Secondary | ICD-10-CM | POA: Diagnosis not present

## 2021-05-24 DIAGNOSIS — S06309S Unspecified focal traumatic brain injury with loss of consciousness of unspecified duration, sequela: Secondary | ICD-10-CM | POA: Diagnosis not present

## 2021-05-24 LAB — RENAL FUNCTION PANEL
Albumin: 1.9 g/dL — ABNORMAL LOW (ref 3.5–5.0)
Anion gap: 10 (ref 5–15)
BUN: 86 mg/dL — ABNORMAL HIGH (ref 8–23)
CO2: 29 mmol/L (ref 22–32)
Calcium: 9.6 mg/dL (ref 8.9–10.3)
Chloride: 91 mmol/L — ABNORMAL LOW (ref 98–111)
Creatinine, Ser: 7.08 mg/dL — ABNORMAL HIGH (ref 0.61–1.24)
GFR, Estimated: 8 mL/min — ABNORMAL LOW (ref >60)
Glucose, Bld: 138 mg/dL — ABNORMAL HIGH (ref 70–99)
Phosphorus: 2.2 mg/dL — ABNORMAL LOW (ref 2.5–4.6)
Potassium: 4.8 mmol/L (ref 3.5–5.1)
Sodium: 130 mmol/L — ABNORMAL LOW (ref 135–145)

## 2021-05-24 LAB — CBC
HCT: 27.3 % — ABNORMAL LOW (ref 39.0–52.0)
Hemoglobin: 8.6 g/dL — ABNORMAL LOW (ref 13.0–17.0)
MCH: 28.1 pg (ref 26.0–34.0)
MCHC: 31.5 g/dL (ref 30.0–36.0)
MCV: 89.2 fL (ref 80.0–100.0)
Platelets: 305 10*3/uL (ref 150–400)
RBC: 3.06 MIL/uL — ABNORMAL LOW (ref 4.22–5.81)
RDW: 15.3 % (ref 11.5–15.5)
WBC: 14.6 10*3/uL — ABNORMAL HIGH (ref 4.0–10.5)
nRBC: 0.1 % (ref 0.0–0.2)
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
## 2021-05-25 LAB — CBC
HCT: 27.1 % — ABNORMAL LOW (ref 39.0–52.0)
Hemoglobin: 8.6 g/dL — ABNORMAL LOW (ref 13.0–17.0)
MCH: 27.8 pg (ref 26.0–34.0)
MCHC: 31.7 g/dL (ref 30.0–36.0)
MCV: 87.7 fL (ref 80.0–100.0)
Platelets: 282 10*3/uL (ref 150–400)
RBC: 3.09 MIL/uL — ABNORMAL LOW (ref 4.22–5.81)
RDW: 15.3 % (ref 11.5–15.5)
WBC: 14.1 10*3/uL — ABNORMAL HIGH (ref 4.0–10.5)
nRBC: 0 % (ref 0.0–0.2)

## 2021-05-25 LAB — BASIC METABOLIC PANEL
Anion gap: 11 (ref 5–15)
BUN: 51 mg/dL — ABNORMAL HIGH (ref 8–23)
CO2: 28 mmol/L (ref 22–32)
Calcium: 9.4 mg/dL (ref 8.9–10.3)
Chloride: 93 mmol/L — ABNORMAL LOW (ref 98–111)
Creatinine, Ser: 5.02 mg/dL — ABNORMAL HIGH (ref 0.61–1.24)
GFR, Estimated: 12 mL/min — ABNORMAL LOW (ref >60)
Glucose, Bld: 177 mg/dL — ABNORMAL HIGH (ref 70–99)
Potassium: 4.4 mmol/L (ref 3.5–5.1)
Sodium: 132 mmol/L — ABNORMAL LOW (ref 135–145)

## 2021-05-26 ENCOUNTER — Other Ambulatory Visit (HOSPITAL_COMMUNITY): Payer: Medicare Other

## 2021-05-26 DIAGNOSIS — A4101 Sepsis due to Methicillin susceptible Staphylococcus aureus: Secondary | ICD-10-CM | POA: Diagnosis not present

## 2021-05-26 DIAGNOSIS — J95851 Ventilator associated pneumonia: Secondary | ICD-10-CM | POA: Diagnosis not present

## 2021-05-26 DIAGNOSIS — S06309S Unspecified focal traumatic brain injury with loss of consciousness of unspecified duration, sequela: Secondary | ICD-10-CM | POA: Diagnosis not present

## 2021-05-26 DIAGNOSIS — J9621 Acute and chronic respiratory failure with hypoxia: Secondary | ICD-10-CM | POA: Diagnosis not present

## 2021-05-26 LAB — CBC
HCT: 25.9 % — ABNORMAL LOW (ref 39.0–52.0)
Hemoglobin: 8.2 g/dL — ABNORMAL LOW (ref 13.0–17.0)
MCH: 27.8 pg (ref 26.0–34.0)
MCHC: 31.7 g/dL (ref 30.0–36.0)
MCV: 87.8 fL (ref 80.0–100.0)
Platelets: 301 10*3/uL (ref 150–400)
RBC: 2.95 MIL/uL — ABNORMAL LOW (ref 4.22–5.81)
RDW: 15.6 % — ABNORMAL HIGH (ref 11.5–15.5)
WBC: 13.3 10*3/uL — ABNORMAL HIGH (ref 4.0–10.5)
nRBC: 0 % (ref 0.0–0.2)

## 2021-05-26 LAB — BASIC METABOLIC PANEL
Anion gap: 11 (ref 5–15)
BUN: 81 mg/dL — ABNORMAL HIGH (ref 8–23)
CO2: 26 mmol/L (ref 22–32)
Calcium: 9.5 mg/dL (ref 8.9–10.3)
Chloride: 91 mmol/L — ABNORMAL LOW (ref 98–111)
Creatinine, Ser: 6.51 mg/dL — ABNORMAL HIGH (ref 0.61–1.24)
GFR, Estimated: 9 mL/min — ABNORMAL LOW (ref >60)
Glucose, Bld: 167 mg/dL — ABNORMAL HIGH (ref 70–99)
Potassium: 4.4 mmol/L (ref 3.5–5.1)
Sodium: 128 mmol/L — ABNORMAL LOW (ref 135–145)

## 2021-05-26 NOTE — Progress Notes (Signed)
Pulmonary Critical Care Medicine [place]  PROGRESS NOTE     Donald Silva  [id number]  DOB: 1956/03/16   DOA: 05/05/2021  Referring Physician: Satira Sark, MD  HPI: Donald Silva is a 65 y.o. male being followed for ventilator/airway/oxygen weaning Acute on Chronic Respiratory Failure.  Patient currently is on T collar has been on 40% FiO2 using PMV.  Medications: Reviewed on Rounds  Physical Exam:  Vitals: Temperature is 98.8 pulse 90 respiratory is 19 blood pressure is 163/89 saturations 95%  Ventilator Settings T collar FiO2 of 40%  General: Comfortable at this time Neck: supple Cardiovascular: no malignant arrhythmias Respiratory: Scattered rhonchi expansion is equal Skin: no rash seen on limited exam Musculoskeletal: No gross abnormality Psychiatric:unable to assess Neurologic:no involuntary movements         Lab Data:   Basic Metabolic Panel: Recent Labs  Lab 05/20/21 0417 05/21/21 0337 05/22/21 0619 05/23/21 0524 05/24/21 0735 05/25/21 0431 05/26/21 0516  NA 128*   < > 128* 132* 130* 132* 128*  K 4.5   < > 4.7 4.2 4.8 4.4 4.4  CL 91*   < > 89* 94* 91* 93* 91*  CO2 28   < > 28 30 29 28 26   GLUCOSE 130*   < > 157* 161* 138* 177* 167*  BUN 111*   < > 90* 54* 86* 51* 81*  CREATININE 8.84*   < > 7.22* 5.44* 7.08* 5.02* 6.51*  CALCIUM 9.3   < > 9.4 9.4 9.6 9.4 9.5  PHOS 2.6  --  2.4*  --  2.2*  --   --    < > = values in this interval not displayed.    ABG: Recent Labs  Lab 05/21/21 0728  PHART 7.385  PCO2ART 46.1  PO2ART 65.5*  HCO3 27.0  O2SAT 92.3    Liver Function Tests: Recent Labs  Lab 05/20/21 0417 05/22/21 0619 05/24/21 0735  ALBUMIN 1.9* 1.9* 1.9*   No results for input(s): LIPASE, AMYLASE in the last 168 hours. No results for input(s): AMMONIA in the last 168 hours.  CBC: Recent Labs  Lab 05/22/21 0619 05/23/21 0524 05/24/21 0735 05/25/21 0431  05/26/21 0637  WBC 12.3* 11.0* 14.6* 14.1* 13.3*  HGB 8.0* 8.2* 8.6* 8.6* 8.2*  HCT 25.3* 26.4* 27.3* 27.1* 25.9*  MCV 88.8 90.7 89.2 87.7 87.8  PLT 303 273 305 282 301    Cardiac Enzymes: No results for input(s): CKTOTAL, CKMB, CKMBINDEX, TROPONINI in the last 168 hours.  BNP (last 3 results) Recent Labs    02/11/21 0526 03/15/21 0523  BNP 2,409.9* 3,093.1*    ProBNP (last 3 results) No results for input(s): PROBNP in the last 8760 hours.  Radiological Exams: DG Chest Port 1 View  Result Date: 05/26/2021 CLINICAL DATA:  Leukocytosis. EXAM: PORTABLE CHEST 1 VIEW COMPARISON:  Radiographs 05/14/2021 and 05/10/2021. FINDINGS: 1305 hours. Tracheostomy tube appears unchanged. Left subclavian and axillary vascular stents remain in place. The heart is enlarged. Patchy bilateral airspace opacities are present, new or worsened on the right. The left basilar aeration is slightly improved. There is no pneumothorax or significant pleural effusion. The bones appear unchanged. IMPRESSION: Patchy bilateral airspace opacities suspicious for multilobar pneumonia. The right-sided components are new or increased from 05/14/2021. Electronically Signed   By: Richardean Sale M.D.   On: 05/26/2021 13:34    Assessment/Plan Active Problems:   Acute on chronic respiratory failure with hypoxia [facility])   Traumatic  intracranial hemorrhage   Ventilator associated pneumonia (HCC)   Severe sepsis with acute organ dysfunction due to methicillin susceptible Staphylococcus aureus (MSSA) ([place])   Acute on chronic respiratory failure with hypoxia we will continue with T-piece titrate oxygen as tolerated patient right now is on 40% FiO2 with good saturations Traumatic intracranial hemorrhage no change we will continue to follow along Ventilator associated pneumonia treated supportive care Severe sepsis supportive care we will continue to follow as resolved hemodynamics are stable Tracheostomy will remain in place  right now   I have personally seen and evaluated the patient, evaluated laboratory and imaging results, formulated the assessment and plan and placed orders. The Patient requires high complexity decision making with multiple systems involvement.  Rounds were done with the Respiratory Therapy Director and Staff therapists and discussed with nursing staff also.  Allyne Gee, MD [facility] Pulmonary Critical Care Medicine Sleep Medicine
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
## 2021-05-27 DIAGNOSIS — S06309S Unspecified focal traumatic brain injury with loss of consciousness of unspecified duration, sequela: Secondary | ICD-10-CM | POA: Diagnosis not present

## 2021-05-27 DIAGNOSIS — J9621 Acute and chronic respiratory failure with hypoxia: Secondary | ICD-10-CM | POA: Diagnosis not present

## 2021-05-27 DIAGNOSIS — J95851 Ventilator associated pneumonia: Secondary | ICD-10-CM | POA: Diagnosis not present

## 2021-05-27 DIAGNOSIS — A4101 Sepsis due to Methicillin susceptible Staphylococcus aureus: Secondary | ICD-10-CM | POA: Diagnosis not present

## 2021-05-27 LAB — CBC
HCT: 24.7 % — ABNORMAL LOW (ref 39.0–52.0)
Hemoglobin: 7.8 g/dL — ABNORMAL LOW (ref 13.0–17.0)
MCH: 27.7 pg (ref 26.0–34.0)
MCHC: 31.6 g/dL (ref 30.0–36.0)
MCV: 87.6 fL (ref 80.0–100.0)
Platelets: 294 10*3/uL (ref 150–400)
RBC: 2.82 MIL/uL — ABNORMAL LOW (ref 4.22–5.81)
RDW: 15.5 % (ref 11.5–15.5)
WBC: 11.3 10*3/uL — ABNORMAL HIGH (ref 4.0–10.5)
nRBC: 0 % (ref 0.0–0.2)

## 2021-05-27 LAB — RENAL FUNCTION PANEL
Albumin: 1.8 g/dL — ABNORMAL LOW (ref 3.5–5.0)
Anion gap: 11 (ref 5–15)
BUN: 101 mg/dL — ABNORMAL HIGH (ref 8–23)
CO2: 28 mmol/L (ref 22–32)
Calcium: 9.9 mg/dL (ref 8.9–10.3)
Chloride: 92 mmol/L — ABNORMAL LOW (ref 98–111)
Creatinine, Ser: 7.78 mg/dL — ABNORMAL HIGH (ref 0.61–1.24)
GFR, Estimated: 7 mL/min — ABNORMAL LOW (ref >60)
Glucose, Bld: 144 mg/dL — ABNORMAL HIGH (ref 70–99)
Phosphorus: 2.5 mg/dL (ref 2.5–4.6)
Potassium: 4.5 mmol/L (ref 3.5–5.1)
Sodium: 131 mmol/L — ABNORMAL LOW (ref 135–145)

## 2021-05-27 LAB — EXPECTORATED SPUTUM ASSESSMENT W GRAM STAIN, RFLX TO RESP C

## 2021-05-27 NOTE — Progress Notes (Signed)
Pulmonary Critical Care Medicine [place]  PROGRESS NOTE     Donald Silva  [id number]:[id number]  DOB: 1956/01/11   DOA: 05/05/2021  Referring Physician: Satira Sark, MD  HPI: Donald Silva is a 65 y.o. male being followed for ventilator/airway/oxygen weaning Acute on Chronic Respiratory Failure.  Patient is on T collar currently on 40% FiO2  Medications: Reviewed on Rounds  Physical Exam:  Vitals: Temperature is 97.3 pulse 77 respiratory rate is 27 blood pressure is 129/66 saturations 98%  Ventilator Settings off the ventilator on trach collar FiO2 40%  General: Comfortable at this time Neck: supple Cardiovascular: no malignant arrhythmias Respiratory: No rhonchi very coarse breath sounds Skin: no rash seen on limited exam Musculoskeletal: No gross abnormality Psychiatric:unable to assess Neurologic:no involuntary movements         Lab Data:   Basic Metabolic Panel: Recent Labs  Lab 05/22/21 0619 05/23/21 0524 05/24/21 0735 05/25/21 0431 05/26/21 0516 05/27/21 0431  NA 128* 132* 130* 132* 128* 131*  K 4.7 4.2 4.8 4.4 4.4 4.5  CL 89* 94* 91* 93* 91* 92*  CO2 28 30 29 28 26 28   GLUCOSE 157* 161* 138* 177* 167* 144*  BUN 90* 54* 86* 51* 81* 101*  CREATININE 7.22* 5.44* 7.08* 5.02* 6.51* 7.78*  CALCIUM 9.4 9.4 9.6 9.4 9.5 9.9  PHOS 2.4*  --  2.2*  --   --  2.5    ABG: Recent Labs  Lab 05/21/21 0728  PHART 7.385  PCO2ART 46.1  PO2ART 65.5*  HCO3 27.0  O2SAT 92.3    Liver Function Tests: Recent Labs  Lab 05/22/21 0619 05/24/21 0735 05/27/21 0431  ALBUMIN 1.9* 1.9* 1.8*   No results for input(s): LIPASE, AMYLASE in the last 168 hours. No results for input(s): AMMONIA in the last 168 hours.  CBC: Recent Labs  Lab 05/23/21 0524 05/24/21 0735 05/25/21 0431 05/26/21 0637 05/27/21 0431  WBC 11.0* 14.6* 14.1* 13.3* 11.3*  HGB 8.2* 8.6* 8.6* 8.2* 7.8*  HCT 26.4* 27.3* 27.1* 25.9* 24.7*   MCV 90.7 89.2 87.7 87.8 87.6  PLT 273 305 282 301 294    Cardiac Enzymes: No results for input(s): CKTOTAL, CKMB, CKMBINDEX, TROPONINI in the last 168 hours.  BNP (last 3 results) Recent Labs    02/11/21 0526 03/15/21 0523  BNP 2,409.9* 3,093.1*    ProBNP (last 3 results) No results for input(s): PROBNP in the last 8760 hours.  Radiological Exams: DG Chest Port 1 View  Result Date: 05/26/2021 CLINICAL DATA:  Leukocytosis. EXAM: PORTABLE CHEST 1 VIEW COMPARISON:  Radiographs 05/14/2021 and 05/10/2021. FINDINGS: 1305 hours. Tracheostomy tube appears unchanged. Left subclavian and axillary vascular stents remain in place. The heart is enlarged. Patchy bilateral airspace opacities are present, new or worsened on the right. The left basilar aeration is slightly improved. There is no pneumothorax or significant pleural effusion. The bones appear unchanged. IMPRESSION: Patchy bilateral airspace opacities suspicious for multilobar pneumonia. The right-sided components are new or increased from 05/14/2021. Electronically Signed   By: Richardean Sale M.D.   On: 05/26/2021 13:34    Assessment/Plan Active Problems:   Acute on chronic respiratory failure with hypoxia (HCC)   Traumatic intracranial hemorrhage   Ventilator associated pneumonia (HCC)   Severe sepsis with acute organ dysfunction due to methicillin susceptible Staphylococcus aureus (MSSA) ([place])   Acute on chronic respiratory failure hypoxia we will continue with the T collar patient's weaning secretions are still significant saturations were  Asked respiratory therapy to wean FiO2 down Traumatic intracranial hemorrhage no change we will continue with supportive care Ventilator associated pneumonia there are some airspace disease noted on the chest film Severe sepsis has been treated we will continue to monitor along closely. Increase tracheal secretions possibly tracheobronchitis versus pneumonia   I have personally seen and  evaluated the patient, evaluated laboratory and imaging results, formulated the assessment and plan and placed orders. The Patient requires high complexity decision making with multiple systems involvement.  Rounds were done with the Respiratory Therapy Director and Staff therapists and discussed with nursing staff also.  Allyne Gee, MD [facility] Pulmonary Critical Care Medicine Sleep Medicine

## 2021-05-28 DIAGNOSIS — J95851 Ventilator associated pneumonia: Secondary | ICD-10-CM | POA: Diagnosis not present

## 2021-05-28 DIAGNOSIS — J9621 Acute and chronic respiratory failure with hypoxia: Secondary | ICD-10-CM | POA: Diagnosis not present

## 2021-05-28 DIAGNOSIS — A4101 Sepsis due to Methicillin susceptible Staphylococcus aureus: Secondary | ICD-10-CM | POA: Diagnosis not present

## 2021-05-28 DIAGNOSIS — R6520 Severe sepsis without septic shock: Secondary | ICD-10-CM | POA: Diagnosis not present

## 2021-05-28 LAB — BASIC METABOLIC PANEL
Anion gap: 8 (ref 5–15)
BUN: 66 mg/dL — ABNORMAL HIGH (ref 8–23)
CO2: 27 mmol/L (ref 22–32)
Calcium: 9.4 mg/dL (ref 8.9–10.3)
Chloride: 91 mmol/L — ABNORMAL LOW (ref 98–111)
Creatinine, Ser: 5.52 mg/dL — ABNORMAL HIGH (ref 0.61–1.24)
GFR, Estimated: 11 mL/min — ABNORMAL LOW (ref >60)
Glucose, Bld: 108 mg/dL — ABNORMAL HIGH (ref 70–99)
Potassium: 4.1 mmol/L (ref 3.5–5.1)
Sodium: 126 mmol/L — ABNORMAL LOW (ref 135–145)

## 2021-05-28 LAB — CBC
HCT: 25.7 % — ABNORMAL LOW (ref 39.0–52.0)
Hemoglobin: 8 g/dL — ABNORMAL LOW (ref 13.0–17.0)
MCH: 27.9 pg (ref 26.0–34.0)
MCHC: 31.1 g/dL (ref 30.0–36.0)
MCV: 89.5 fL (ref 80.0–100.0)
Platelets: 311 10*3/uL (ref 150–400)
RBC: 2.87 MIL/uL — ABNORMAL LOW (ref 4.22–5.81)
RDW: 15.5 % (ref 11.5–15.5)
WBC: 10.7 10*3/uL — ABNORMAL HIGH (ref 4.0–10.5)
nRBC: 0 % (ref 0.0–0.2)
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
## 2021-05-28 NOTE — Progress Notes (Signed)
Pulmonary Critical Care Medicine [place]  PROGRESS NOTE     Donald Silva  IBB:[id number]  DOB: February 07, 1956   DOA: 05/05/2021  Referring Physician: Satira Sark, MD  HPI: Donald Silva is a 65 y.o. male being followed for ventilator/airway/oxygen weaning Acute on Chronic Respiratory Failure.  Patient at this time is on T collar has been on 20% FiO2 has been using the PMV  Medications: Reviewed on Rounds  Physical Exam:  Vitals: Temperature is 98.0 pulse 86 respiratory rate 17 blood pressure 157/93 saturations 97%  Ventilator Settings currently on T collar FiO2 28%  General: Comfortable at this time Neck: supple Cardiovascular: no malignant arrhythmias Respiratory: Scattered rhonchi expansion is equal Skin: no rash seen on limited exam Musculoskeletal: No gross abnormality Psychiatric:unable to assess Neurologic:no involuntary movements         Lab Data:   Basic Metabolic Panel: Recent Labs  Lab 05/22/21 0619 05/23/21 0524 05/24/21 0735 05/25/21 0431 05/26/21 0516 05/27/21 0431 05/28/21 0409  NA 128*   < > 130* 132* 128* 131* 126*  K 4.7   < > 4.8 4.4 4.4 4.5 4.1  CL 89*   < > 91* 93* 91* 92* 91*  CO2 28   < > 29 28 26 28 27   GLUCOSE 157*   < > 138* 177* 167* 144* 108*  BUN 90*   < > 86* 51* 81* 101* 66*  CREATININE 7.22*   < > 7.08* 5.02* 6.51* 7.78* 5.52*  CALCIUM 9.4   < > 9.6 9.4 9.5 9.9 9.4  PHOS 2.4*  --  2.2*  --   --  2.5  --    < > = values in this interval not displayed.    ABG: No results for input(s): PHART, PCO2ART, PO2ART, HCO3, O2SAT in the last 168 hours.  Liver Function Tests: Recent Labs  Lab 05/22/21 0619 05/24/21 0735 05/27/21 0431  ALBUMIN 1.9* 1.9* 1.8*   No results for input(s): LIPASE, AMYLASE in the last 168 hours. No results for input(s): AMMONIA in the last 168 hours.  CBC: Recent Labs  Lab 05/24/21 0735 05/25/21 0431 05/26/21 0637 05/27/21 0431  05/28/21 0409  WBC 14.6* 14.1* 13.3* 11.3* 10.7*  HGB 8.6* 8.6* 8.2* 7.8* 8.0*  HCT 27.3* 27.1* 25.9* 24.7* 25.7*  MCV 89.2 87.7 87.8 87.6 89.5  PLT 305 282 301 294 311    Cardiac Enzymes: No results for input(s): CKTOTAL, CKMB, CKMBINDEX, TROPONINI in the last 168 hours.  BNP (last 3 results) Recent Labs    02/11/21 0526 03/15/21 0523  BNP 2,409.9* 3,093.1*    ProBNP (last 3 results) No results for input(s): PROBNP in the last 8760 hours.  Radiological Exams: DG Chest Port 1 View  Result Date: 05/26/2021 CLINICAL DATA:  Leukocytosis. EXAM: PORTABLE CHEST 1 VIEW COMPARISON:  Radiographs 05/14/2021 and 05/10/2021. FINDINGS: 1305 hours. Tracheostomy tube appears unchanged. Left subclavian and axillary vascular stents remain in place. The heart is enlarged. Patchy bilateral airspace opacities are present, new or worsened on the right. The left basilar aeration is slightly improved. There is no pneumothorax or significant pleural effusion. The bones appear unchanged. IMPRESSION: Patchy bilateral airspace opacities suspicious for multilobar pneumonia. The right-sided components are new or increased from 05/14/2021. Electronically Signed   By: Richardean Sale M.D.   On: 05/26/2021 13:34    Assessment/Plan Active Problems:   Acute on chronic respiratory failure with hypoxia [facility])   Traumatic intracranial hemorrhage  Ventilator associated pneumonia ([place])   Severe sepsis with acute organ dysfunction due to methicillin susceptible Staphylococcus aureus (MSSA) ([place])   Acute on chronic respiratory failure hypoxia patient seems to be doing okay with T-piece and PMV spoke with respiratory therapy about possibly trying to do capping Traumatic intracranial hemorrhage no change we will continue to follow along closely Ventilator associated pneumonia has been treated we will continue with supportive care Severe sepsis secondary to MSSA treated with antibiotics Tracheostomy right now will  remain in place   I have personally seen and evaluated the patient, evaluated laboratory and imaging results, formulated the assessment and plan and placed orders. The Patient requires high complexity decision making with multiple systems involvement.  Rounds were done with the Respiratory Therapy Director and Staff therapists and discussed with nursing staff also.  Allyne Gee, MD [facility] Pulmonary Critical Care Medicine Sleep Medicine

## 2021-05-29 DIAGNOSIS — J95851 Ventilator associated pneumonia: Secondary | ICD-10-CM | POA: Diagnosis not present

## 2021-05-29 DIAGNOSIS — S06309S Unspecified focal traumatic brain injury with loss of consciousness of unspecified duration, sequela: Secondary | ICD-10-CM | POA: Diagnosis not present

## 2021-05-29 DIAGNOSIS — A4101 Sepsis due to Methicillin susceptible Staphylococcus aureus: Secondary | ICD-10-CM | POA: Diagnosis not present

## 2021-05-29 DIAGNOSIS — J9621 Acute and chronic respiratory failure with hypoxia: Secondary | ICD-10-CM | POA: Diagnosis not present

## 2021-05-29 LAB — CBC
HCT: 27.1 % — ABNORMAL LOW (ref 39.0–52.0)
Hemoglobin: 8.3 g/dL — ABNORMAL LOW (ref 13.0–17.0)
MCH: 27.2 pg (ref 26.0–34.0)
MCHC: 30.6 g/dL (ref 30.0–36.0)
MCV: 88.9 fL (ref 80.0–100.0)
Platelets: 334 10*3/uL (ref 150–400)
RBC: 3.05 MIL/uL — ABNORMAL LOW (ref 4.22–5.81)
RDW: 15.6 % — ABNORMAL HIGH (ref 11.5–15.5)
WBC: 9.4 10*3/uL (ref 4.0–10.5)
nRBC: 0.2 % (ref 0.0–0.2)

## 2021-05-29 LAB — RENAL FUNCTION PANEL
Albumin: 2 g/dL — ABNORMAL LOW (ref 3.5–5.0)
Anion gap: 11 (ref 5–15)
BUN: 83 mg/dL — ABNORMAL HIGH (ref 8–23)
CO2: 27 mmol/L (ref 22–32)
Calcium: 10.1 mg/dL (ref 8.9–10.3)
Chloride: 94 mmol/L — ABNORMAL LOW (ref 98–111)
Creatinine, Ser: 6.72 mg/dL — ABNORMAL HIGH (ref 0.61–1.24)
GFR, Estimated: 8 mL/min — ABNORMAL LOW (ref >60)
Glucose, Bld: 143 mg/dL — ABNORMAL HIGH (ref 70–99)
Phosphorus: 2.5 mg/dL (ref 2.5–4.6)
Potassium: 4.3 mmol/L (ref 3.5–5.1)
Sodium: 132 mmol/L — ABNORMAL LOW (ref 135–145)

## 2021-05-29 LAB — HEPATITIS B CORE ANTIBODY, TOTAL: Hep B Core Total Ab: NONREACTIVE

## 2021-05-29 LAB — HEPATITIS B SURFACE ANTIBODY,QUALITATIVE: Hep B S Ab: REACTIVE — AB
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
## 2021-05-30 DIAGNOSIS — R6520 Severe sepsis without septic shock: Secondary | ICD-10-CM | POA: Diagnosis not present

## 2021-05-30 DIAGNOSIS — J9621 Acute and chronic respiratory failure with hypoxia: Secondary | ICD-10-CM | POA: Diagnosis not present

## 2021-05-30 DIAGNOSIS — A4101 Sepsis due to Methicillin susceptible Staphylococcus aureus: Secondary | ICD-10-CM | POA: Diagnosis not present

## 2021-05-30 DIAGNOSIS — J95851 Ventilator associated pneumonia: Secondary | ICD-10-CM | POA: Diagnosis not present

## 2021-05-30 LAB — BASIC METABOLIC PANEL
Anion gap: 9 (ref 5–15)
BUN: 58 mg/dL — ABNORMAL HIGH (ref 8–23)
CO2: 28 mmol/L (ref 22–32)
Calcium: 10.1 mg/dL (ref 8.9–10.3)
Chloride: 96 mmol/L — ABNORMAL LOW (ref 98–111)
Creatinine, Ser: 5.48 mg/dL — ABNORMAL HIGH (ref 0.61–1.24)
GFR, Estimated: 11 mL/min — ABNORMAL LOW (ref >60)
Glucose, Bld: 118 mg/dL — ABNORMAL HIGH (ref 70–99)
Potassium: 3.9 mmol/L (ref 3.5–5.1)
Sodium: 133 mmol/L — ABNORMAL LOW (ref 135–145)

## 2021-05-30 LAB — CBC
HCT: 27.1 % — ABNORMAL LOW (ref 39.0–52.0)
Hemoglobin: 8.2 g/dL — ABNORMAL LOW (ref 13.0–17.0)
MCH: 27.2 pg (ref 26.0–34.0)
MCHC: 30.3 g/dL (ref 30.0–36.0)
MCV: 89.7 fL (ref 80.0–100.0)
Platelets: 342 10*3/uL (ref 150–400)
RBC: 3.02 MIL/uL — ABNORMAL LOW (ref 4.22–5.81)
RDW: 15.5 % (ref 11.5–15.5)
WBC: 10.9 10*3/uL — ABNORMAL HIGH (ref 4.0–10.5)
nRBC: 0.2 % (ref 0.0–0.2)
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
## 2021-05-31 ENCOUNTER — Other Ambulatory Visit (HOSPITAL_COMMUNITY): Payer: Medicare Other

## 2021-05-31 DIAGNOSIS — J9621 Acute and chronic respiratory failure with hypoxia: Secondary | ICD-10-CM | POA: Diagnosis not present

## 2021-05-31 DIAGNOSIS — R6520 Severe sepsis without septic shock: Secondary | ICD-10-CM | POA: Diagnosis not present

## 2021-05-31 DIAGNOSIS — A4101 Sepsis due to Methicillin susceptible Staphylococcus aureus: Secondary | ICD-10-CM | POA: Diagnosis not present

## 2021-05-31 DIAGNOSIS — J95851 Ventilator associated pneumonia: Secondary | ICD-10-CM | POA: Diagnosis not present

## 2021-05-31 LAB — CULTURE, RESPIRATORY W GRAM STAIN

## 2021-05-31 LAB — RENAL FUNCTION PANEL
Albumin: 2 g/dL — ABNORMAL LOW (ref 3.5–5.0)
Anion gap: 11 (ref 5–15)
BUN: 77 mg/dL — ABNORMAL HIGH (ref 8–23)
CO2: 26 mmol/L (ref 22–32)
Calcium: 10.3 mg/dL (ref 8.9–10.3)
Chloride: 96 mmol/L — ABNORMAL LOW (ref 98–111)
Creatinine, Ser: 6.62 mg/dL — ABNORMAL HIGH (ref 0.61–1.24)
GFR, Estimated: 9 mL/min — ABNORMAL LOW (ref >60)
Glucose, Bld: 117 mg/dL — ABNORMAL HIGH (ref 70–99)
Phosphorus: 2.6 mg/dL (ref 2.5–4.6)
Potassium: 4.5 mmol/L (ref 3.5–5.1)
Sodium: 133 mmol/L — ABNORMAL LOW (ref 135–145)

## 2021-05-31 LAB — CBC
HCT: 26.4 % — ABNORMAL LOW (ref 39.0–52.0)
Hemoglobin: 7.9 g/dL — ABNORMAL LOW (ref 13.0–17.0)
MCH: 27.1 pg (ref 26.0–34.0)
MCHC: 29.9 g/dL — ABNORMAL LOW (ref 30.0–36.0)
MCV: 90.4 fL (ref 80.0–100.0)
Platelets: 321 10*3/uL (ref 150–400)
RBC: 2.92 MIL/uL — ABNORMAL LOW (ref 4.22–5.81)
RDW: 15.9 % — ABNORMAL HIGH (ref 11.5–15.5)
WBC: 9.2 10*3/uL (ref 4.0–10.5)
nRBC: 0 % (ref 0.0–0.2)
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
## 2021-06-01 LAB — CBC
HCT: 25.6 % — ABNORMAL LOW (ref 39.0–52.0)
Hemoglobin: 7.7 g/dL — ABNORMAL LOW (ref 13.0–17.0)
MCH: 27.2 pg (ref 26.0–34.0)
MCHC: 30.1 g/dL (ref 30.0–36.0)
MCV: 90.5 fL (ref 80.0–100.0)
Platelets: 344 10*3/uL (ref 150–400)
RBC: 2.83 MIL/uL — ABNORMAL LOW (ref 4.22–5.81)
RDW: 15.9 % — ABNORMAL HIGH (ref 11.5–15.5)
WBC: 10.3 10*3/uL (ref 4.0–10.5)
nRBC: 0 % (ref 0.0–0.2)

## 2021-06-01 LAB — BASIC METABOLIC PANEL
Anion gap: 13 (ref 5–15)
BUN: 54 mg/dL — ABNORMAL HIGH (ref 8–23)
CO2: 25 mmol/L (ref 22–32)
Calcium: 10 mg/dL (ref 8.9–10.3)
Chloride: 94 mmol/L — ABNORMAL LOW (ref 98–111)
Creatinine, Ser: 4.86 mg/dL — ABNORMAL HIGH (ref 0.61–1.24)
GFR, Estimated: 12 mL/min — ABNORMAL LOW (ref >60)
Glucose, Bld: 123 mg/dL — ABNORMAL HIGH (ref 70–99)
Potassium: 4.2 mmol/L (ref 3.5–5.1)
Sodium: 132 mmol/L — ABNORMAL LOW (ref 135–145)

## 2021-06-02 LAB — BASIC METABOLIC PANEL
Anion gap: 10 (ref 5–15)
BUN: 72 mg/dL — ABNORMAL HIGH (ref 8–23)
CO2: 29 mmol/L (ref 22–32)
Calcium: 10 mg/dL (ref 8.9–10.3)
Chloride: 93 mmol/L — ABNORMAL LOW (ref 98–111)
Creatinine, Ser: 6.51 mg/dL — ABNORMAL HIGH (ref 0.61–1.24)
GFR, Estimated: 9 mL/min — ABNORMAL LOW (ref >60)
Glucose, Bld: 137 mg/dL — ABNORMAL HIGH (ref 70–99)
Potassium: 3.9 mmol/L (ref 3.5–5.1)
Sodium: 132 mmol/L — ABNORMAL LOW (ref 135–145)

## 2021-06-02 LAB — CBC
HCT: 24.8 % — ABNORMAL LOW (ref 39.0–52.0)
Hemoglobin: 7.4 g/dL — ABNORMAL LOW (ref 13.0–17.0)
MCH: 26.9 pg (ref 26.0–34.0)
MCHC: 29.8 g/dL — ABNORMAL LOW (ref 30.0–36.0)
MCV: 90.2 fL (ref 80.0–100.0)
Platelets: 320 10*3/uL (ref 150–400)
RBC: 2.75 MIL/uL — ABNORMAL LOW (ref 4.22–5.81)
RDW: 16.3 % — ABNORMAL HIGH (ref 11.5–15.5)
WBC: 9.2 10*3/uL (ref 4.0–10.5)
nRBC: 0 % (ref 0.0–0.2)
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
## 2021-06-03 LAB — RENAL FUNCTION PANEL
Albumin: 2.1 g/dL — ABNORMAL LOW (ref 3.5–5.0)
Anion gap: 10 (ref 5–15)
BUN: 88 mg/dL — ABNORMAL HIGH (ref 8–23)
CO2: 28 mmol/L (ref 22–32)
Calcium: 10.3 mg/dL (ref 8.9–10.3)
Chloride: 93 mmol/L — ABNORMAL LOW (ref 98–111)
Creatinine, Ser: 7.34 mg/dL — ABNORMAL HIGH (ref 0.61–1.24)
GFR, Estimated: 8 mL/min — ABNORMAL LOW (ref >60)
Glucose, Bld: 126 mg/dL — ABNORMAL HIGH (ref 70–99)
Phosphorus: 2.4 mg/dL — ABNORMAL LOW (ref 2.5–4.6)
Potassium: 4.4 mmol/L (ref 3.5–5.1)
Sodium: 131 mmol/L — ABNORMAL LOW (ref 135–145)

## 2021-06-03 LAB — CBC
HCT: 28.2 % — ABNORMAL LOW (ref 39.0–52.0)
Hemoglobin: 8.6 g/dL — ABNORMAL LOW (ref 13.0–17.0)
MCH: 27.1 pg (ref 26.0–34.0)
MCHC: 30.5 g/dL (ref 30.0–36.0)
MCV: 89 fL (ref 80.0–100.0)
Platelets: 358 10*3/uL (ref 150–400)
RBC: 3.17 MIL/uL — ABNORMAL LOW (ref 4.22–5.81)
RDW: 16.7 % — ABNORMAL HIGH (ref 11.5–15.5)
WBC: 11 10*3/uL — ABNORMAL HIGH (ref 4.0–10.5)
nRBC: 0 % (ref 0.0–0.2)

## 2021-06-03 LAB — VANCOMYCIN, TROUGH: Vancomycin Tr: 14 ug/mL — ABNORMAL LOW (ref 15–20)

## 2021-06-04 LAB — BASIC METABOLIC PANEL
Anion gap: 11 (ref 5–15)
BUN: 65 mg/dL — ABNORMAL HIGH (ref 8–23)
CO2: 28 mmol/L (ref 22–32)
Calcium: 9.7 mg/dL (ref 8.9–10.3)
Chloride: 94 mmol/L — ABNORMAL LOW (ref 98–111)
Creatinine, Ser: 5.75 mg/dL — ABNORMAL HIGH (ref 0.61–1.24)
GFR, Estimated: 10 mL/min — ABNORMAL LOW (ref >60)
Glucose, Bld: 132 mg/dL — ABNORMAL HIGH (ref 70–99)
Potassium: 4.4 mmol/L (ref 3.5–5.1)
Sodium: 133 mmol/L — ABNORMAL LOW (ref 135–145)

## 2021-06-04 LAB — CBC
HCT: 26.6 % — ABNORMAL LOW (ref 39.0–52.0)
Hemoglobin: 8 g/dL — ABNORMAL LOW (ref 13.0–17.0)
MCH: 27.1 pg (ref 26.0–34.0)
MCHC: 30.1 g/dL (ref 30.0–36.0)
MCV: 90.2 fL (ref 80.0–100.0)
Platelets: 301 10*3/uL (ref 150–400)
RBC: 2.95 MIL/uL — ABNORMAL LOW (ref 4.22–5.81)
RDW: 17 % — ABNORMAL HIGH (ref 11.5–15.5)
WBC: 10.8 10*3/uL — ABNORMAL HIGH (ref 4.0–10.5)
nRBC: 0.2 % (ref 0.0–0.2)

## 2021-06-04 LAB — HEPATITIS B SURFACE ANTIGEN: Hepatitis B Surface Ag: NONREACTIVE
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
## 2021-06-05 ENCOUNTER — Other Ambulatory Visit (HOSPITAL_COMMUNITY): Payer: Medicare Other

## 2021-06-05 LAB — RENAL FUNCTION PANEL
Albumin: 2.2 g/dL — ABNORMAL LOW (ref 3.5–5.0)
Anion gap: 11 (ref 5–15)
BUN: 89 mg/dL — ABNORMAL HIGH (ref 8–23)
CO2: 29 mmol/L (ref 22–32)
Calcium: 10.4 mg/dL — ABNORMAL HIGH (ref 8.9–10.3)
Chloride: 91 mmol/L — ABNORMAL LOW (ref 98–111)
Creatinine, Ser: 6.9 mg/dL — ABNORMAL HIGH (ref 0.61–1.24)
GFR, Estimated: 8 mL/min — ABNORMAL LOW (ref >60)
Glucose, Bld: 136 mg/dL — ABNORMAL HIGH (ref 70–99)
Phosphorus: 2.6 mg/dL (ref 2.5–4.6)
Potassium: 4.4 mmol/L (ref 3.5–5.1)
Sodium: 131 mmol/L — ABNORMAL LOW (ref 135–145)

## 2021-06-05 LAB — VANCOMYCIN, TROUGH: Vancomycin Tr: 19 ug/mL (ref 15–20)

## 2021-06-06 ENCOUNTER — Other Ambulatory Visit (HOSPITAL_COMMUNITY): Payer: Medicare Other

## 2021-06-06 HISTORY — PX: IR REPLACE G-TUBE SIMPLE WO FLUORO: IMG2323

## 2021-06-06 HISTORY — PX: IR GASTROSTOMY TUBE REMOVAL: IMG5492
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
## 2021-06-07 ENCOUNTER — Encounter (HOSPITAL_COMMUNITY): Payer: Self-pay

## 2021-06-07 LAB — RENAL FUNCTION PANEL
Albumin: 2.2 g/dL — ABNORMAL LOW (ref 3.5–5.0)
Anion gap: 9 (ref 5–15)
BUN: 63 mg/dL — ABNORMAL HIGH (ref 8–23)
CO2: 27 mmol/L (ref 22–32)
Calcium: 10.3 mg/dL (ref 8.9–10.3)
Chloride: 98 mmol/L (ref 98–111)
Creatinine, Ser: 6.66 mg/dL — ABNORMAL HIGH (ref 0.61–1.24)
GFR, Estimated: 8 mL/min — ABNORMAL LOW (ref >60)
Glucose, Bld: 115 mg/dL — ABNORMAL HIGH (ref 70–99)
Phosphorus: 2.4 mg/dL — ABNORMAL LOW (ref 2.5–4.6)
Potassium: 4.2 mmol/L (ref 3.5–5.1)
Sodium: 134 mmol/L — ABNORMAL LOW (ref 135–145)

## 2021-06-08 ENCOUNTER — Other Ambulatory Visit (HOSPITAL_COMMUNITY): Payer: Medicare Other

## 2021-06-09 ENCOUNTER — Other Ambulatory Visit (HOSPITAL_COMMUNITY): Payer: Medicare Other

## 2021-06-09 LAB — RENAL FUNCTION PANEL
Albumin: 2.4 g/dL — ABNORMAL LOW (ref 3.5–5.0)
Anion gap: 9 (ref 5–15)
BUN: 48 mg/dL — ABNORMAL HIGH (ref 8–23)
CO2: 29 mmol/L (ref 22–32)
Calcium: 10.2 mg/dL (ref 8.9–10.3)
Chloride: 94 mmol/L — ABNORMAL LOW (ref 98–111)
Creatinine, Ser: 5.72 mg/dL — ABNORMAL HIGH (ref 0.61–1.24)
GFR, Estimated: 10 mL/min — ABNORMAL LOW (ref >60)
Glucose, Bld: 96 mg/dL (ref 70–99)
Phosphorus: 2.4 mg/dL — ABNORMAL LOW (ref 2.5–4.6)
Potassium: 4.2 mmol/L (ref 3.5–5.1)
Sodium: 132 mmol/L — ABNORMAL LOW (ref 135–145)

## 2021-06-09 LAB — CBC
HCT: 29.4 % — ABNORMAL LOW (ref 39.0–52.0)
Hemoglobin: 8.9 g/dL — ABNORMAL LOW (ref 13.0–17.0)
MCH: 27.8 pg (ref 26.0–34.0)
MCHC: 30.3 g/dL (ref 30.0–36.0)
MCV: 91.9 fL (ref 80.0–100.0)
Platelets: 288 10*3/uL (ref 150–400)
RBC: 3.2 MIL/uL — ABNORMAL LOW (ref 4.22–5.81)
RDW: 18.6 % — ABNORMAL HIGH (ref 11.5–15.5)
WBC: 8.1 10*3/uL (ref 4.0–10.5)
nRBC: 0 % (ref 0.0–0.2)

## 2021-06-09 MED ORDER — DIATRIZOATE MEGLUMINE & SODIUM 66-10 % PO SOLN
30.0000 mL | Freq: Once | ORAL | Status: AC
  Administered 2021-06-09: 30 mL
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
## 2021-06-10 LAB — HEMOGLOBIN A1C
Hgb A1c MFr Bld: 5.4 % (ref 4.8–5.6)
Mean Plasma Glucose: 108.28 mg/dL

## 2021-06-11 LAB — RENAL FUNCTION PANEL
Albumin: 2.1 g/dL — ABNORMAL LOW (ref 3.5–5.0)
Anion gap: 7 (ref 5–15)
BUN: 45 mg/dL — ABNORMAL HIGH (ref 8–23)
CO2: 27 mmol/L (ref 22–32)
Calcium: 10.1 mg/dL (ref 8.9–10.3)
Chloride: 98 mmol/L (ref 98–111)
Creatinine, Ser: 5.5 mg/dL — ABNORMAL HIGH (ref 0.61–1.24)
GFR, Estimated: 11 mL/min — ABNORMAL LOW (ref >60)
Glucose, Bld: 129 mg/dL — ABNORMAL HIGH (ref 70–99)
Phosphorus: 2.7 mg/dL (ref 2.5–4.6)
Potassium: 4.2 mmol/L (ref 3.5–5.1)
Sodium: 132 mmol/L — ABNORMAL LOW (ref 135–145)

## 2021-06-11 LAB — CBC
HCT: 29.6 % — ABNORMAL LOW (ref 39.0–52.0)
Hemoglobin: 9.1 g/dL — ABNORMAL LOW (ref 13.0–17.0)
MCH: 28.2 pg (ref 26.0–34.0)
MCHC: 30.7 g/dL (ref 30.0–36.0)
MCV: 91.6 fL (ref 80.0–100.0)
Platelets: 240 10*3/uL (ref 150–400)
RBC: 3.23 MIL/uL — ABNORMAL LOW (ref 4.22–5.81)
RDW: 19 % — ABNORMAL HIGH (ref 11.5–15.5)
WBC: 6.1 10*3/uL (ref 4.0–10.5)
nRBC: 0 % (ref 0.0–0.2)
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
## 2021-06-12 ENCOUNTER — Other Ambulatory Visit (HOSPITAL_COMMUNITY): Payer: Medicare Other

## 2021-06-13 LAB — CBC
HCT: 29.5 % — ABNORMAL LOW (ref 39.0–52.0)
Hemoglobin: 8.7 g/dL — ABNORMAL LOW (ref 13.0–17.0)
MCH: 27.1 pg (ref 26.0–34.0)
MCHC: 29.5 g/dL — ABNORMAL LOW (ref 30.0–36.0)
MCV: 91.9 fL (ref 80.0–100.0)
Platelets: 261 10*3/uL (ref 150–400)
RBC: 3.21 MIL/uL — ABNORMAL LOW (ref 4.22–5.81)
RDW: 19.4 % — ABNORMAL HIGH (ref 11.5–15.5)
WBC: 7.3 10*3/uL (ref 4.0–10.5)
nRBC: 0 % (ref 0.0–0.2)

## 2021-06-13 LAB — RENAL FUNCTION PANEL
Albumin: 2.5 g/dL — ABNORMAL LOW (ref 3.5–5.0)
Anion gap: 8 (ref 5–15)
BUN: 57 mg/dL — ABNORMAL HIGH (ref 8–23)
CO2: 29 mmol/L (ref 22–32)
Calcium: 11 mg/dL — ABNORMAL HIGH (ref 8.9–10.3)
Chloride: 94 mmol/L — ABNORMAL LOW (ref 98–111)
Creatinine, Ser: 5.68 mg/dL — ABNORMAL HIGH (ref 0.61–1.24)
GFR, Estimated: 10 mL/min — ABNORMAL LOW (ref >60)
Glucose, Bld: 145 mg/dL — ABNORMAL HIGH (ref 70–99)
Phosphorus: 2.4 mg/dL — ABNORMAL LOW (ref 2.5–4.6)
Potassium: 4 mmol/L (ref 3.5–5.1)
Sodium: 131 mmol/L — ABNORMAL LOW (ref 135–145)
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
## 2021-06-16 LAB — RENAL FUNCTION PANEL
Albumin: 2.5 g/dL — ABNORMAL LOW (ref 3.5–5.0)
Anion gap: 12 (ref 5–15)
BUN: 65 mg/dL — ABNORMAL HIGH (ref 8–23)
CO2: 26 mmol/L (ref 22–32)
Calcium: 10.4 mg/dL — ABNORMAL HIGH (ref 8.9–10.3)
Chloride: 90 mmol/L — ABNORMAL LOW (ref 98–111)
Creatinine, Ser: 6.37 mg/dL — ABNORMAL HIGH (ref 0.61–1.24)
GFR, Estimated: 9 mL/min — ABNORMAL LOW (ref >60)
Glucose, Bld: 118 mg/dL — ABNORMAL HIGH (ref 70–99)
Phosphorus: 2.1 mg/dL — ABNORMAL LOW (ref 2.5–4.6)
Potassium: 4.7 mmol/L (ref 3.5–5.1)
Sodium: 128 mmol/L — ABNORMAL LOW (ref 135–145)

## 2021-06-16 LAB — CBC
HCT: 31 % — ABNORMAL LOW (ref 39.0–52.0)
Hemoglobin: 9.2 g/dL — ABNORMAL LOW (ref 13.0–17.0)
MCH: 27.5 pg (ref 26.0–34.0)
MCHC: 29.7 g/dL — ABNORMAL LOW (ref 30.0–36.0)
MCV: 92.5 fL (ref 80.0–100.0)
Platelets: 201 10*3/uL (ref 150–400)
RBC: 3.35 MIL/uL — ABNORMAL LOW (ref 4.22–5.81)
RDW: 19.2 % — ABNORMAL HIGH (ref 11.5–15.5)
WBC: 7 10*3/uL (ref 4.0–10.5)
nRBC: 0 % (ref 0.0–0.2)
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
## 2021-06-18 LAB — RENAL FUNCTION PANEL
Albumin: 2.4 g/dL — ABNORMAL LOW (ref 3.5–5.0)
Anion gap: 11 (ref 5–15)
BUN: 69 mg/dL — ABNORMAL HIGH (ref 8–23)
CO2: 27 mmol/L (ref 22–32)
Calcium: 10.6 mg/dL — ABNORMAL HIGH (ref 8.9–10.3)
Chloride: 89 mmol/L — ABNORMAL LOW (ref 98–111)
Creatinine, Ser: 6.14 mg/dL — ABNORMAL HIGH (ref 0.61–1.24)
GFR, Estimated: 9 mL/min — ABNORMAL LOW (ref >60)
Glucose, Bld: 126 mg/dL — ABNORMAL HIGH (ref 70–99)
Phosphorus: 2.4 mg/dL — ABNORMAL LOW (ref 2.5–4.6)
Potassium: 3.7 mmol/L (ref 3.5–5.1)
Sodium: 127 mmol/L — ABNORMAL LOW (ref 135–145)

## 2021-06-18 LAB — CBC
HCT: 29.5 % — ABNORMAL LOW (ref 39.0–52.0)
Hemoglobin: 9.1 g/dL — ABNORMAL LOW (ref 13.0–17.0)
MCH: 27.7 pg (ref 26.0–34.0)
MCHC: 30.8 g/dL (ref 30.0–36.0)
MCV: 89.9 fL (ref 80.0–100.0)
Platelets: 185 10*3/uL (ref 150–400)
RBC: 3.28 MIL/uL — ABNORMAL LOW (ref 4.22–5.81)
RDW: 18.9 % — ABNORMAL HIGH (ref 11.5–15.5)
WBC: 6.2 10*3/uL (ref 4.0–10.5)
nRBC: 0 % (ref 0.0–0.2)
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
## 2021-06-20 LAB — CBC
HCT: 29.8 % — ABNORMAL LOW (ref 39.0–52.0)
Hemoglobin: 9.1 g/dL — ABNORMAL LOW (ref 13.0–17.0)
MCH: 27.7 pg (ref 26.0–34.0)
MCHC: 30.5 g/dL (ref 30.0–36.0)
MCV: 90.9 fL (ref 80.0–100.0)
Platelets: 172 10*3/uL (ref 150–400)
RBC: 3.28 MIL/uL — ABNORMAL LOW (ref 4.22–5.81)
RDW: 18.3 % — ABNORMAL HIGH (ref 11.5–15.5)
WBC: 6.2 10*3/uL (ref 4.0–10.5)
nRBC: 0 % (ref 0.0–0.2)

## 2021-06-20 LAB — RENAL FUNCTION PANEL
Albumin: 2.5 g/dL — ABNORMAL LOW (ref 3.5–5.0)
Anion gap: 10 (ref 5–15)
BUN: 73 mg/dL — ABNORMAL HIGH (ref 8–23)
CO2: 28 mmol/L (ref 22–32)
Calcium: 10.5 mg/dL — ABNORMAL HIGH (ref 8.9–10.3)
Chloride: 91 mmol/L — ABNORMAL LOW (ref 98–111)
Creatinine, Ser: 6.1 mg/dL — ABNORMAL HIGH (ref 0.61–1.24)
GFR, Estimated: 9 mL/min — ABNORMAL LOW (ref >60)
Glucose, Bld: 118 mg/dL — ABNORMAL HIGH (ref 70–99)
Phosphorus: 2.1 mg/dL — ABNORMAL LOW (ref 2.5–4.6)
Potassium: 4.1 mmol/L (ref 3.5–5.1)
Sodium: 129 mmol/L — ABNORMAL LOW (ref 135–145)
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
## 2021-06-23 LAB — RENAL FUNCTION PANEL
Albumin: 2.3 g/dL — ABNORMAL LOW (ref 3.5–5.0)
Anion gap: 7 (ref 5–15)
BUN: 77 mg/dL — ABNORMAL HIGH (ref 8–23)
CO2: 30 mmol/L (ref 22–32)
Calcium: 10.5 mg/dL — ABNORMAL HIGH (ref 8.9–10.3)
Chloride: 93 mmol/L — ABNORMAL LOW (ref 98–111)
Creatinine, Ser: 7.03 mg/dL — ABNORMAL HIGH (ref 0.61–1.24)
GFR, Estimated: 8 mL/min — ABNORMAL LOW (ref >60)
Glucose, Bld: 104 mg/dL — ABNORMAL HIGH (ref 70–99)
Phosphorus: 2.7 mg/dL (ref 2.5–4.6)
Potassium: 5 mmol/L (ref 3.5–5.1)
Sodium: 130 mmol/L — ABNORMAL LOW (ref 135–145)

## 2021-06-23 LAB — CBC
HCT: 30.3 % — ABNORMAL LOW (ref 39.0–52.0)
Hemoglobin: 9 g/dL — ABNORMAL LOW (ref 13.0–17.0)
MCH: 27.5 pg (ref 26.0–34.0)
MCHC: 29.7 g/dL — ABNORMAL LOW (ref 30.0–36.0)
MCV: 92.7 fL (ref 80.0–100.0)
Platelets: 145 10*3/uL — ABNORMAL LOW (ref 150–400)
RBC: 3.27 MIL/uL — ABNORMAL LOW (ref 4.22–5.81)
RDW: 18.6 % — ABNORMAL HIGH (ref 11.5–15.5)
WBC: 5.8 10*3/uL (ref 4.0–10.5)
nRBC: 0 % (ref 0.0–0.2)
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
## 2021-06-25 LAB — CBC
HCT: 28.6 % — ABNORMAL LOW (ref 39.0–52.0)
Hemoglobin: 8.5 g/dL — ABNORMAL LOW (ref 13.0–17.0)
MCH: 27.4 pg (ref 26.0–34.0)
MCHC: 29.7 g/dL — ABNORMAL LOW (ref 30.0–36.0)
MCV: 92.3 fL (ref 80.0–100.0)
Platelets: 129 10*3/uL — ABNORMAL LOW (ref 150–400)
RBC: 3.1 MIL/uL — ABNORMAL LOW (ref 4.22–5.81)
RDW: 18.7 % — ABNORMAL HIGH (ref 11.5–15.5)
WBC: 5.8 10*3/uL (ref 4.0–10.5)
nRBC: 0 % (ref 0.0–0.2)

## 2021-06-25 LAB — RENAL FUNCTION PANEL
Albumin: 2.4 g/dL — ABNORMAL LOW (ref 3.5–5.0)
Anion gap: 9 (ref 5–15)
BUN: 79 mg/dL — ABNORMAL HIGH (ref 8–23)
CO2: 30 mmol/L (ref 22–32)
Calcium: 10.7 mg/dL — ABNORMAL HIGH (ref 8.9–10.3)
Chloride: 94 mmol/L — ABNORMAL LOW (ref 98–111)
Creatinine, Ser: 6.45 mg/dL — ABNORMAL HIGH (ref 0.61–1.24)
GFR, Estimated: 9 mL/min — ABNORMAL LOW (ref >60)
Glucose, Bld: 102 mg/dL — ABNORMAL HIGH (ref 70–99)
Phosphorus: 2.3 mg/dL — ABNORMAL LOW (ref 2.5–4.6)
Potassium: 4.7 mmol/L (ref 3.5–5.1)
Sodium: 133 mmol/L — ABNORMAL LOW (ref 135–145)
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
## 2021-06-27 ENCOUNTER — Ambulatory Visit: Payer: Medicare Other | Admitting: Podiatry

## 2021-07-10 ENCOUNTER — Other Ambulatory Visit: Payer: Self-pay

## 2021-07-10 ENCOUNTER — Inpatient Hospital Stay
Admission: EM | Admit: 2021-07-10 | Discharge: 2021-07-12 | DRG: 070 | Discharge status: Against Medical Advice | Payer: Medicare Other | Attending: Internal Medicine | Admitting: Internal Medicine

## 2021-07-10 ENCOUNTER — Encounter: Payer: Self-pay | Admitting: Internal Medicine

## 2021-07-10 DIAGNOSIS — D631 Anemia in chronic kidney disease: Secondary | ICD-10-CM | POA: Diagnosis present

## 2021-07-10 DIAGNOSIS — Z992 Dependence on renal dialysis: Secondary | ICD-10-CM

## 2021-07-10 DIAGNOSIS — Z87892 Personal history of anaphylaxis: Secondary | ICD-10-CM

## 2021-07-10 DIAGNOSIS — K219 Gastro-esophageal reflux disease without esophagitis: Secondary | ICD-10-CM | POA: Diagnosis present

## 2021-07-10 DIAGNOSIS — I252 Old myocardial infarction: Secondary | ICD-10-CM

## 2021-07-10 DIAGNOSIS — F32A Depression, unspecified: Secondary | ICD-10-CM | POA: Diagnosis present

## 2021-07-10 DIAGNOSIS — Z8673 Personal history of transient ischemic attack (TIA), and cerebral infarction without residual deficits: Secondary | ICD-10-CM

## 2021-07-10 DIAGNOSIS — I10 Essential (primary) hypertension: Secondary | ICD-10-CM

## 2021-07-10 DIAGNOSIS — Z79899 Other long term (current) drug therapy: Secondary | ICD-10-CM | POA: Diagnosis not present

## 2021-07-10 DIAGNOSIS — E782 Mixed hyperlipidemia: Secondary | ICD-10-CM | POA: Diagnosis present

## 2021-07-10 DIAGNOSIS — Z20822 Contact with and (suspected) exposure to covid-19: Secondary | ICD-10-CM | POA: Diagnosis present

## 2021-07-10 DIAGNOSIS — Z833 Family history of diabetes mellitus: Secondary | ICD-10-CM

## 2021-07-10 DIAGNOSIS — N186 End stage renal disease: Secondary | ICD-10-CM | POA: Diagnosis present

## 2021-07-10 DIAGNOSIS — S06309S Unspecified focal traumatic brain injury with loss of consciousness of unspecified duration, sequela: Secondary | ICD-10-CM | POA: Diagnosis not present

## 2021-07-10 DIAGNOSIS — Z8616 Personal history of COVID-19: Secondary | ICD-10-CM | POA: Diagnosis not present

## 2021-07-10 DIAGNOSIS — G9349 Other encephalopathy: Secondary | ICD-10-CM | POA: Diagnosis present

## 2021-07-10 DIAGNOSIS — Z9115 Patient's noncompliance with renal dialysis: Secondary | ICD-10-CM

## 2021-07-10 DIAGNOSIS — Z7982 Long term (current) use of aspirin: Secondary | ICD-10-CM

## 2021-07-10 DIAGNOSIS — Z5329 Procedure and treatment not carried out because of patient's decision for other reasons: Secondary | ICD-10-CM | POA: Diagnosis not present

## 2021-07-10 DIAGNOSIS — Z794 Long term (current) use of insulin: Secondary | ICD-10-CM | POA: Diagnosis not present

## 2021-07-10 DIAGNOSIS — Z8249 Family history of ischemic heart disease and other diseases of the circulatory system: Secondary | ICD-10-CM

## 2021-07-10 DIAGNOSIS — A192 Acute miliary tuberculosis, unspecified: Secondary | ICD-10-CM

## 2021-07-10 DIAGNOSIS — Z881 Allergy status to other antibiotic agents status: Secondary | ICD-10-CM

## 2021-07-10 DIAGNOSIS — E559 Vitamin D deficiency, unspecified: Secondary | ICD-10-CM | POA: Diagnosis present

## 2021-07-10 DIAGNOSIS — E1122 Type 2 diabetes mellitus with diabetic chronic kidney disease: Secondary | ICD-10-CM | POA: Diagnosis present

## 2021-07-10 DIAGNOSIS — I120 Hypertensive chronic kidney disease with stage 5 chronic kidney disease or end stage renal disease: Secondary | ICD-10-CM | POA: Diagnosis present

## 2021-07-10 DIAGNOSIS — Z931 Gastrostomy status: Secondary | ICD-10-CM

## 2021-07-10 DIAGNOSIS — Z888 Allergy status to other drugs, medicaments and biological substances status: Secondary | ICD-10-CM

## 2021-07-10 DIAGNOSIS — I2510 Atherosclerotic heart disease of native coronary artery without angina pectoris: Secondary | ICD-10-CM | POA: Diagnosis present

## 2021-07-10 DIAGNOSIS — E876 Hypokalemia: Secondary | ICD-10-CM | POA: Diagnosis present

## 2021-07-10 DIAGNOSIS — Z880 Allergy status to penicillin: Secondary | ICD-10-CM

## 2021-07-10 DIAGNOSIS — S0630AA Unspecified focal traumatic brain injury with loss of consciousness status unknown, initial encounter: Secondary | ICD-10-CM | POA: Diagnosis present

## 2021-07-10 DIAGNOSIS — N2581 Secondary hyperparathyroidism of renal origin: Secondary | ICD-10-CM | POA: Diagnosis present

## 2021-07-10 DIAGNOSIS — Z7951 Long term (current) use of inhaled steroids: Secondary | ICD-10-CM | POA: Diagnosis not present

## 2021-07-10 DIAGNOSIS — R4182 Altered mental status, unspecified: Secondary | ICD-10-CM | POA: Diagnosis present

## 2021-07-10 DIAGNOSIS — R410 Disorientation, unspecified: Secondary | ICD-10-CM | POA: Diagnosis not present

## 2021-07-10 LAB — BASIC METABOLIC PANEL
Anion gap: 9 (ref 5–15)
BUN: 51 mg/dL — ABNORMAL HIGH (ref 8–23)
CO2: 28 mmol/L (ref 22–32)
Calcium: 9.7 mg/dL (ref 8.9–10.3)
Chloride: 97 mmol/L — ABNORMAL LOW (ref 98–111)
Creatinine, Ser: 10.21 mg/dL — ABNORMAL HIGH (ref 0.61–1.24)
GFR, Estimated: 5 mL/min — ABNORMAL LOW (ref >60)
Glucose, Bld: 120 mg/dL — ABNORMAL HIGH (ref 70–99)
Potassium: 4.6 mmol/L (ref 3.5–5.1)
Sodium: 134 mmol/L — ABNORMAL LOW (ref 135–145)

## 2021-07-10 LAB — CBC
HCT: 32.5 % — ABNORMAL LOW (ref 39.0–52.0)
Hemoglobin: 9.8 g/dL — ABNORMAL LOW (ref 13.0–17.0)
MCH: 27.1 pg (ref 26.0–34.0)
MCHC: 30.2 g/dL (ref 30.0–36.0)
MCV: 89.8 fL (ref 80.0–100.0)
Platelets: 145 10*3/uL — ABNORMAL LOW (ref 150–400)
RBC: 3.62 MIL/uL — ABNORMAL LOW (ref 4.22–5.81)
RDW: 18.2 % — ABNORMAL HIGH (ref 11.5–15.5)
WBC: 4.5 10*3/uL (ref 4.0–10.5)
nRBC: 0 % (ref 0.0–0.2)

## 2021-07-10 LAB — RESP PANEL BY RT-PCR (FLU A&B, COVID) ARPGX2
Influenza A by PCR: NEGATIVE
Influenza B by PCR: NEGATIVE
SARS Coronavirus 2 by RT PCR: NEGATIVE

## 2021-07-10 LAB — CBG MONITORING, ED: Glucose-Capillary: 92 mg/dL (ref 70–99)

## 2021-07-10 MED ORDER — INSULIN ASPART 100 UNIT/ML IJ SOLN: 0.0000 [IU] | INTRAMUSCULAR | Status: DC

## 2021-07-10 MED ORDER — ALBUTEROL SULFATE (2.5 MG/3ML) 0.083% IN NEBU: 2.5000 mL | INHALATION_SOLUTION | Freq: Four times a day (QID) | RESPIRATORY_TRACT | Status: DC | PRN

## 2021-07-10 MED ORDER — MOMETASONE FURO-FORMOTEROL FUM 100-5 MCG/ACT IN AERO
2.0000 | INHALATION_SPRAY | Freq: Two times a day (BID) | RESPIRATORY_TRACT | Status: DC
  Administered 2021-07-11 – 2021-07-12 (×3): 2 via RESPIRATORY_TRACT
  Filled 2021-07-10: qty 8.8

## 2021-07-10 MED ORDER — ACETAMINOPHEN 325 MG PO TABS
650.0000 mg | ORAL_TABLET | Freq: Four times a day (QID) | ORAL | Status: DC | PRN
  Filled 2021-07-10: qty 2

## 2021-07-10 MED ORDER — MIDODRINE HCL 5 MG PO TABS
2.5000 mg | ORAL_TABLET | Freq: Three times a day (TID) | ORAL | Status: DC
  Administered 2021-07-10 – 2021-07-12 (×2): 2.5 mg via JEJUNOSTOMY
  Filled 2021-07-10 (×3): qty 1

## 2021-07-10 MED ORDER — SODIUM CHLORIDE 0.9% FLUSH
3.0000 mL | Freq: Two times a day (BID) | INTRAVENOUS | Status: DC
  Administered 2021-07-10 – 2021-07-12 (×4): 3 mL via INTRAVENOUS

## 2021-07-10 MED ORDER — SODIUM CHLORIDE 0.9 % IV SOLN: 250.0000 mL | INTRAVENOUS | Status: DC | PRN

## 2021-07-10 MED ORDER — SENNA 8.6 MG PO TABS
2.0000 | ORAL_TABLET | Freq: Every day | ORAL | Status: DC
  Administered 2021-07-10 – 2021-07-12 (×3): 17.2 mg via JEJUNOSTOMY
  Filled 2021-07-10 (×3): qty 2

## 2021-07-10 MED ORDER — ASPIRIN 81 MG PO CHEW
81.0000 mg | CHEWABLE_TABLET | Freq: Every day | ORAL | Status: DC
Start: 2021-07-10 — End: 2021-07-12
  Administered 2021-07-10 – 2021-07-12 (×2): 81 mg via JEJUNOSTOMY
  Filled 2021-07-10 (×3): qty 1

## 2021-07-10 MED ORDER — QUETIAPINE FUMARATE 25 MG PO TABS
25.0000 mg | ORAL_TABLET | Freq: Every day | ORAL | Status: DC
Start: 2021-07-10 — End: 2021-07-12
  Administered 2021-07-10 – 2021-07-11 (×2): 25 mg via JEJUNOSTOMY
  Filled 2021-07-10 (×2): qty 1

## 2021-07-10 MED ORDER — PANTOPRAZOLE 2 MG/ML SUSPENSION
40.0000 mg | Freq: Every day | ORAL | Status: DC
  Administered 2021-07-12: 40 mg
  Filled 2021-07-10 (×2): qty 20

## 2021-07-10 MED ORDER — PREGABALIN 75 MG PO CAPS: 150.0000 mg | ORAL_CAPSULE | Freq: Two times a day (BID) | ORAL | Status: DC

## 2021-07-10 MED ORDER — ONDANSETRON HCL 4 MG PO TABS: 4.0000 mg | ORAL_TABLET | Freq: Four times a day (QID) | ORAL | Status: DC | PRN

## 2021-07-10 MED ORDER — ONDANSETRON HCL 4 MG/2ML IJ SOLN: 4.0000 mg | Freq: Four times a day (QID) | INTRAMUSCULAR | Status: DC | PRN

## 2021-07-10 MED ORDER — HEPARIN SODIUM (PORCINE) 5000 UNIT/ML IJ SOLN
5000.0000 [IU] | Freq: Three times a day (TID) | INTRAMUSCULAR | Status: DC
  Administered 2021-07-10 – 2021-07-12 (×3): 5000 [IU] via SUBCUTANEOUS
  Filled 2021-07-10 (×4): qty 1

## 2021-07-10 MED ORDER — VENLAFAXINE HCL 25 MG PO TABS
25.0000 mg | ORAL_TABLET | Freq: Two times a day (BID) | ORAL | Status: DC
Start: 2021-07-10 — End: 2021-07-12
  Administered 2021-07-10 – 2021-07-12 (×3): 25 mg via JEJUNOSTOMY
  Filled 2021-07-10 (×6): qty 1

## 2021-07-10 MED ORDER — LANSOPRAZOLE 3 MG/ML SUSP: 15.0000 mg | Freq: Every day | ORAL | Status: DC

## 2021-07-10 MED ORDER — ACETAMINOPHEN 650 MG RE SUPP: 650.0000 mg | Freq: Four times a day (QID) | RECTAL | Status: DC | PRN

## 2021-07-10 MED ORDER — SODIUM CHLORIDE 0.9% FLUSH: 3.0000 mL | INTRAVENOUS | Status: DC | PRN

## 2021-07-10 MED ORDER — PREGABALIN 75 MG PO CAPS
150.0000 mg | ORAL_CAPSULE | Freq: Two times a day (BID) | ORAL | Status: DC
  Administered 2021-07-10 – 2021-07-12 (×4): 150 mg via ORAL
  Filled 2021-07-10 (×4): qty 2
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
## 2021-07-10 NOTE — ED Notes (Signed)
IV start attempted x 2 unsuccessfully. IV team consult put in at this time.

## 2021-07-10 NOTE — ED Provider Notes (Signed)
Emergency Medicine Provider Triage Evaluation Note  Donald Silva, a 65 y.o. male  was evaluated in triage.  Pt complains of presents to the ED from rehab.  Patient has a dialysis on Monday and [place].  He is in between dialysis clinics and was advised to come to the ED for intermittent hemodialysis.  Medical history is consistent with a stroke as well as 3 L O2 chronically.  Patient is without complaints and the wife is present endorses that the patient is groggy.  Review of Systems  Positive: Hemodialysis  Negative: FCS  Physical Exam  BP (!) 162/81 (BP Location: Left Arm)   Pulse 67   Temp 98.3 F (36.8 C) (Oral)   Resp 18   Ht 5\' 10"  (1.778 m)   Wt 102.5 kg   SpO2 100%   BMI 32.42 kg/m  Gen:   Awake, no distress  NAD Resp:  Normal effort CTA MSK:   Moves extremities without difficulty  Other:  CVS: RRR  Medical Decision Making  Medically screening exam initiated at 11:44 AM.  Appropriate orders placed.  Donald Silva was informed that the remainder of the evaluation will be completed by another provider, this initial triage assessment does not replace that evaluation, and the importance of remaining in the ED until their evaluation is complete.  Patient with ESRD, presents to the ED for interim dialysis.  Patient is currently in between dialysis clinics and was last dialyzed on Monday.   Melvenia Needles, PA-C 07/10/21 1145    Lucrezia Starch, MD 07/10/21 1343

## 2021-07-10 NOTE — ED Triage Notes (Signed)
Pt to ED via EMS with no complaints. Wife states that he is groggy and has not had dailysis since Monday is here to dialysis. VS heart rate 72,100% 3L, 177/87, CBG 112 98.5.
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
## 2021-07-10 NOTE — ED Triage Notes (Signed)
Pt here via [facility] from rehab. Pt's last dialysis day was Monday in [place]. Pt is in the process of getting set up at a new clinic and was informed to come to the ED for treatment. Pt has a hx of a stroke and a blood clot in his brain. Pt wears 3L chronically. Pt in NAD in triage.

## 2021-07-11 LAB — GLUCOSE, CAPILLARY
Glucose-Capillary: 100 mg/dL — ABNORMAL HIGH (ref 70–99)
Glucose-Capillary: 89 mg/dL (ref 70–99)

## 2021-07-11 LAB — CBG MONITORING, ED
Glucose-Capillary: 68 mg/dL — ABNORMAL LOW (ref 70–99)
Glucose-Capillary: 81 mg/dL (ref 70–99)
Glucose-Capillary: 83 mg/dL (ref 70–99)
Glucose-Capillary: 87 mg/dL (ref 70–99)
Glucose-Capillary: 94 mg/dL (ref 70–99)

## 2021-07-11 LAB — BASIC METABOLIC PANEL
Anion gap: 7 (ref 5–15)
BUN: 42 mg/dL — ABNORMAL HIGH (ref 8–23)
CO2: 30 mmol/L (ref 22–32)
Calcium: 9.2 mg/dL (ref 8.9–10.3)
Chloride: 98 mmol/L (ref 98–111)
Creatinine, Ser: 8.71 mg/dL — ABNORMAL HIGH (ref 0.61–1.24)
GFR, Estimated: 6 mL/min — ABNORMAL LOW (ref >60)
Glucose, Bld: 101 mg/dL — ABNORMAL HIGH (ref 70–99)
Potassium: 4.7 mmol/L (ref 3.5–5.1)
Sodium: 135 mmol/L (ref 135–145)

## 2021-07-11 LAB — HEPATITIS B CORE ANTIBODY, TOTAL: Hep B Core Total Ab: NONREACTIVE

## 2021-07-11 MED ORDER — LORAZEPAM 0.5 MG PO TABS
0.5000 mg | ORAL_TABLET | Freq: Three times a day (TID) | ORAL | Status: DC | PRN
  Administered 2021-07-11: 0.5 mg via ORAL
  Filled 2021-07-11 (×2): qty 1

## 2021-07-11 MED ORDER — ZINC OXIDE 40 % EX OINT
TOPICAL_OINTMENT | CUTANEOUS | Status: AC
  Filled 2021-07-11: qty 113

## 2021-07-11 MED ORDER — RENA-VITE PO TABS
1.0000 | ORAL_TABLET | Freq: Every day | ORAL | Status: DC
  Administered 2021-07-11 – 2021-07-12 (×2): 1 via ORAL
  Filled 2021-07-11 (×2): qty 1

## 2021-07-11 MED ORDER — RENA-VITE PO TABS
1.0000 | ORAL_TABLET | Freq: Every day | ORAL | Status: DC
  Filled 2021-07-11: qty 1

## 2021-07-11 MED ORDER — NEPRO/CARBSTEADY PO LIQD
237.0000 mL | Freq: Two times a day (BID) | ORAL | Status: DC
  Administered 2021-07-12: 237 mL via ORAL

## 2021-07-11 MED ORDER — CHLORHEXIDINE GLUCONATE CLOTH 2 % EX PADS
6.0000 | MEDICATED_PAD | Freq: Every day | CUTANEOUS | Status: DC
  Filled 2021-07-11 (×2): qty 6

## 2021-07-11 MED ORDER — HYDRALAZINE HCL 20 MG/ML IJ SOLN: 10.0000 mg | INTRAMUSCULAR | Status: DC | PRN

## 2021-07-11 MED ORDER — LISINOPRIL 10 MG PO TABS
40.0000 mg | ORAL_TABLET | Freq: Every day | ORAL | Status: DC
  Administered 2021-07-11 – 2021-07-12 (×2): 40 mg via ORAL
  Filled 2021-07-11 (×2): qty 4

## 2021-07-11 MED ORDER — CARVEDILOL 25 MG PO TABS
25.0000 mg | ORAL_TABLET | Freq: Two times a day (BID) | ORAL | Status: DC
  Administered 2021-07-11 – 2021-07-12 (×2): 25 mg via ORAL
  Filled 2021-07-11 (×2): qty 1

## 2021-07-11 MED ORDER — HYDRALAZINE HCL 50 MG PO TABS
100.0000 mg | ORAL_TABLET | Freq: Three times a day (TID) | ORAL | Status: DC
  Administered 2021-07-11 – 2021-07-12 (×2): 100 mg via ORAL
  Filled 2021-07-11 (×2): qty 2

## 2021-07-11 MED ORDER — SEVELAMER CARBONATE 0.8 G PO PACK
0.8000 g | PACK | Freq: Three times a day (TID) | ORAL | Status: DC
  Administered 2021-07-12: 0.8 g via ORAL
  Filled 2021-07-11 (×3): qty 1
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
## 2021-07-11 NOTE — ED Notes (Signed)
Pt repositioned in bed, meds given with applesauce and tolerated well. Bed locked and low, call light in reach. Speech therapy in with pt.

## 2021-07-11 NOTE — ED Notes (Signed)
Pt resting in bed, denies complaints at this time. Alert and oriented to all questions, Dr at bedside. Pt aware he is needing dialysis today. States he is hungry this am. Warm blanket given, brief checked, clean and dry, pillow given for comfort. Pt in NAD. Bed locked and low, call light in reach.
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
## 2021-07-11 NOTE — ED Notes (Signed)
Updated dialysis on pt's status, confirmed transport has been requested.

## 2021-07-11 NOTE — ED Notes (Signed)
Crackers, juice and peanut butter given, pt tolerated well.

## 2021-07-11 NOTE — ED Notes (Addendum)
Pt's spouse at bedside, requesting materials to bathe pt. Provided by this RN. MD notified pt's spouse at bedside and would like to speak to him.

## 2021-07-11 NOTE — ED Notes (Signed)
Bedside swallow eval done at bedside, passed. 2c of apple juice given, pt tolerated well

## 2021-07-11 NOTE — Progress Notes (Signed)
Received patient from ED, for shorten HD session, patient seen urgently for dialysis, is presently not associated with a chronic clinic. Patient has LUA AVF, bruit and thrill present that cannulates without difficulty, and maintains prescribed BFR throughout session. Patient's vital signs reflect of ESRD and dialysis patient without treatment. Patient tolerated tx well, met targeted UF with 1502 fluid removal. Patient anxious at end of session, desiring to go home. Nurse remained at bedside, encouraging patient  not  to climb over stretcher. Post treatment bleeding greater than 30 mins. Pt. transferred from dialysis suite to Room 126, report given to the primary nurse of patient.
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
## 2021-07-11 NOTE — Consult Note (Addendum)
[facility] Nurse Consult Note: Reason for Consult: Consult requested for G-tube site.  Skin surrounding is intact without breakdown.  The insertion site around the tube has red moist skin which is raised above the skin level; appearance is consistent with hypergranulation related to chronic moisture. There is a small amt tan drainage to the area.   Dressing procedure/placement/frequency: Topical treatment orders provided for bedside nurse to perform as follows to repel moisture and absorb drainage; Apply Desitin to G-tube site Q day, then cover with split thickness gauze and push bumper down to skin level. Please re-consult if further assistance is needed.  Thank-you,  Julien Girt MSN, [place], [place], [place], [place]
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
## 2021-07-12 ENCOUNTER — Inpatient Hospital Stay: Payer: Medicare Other

## 2021-07-12 LAB — BASIC METABOLIC PANEL
Anion gap: 10 (ref 5–15)
BUN: 48 mg/dL — ABNORMAL HIGH (ref 8–23)
CO2: 28 mmol/L (ref 22–32)
Calcium: 9.6 mg/dL (ref 8.9–10.3)
Chloride: 97 mmol/L — ABNORMAL LOW (ref 98–111)
Creatinine, Ser: 9.39 mg/dL — ABNORMAL HIGH (ref 0.61–1.24)
GFR, Estimated: 6 mL/min — ABNORMAL LOW (ref >60)
Glucose, Bld: 92 mg/dL (ref 70–99)
Potassium: 4.8 mmol/L (ref 3.5–5.1)
Sodium: 135 mmol/L (ref 135–145)

## 2021-07-12 LAB — GLUCOSE, CAPILLARY
Glucose-Capillary: 88 mg/dL (ref 70–99)
Glucose-Capillary: 87 mg/dL (ref 70–99)

## 2021-07-12 MED ORDER — MIDODRINE HCL 5 MG PO TABS: 2.5000 mg | ORAL_TABLET | Freq: Three times a day (TID) | ORAL | Status: DC | PRN
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
## 2021-07-12 NOTE — Progress Notes (Signed)
[name] showed up and states she was taking the patient home.  I attempted to come in the room and do patient care and she held up a finger and told me no.  She removed his telemetry box and began to get him dressed.  Dr. Vic Blackbird was informed as well as security and the [facility] came to the bedside.  The wife filled out the [facility] form for the patient, I removed the patient's IV.  Patient family placed him in a wheelchair and left with him.

## 2021-07-12 NOTE — Care Management (Signed)
I went back to speak to the patient at bedside given concerns from nursing staff that patient was attempting to get out of bed.  When I went and spoke to him he was perseverating on 1 particular pill.  He states that the doctor gave it to him.  I explained that only [place] the nephrologist had seen him this morning and neither 1 of [place] administer medications.  Bedside RN Donald Silva was with me and present for the entire conversation.  Donald Silva explained that every pill that he had provided to the patient this morning he had explained exactly what and we were unable done if I what white pill the patient was talking about.  The patient could not be redirected and did not want to comment on his wife's desire to have him discharged today.  I again explained that I could not discharge safely without a established outpatient HD chair time.  As of 12/2 that was not in place and nephrology service indicated that it would likely be through the weekend before the outpatient dialysis coordinator could arrange for an outpatient chair and time.  Shortly thereafter RN called again stating security had been called to bedside.  [name] had come to the hospital and apparently was refusing to let the nurse at bedside to remove leads.  The wife is planning to take patient out of the hospital.  Unfortunately given the circumstances will have to be Donald Silva.  Per my understanding the patient does not have an HD chair time thus should he be taken out of the hospital Donald Silva he will be at high risk for clinical deterioration and subsequent readmission.  Donald Muskrat MD

## 2021-07-12 NOTE — Care Management (Signed)
Received notification from bedside RN Einar Pheasant that patient's wife was calling and very upset since demanding that the patient be discharged today.  When I called the wife I explained to her that I spoke to the inpatient nephrology team and the outpatient dialysis coordinator on 12/2.  The patient currently per our records does not have an outpatient dialysis chair and time.  Per nephrology team recommendations we cannot discharge this patient safely until dialysis coordinator does arrange for outpatient HD chair time.  I tried to explain this to the patient's wife however she was very upset and was screaming over the phone.  I explained that though I cannot discharge and we cannot hold him against his will and if she would like him home it would need to be [place].  She inquired if EMS could pick him up and bring him back home.  I spoke with the on-call [facility] who will coordinate discharge planning with social work to see if we can set up transport for this patient.  Unfortunately because we do not have an outpatient HD chair time the discharge will have to be [place].  If the patient does leave [place] he is at high risk for deterioration and hospital readmission without an arranged dialysis time.  Ralene Muskrat MD

## 2021-07-14 LAB — GLUCOSE, CAPILLARY: Glucose-Capillary: 97 mg/dL (ref 70–99)

## 2021-08-12 ENCOUNTER — Ambulatory Visit: Payer: Commercial Managed Care - HMO | Admitting: Podiatry

## 2021-09-23 ENCOUNTER — Ambulatory Visit: Payer: Medicare Other | Admitting: Podiatry

## 2021-09-30 ENCOUNTER — Other Ambulatory Visit: Payer: Self-pay

## 2021-09-30 ENCOUNTER — Encounter: Payer: Self-pay | Admitting: Podiatry

## 2021-09-30 ENCOUNTER — Ambulatory Visit (INDEPENDENT_AMBULATORY_CARE_PROVIDER_SITE_OTHER): Payer: Medicare Other | Admitting: Podiatry

## 2021-09-30 DIAGNOSIS — L989 Disorder of the skin and subcutaneous tissue, unspecified: Secondary | ICD-10-CM | POA: Diagnosis not present

## 2021-09-30 DIAGNOSIS — B351 Tinea unguium: Secondary | ICD-10-CM | POA: Diagnosis not present

## 2021-09-30 DIAGNOSIS — E0843 Diabetes mellitus due to underlying condition with diabetic autonomic (poly)neuropathy: Secondary | ICD-10-CM

## 2021-09-30 DIAGNOSIS — M79675 Pain in left toe(s): Secondary | ICD-10-CM | POA: Diagnosis not present

## 2021-09-30 DIAGNOSIS — M79674 Pain in right toe(s): Secondary | ICD-10-CM | POA: Diagnosis not present
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
## 2021-11-03 ENCOUNTER — Other Ambulatory Visit: Payer: Self-pay

## 2021-11-03 ENCOUNTER — Ambulatory Visit: Payer: Medicare Other

## 2021-11-03 DIAGNOSIS — E0843 Diabetes mellitus due to underlying condition with diabetic autonomic (poly)neuropathy: Secondary | ICD-10-CM

## 2021-11-03 DIAGNOSIS — L97512 Non-pressure chronic ulcer of other part of right foot with fat layer exposed: Secondary | ICD-10-CM

## 2021-11-03 DIAGNOSIS — S98131S Complete traumatic amputation of one right lesser toe, sequela: Secondary | ICD-10-CM

## 2021-11-03 NOTE — Progress Notes (Signed)
SITUATION ?Reason for Consult: Evaluation for Prefabricated Diabetic Shoes and Custom Diabetic Inserts. ?Patient / Caregiver Report: Patient would like well fitting shoes ? ?OBJECTIVE DATA: ?Patient History / Diagnosis:  ?  ICD-10-CM   ?1. Diabetes mellitus due to underlying condition with diabetic autonomic neuropathy, unspecified whether long term insulin use (HCC)  E08.43   ?  ?2. 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  ?3. Ulcer of right foot with fat layer exposed ([place])  L97.512   ?  ? ?Physician Treating Diabetes:  Rosaria Ferries, MD ? ?Current or Previous Devices:   Historical User ? ?In-Person Foot Examination: ?Ulcers & Callousing:   Historical ?Deformities:    Right toe amputation ?Sensation:    Compromised  ?Shoe Size:     11W ? ?ORTHOTIC RECOMMENDATION ?Recommended Devices: ?- 1x pair prefabricated PDAC approved diabetic shoes; Patient Selected Orthofeet Sprint Blue 674 Size 11W ?- 3x pair custom-to-patient PDAC approved vacuum formed diabetic insoles. ? ?GOALS OF SHOES AND INSOLES ?- Reduce shear and pressure ?- Reduce / Prevent callus formation ?- Reduce / Prevent ulceration ?- Protect the fragile healing compromised diabetic foot. ? ?Patient would benefit from diabetic shoes and inserts as patient has diabetes mellitus and the patient has one or more of the following conditions: ?- History of partial or complete amputation of the foot ?- History of previous foot ulceration. ?- History of pre-ulcerative callus ?- Peripheral neuropathy with evidence of callus formation ?- Foot deformity ?- Poor circulation ? ?ACTIONS PERFORMED ?Potential out of pocket cost was communicated to patient. Patient understood and consented to measurement and casting. Patient was casted for insoles via crush box and measured for shoes via brannock device. Procedure was explained and patient tolerated procedure well. All questions were answered and concerns addressed. Casts were shipped  to central fabrication for HOLD until Certificate of Medical Necessity or otherwise necessary authorization from insurance is obtained. ? ?PLAN ?Shoes are to be ordered and casts released from hold once all appropriate paperwork is complete. Patient is to be contacted and scheduled for fitting once shoes and insoles have been fabricated and received. ? ?

## 2021-12-30 ENCOUNTER — Encounter: Payer: Self-pay | Admitting: Podiatry

## 2021-12-30 ENCOUNTER — Ambulatory Visit (INDEPENDENT_AMBULATORY_CARE_PROVIDER_SITE_OTHER): Payer: Medicare Other | Admitting: Podiatry

## 2021-12-30 DIAGNOSIS — L989 Disorder of the skin and subcutaneous tissue, unspecified: Secondary | ICD-10-CM | POA: Diagnosis not present

## 2021-12-30 DIAGNOSIS — E0843 Diabetes mellitus due to underlying condition with diabetic autonomic (poly)neuropathy: Secondary | ICD-10-CM

## 2021-12-30 DIAGNOSIS — B351 Tinea unguium: Secondary | ICD-10-CM

## 2021-12-30 NOTE — Progress Notes (Signed)
    Subjective: Patient is a 66 y.o. male presenting to the office today with a chief complaint of painful callus lesion(s) noted to the bilateral feet. Walking and bearing weight increases the pain.  Patient complains of elongated, thickened nails that cause pain while ambulating in shoes. He is unable to trim his own nails. Patient presents today for further treatment and evaluation.  Past Medical History:  Diagnosis Date   CKD (chronic kidney disease) stage 5, GFR less than 15 ml/min (HCC)    COVID-19    Diabetes mellitus without complication (HCC)    Dyspnea    GERD (gastroesophageal reflux disease)    Hypertension    Lower extremity edema    Myocardial infarction (HCC)     Objective:  Physical Exam General: Alert and oriented x3 in no acute distress  Dermatology: Hyperkeratotic lesion(s) present on the bilateral feet. Pain on palpation with a central nucleated core noted. Skin is warm, dry and supple bilateral lower extremities. Negative for open lesions or macerations. Nails are tender, long, thickened and dystrophic with subungual debris, consistent with onychomycosis, 1-5 left, 2-4 right. No signs of infection noted.  Vascular: Palpable pedal pulses bilaterally. No edema or erythema noted. Capillary refill within normal limits.  Neurological: Epicritic and protective threshold diminished bilaterally.   Musculoskeletal Exam: Pain on palpation at the keratotic lesion(s) noted. 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 Onychodystrophic nails 1-5 bilateral with hyperkeratosis of nails.  2. Onychomycosis of nail due to dermatophyte bilateral 3. Pre-ulcerative callus lesions noted to the bilateral feet x 4 4. 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 Patient evaluated. 2. Excisional debridement of keratoic lesion(s) using a chisel blade was performed without incident as a courtesy to the patient 3. Dressed with light dressing. 4. Mechanical  debridement of nails 1-5 bilaterally performed using a nail nipper. Filed with dremel without incident.  5.  Continue diabetic shoes 6.  Patient is to return to the clinic in 3 months.   Wilgus Deyton M. Omar Gayden, DPM [facility]  Dr. Kyron Schlitt M. Jalani Cullifer, DPM    [address]                                    [place], [place] [address]                Office [phone]  Fax [phone] 

## 2022-01-02 ENCOUNTER — Other Ambulatory Visit: Payer: Self-pay | Admitting: Neurology

## 2022-01-02 DIAGNOSIS — Z8673 Personal history of transient ischemic attack (TIA), and cerebral infarction without residual deficits: Secondary | ICD-10-CM

## 2022-01-02 DIAGNOSIS — Z8679 Personal history of other diseases of the circulatory system: Secondary | ICD-10-CM

## 2022-02-09 ENCOUNTER — Other Ambulatory Visit: Payer: Self-pay | Admitting: Specialist

## 2022-02-09 DIAGNOSIS — R918 Other nonspecific abnormal finding of lung field: Secondary | ICD-10-CM

## 2022-02-09 DIAGNOSIS — R0602 Shortness of breath: Secondary | ICD-10-CM

## 2022-02-20 ENCOUNTER — Ambulatory Visit
Admission: RE | Admit: 2022-02-20 | Discharge: 2022-02-20 | Disposition: A | Discharge status: Home / Self Care | Payer: Medicare Other | Source: Ambulatory Visit | Attending: Specialist | Admitting: Specialist

## 2022-02-20 DIAGNOSIS — R918 Other nonspecific abnormal finding of lung field: Secondary | ICD-10-CM

## 2022-02-20 DIAGNOSIS — R0602 Shortness of breath: Secondary | ICD-10-CM

## 2022-04-07 ENCOUNTER — Ambulatory Visit (INDEPENDENT_AMBULATORY_CARE_PROVIDER_SITE_OTHER): Payer: Medicare Other | Admitting: Podiatry

## 2022-04-07 DIAGNOSIS — B351 Tinea unguium: Secondary | ICD-10-CM

## 2022-04-07 DIAGNOSIS — L989 Disorder of the skin and subcutaneous tissue, unspecified: Secondary | ICD-10-CM | POA: Diagnosis not present

## 2022-04-07 DIAGNOSIS — M79675 Pain in left toe(s): Secondary | ICD-10-CM | POA: Diagnosis not present

## 2022-04-07 DIAGNOSIS — M79674 Pain in right toe(s): Secondary | ICD-10-CM

## 2022-04-07 DIAGNOSIS — E0843 Diabetes mellitus due to underlying condition with diabetic autonomic (poly)neuropathy: Secondary | ICD-10-CM | POA: Diagnosis not present

## 2022-04-07 NOTE — Progress Notes (Signed)
    Subjective: Patient is a 66 y.o. male presenting to the office today with a chief complaint of painful callus lesion(s) noted to the bilateral feet. Walking and bearing weight increases the pain.  Patient complains of elongated, thickened nails that cause pain while ambulating in shoes. He is unable to trim his own nails. Patient presents today for further treatment and evaluation.  Past Medical History:  Diagnosis Date   CKD (chronic kidney disease) stage 5, GFR less than 15 ml/min (HCC)    COVID-19    Diabetes mellitus without complication (HCC)    Dyspnea    GERD (gastroesophageal reflux disease)    Hypertension    Lower extremity edema    Myocardial infarction (HCC)     Objective:  Physical Exam General: Alert and oriented x3 in no acute distress  Dermatology: Hyperkeratotic lesion(s) present on the bilateral feet. Pain on palpation with a central nucleated core noted. Skin is warm, dry and supple bilateral lower extremities. Negative for open lesions or macerations. Nails are tender, long, thickened and dystrophic with subungual debris, consistent with onychomycosis, 1-5 left, 2-4 right. No signs of infection noted.  Vascular: Palpable pedal pulses bilaterally. No edema or erythema noted. Capillary refill within normal limits.  Neurological: Epicritic and protective threshold diminished bilaterally.   Musculoskeletal Exam: Pain on palpation at the keratotic lesion(s) noted. 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 Onychodystrophic nails 1-5 bilateral with hyperkeratosis of nails.  2. Onychomycosis of nail due to dermatophyte bilateral 3. Pre-ulcerative callus lesions noted to the bilateral feet x 4 4. 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 Patient evaluated. 2. Excisional debridement of keratoic lesion(s) using a chisel blade was performed without incident as a courtesy to the patient 3. Dressed with light dressing. 4. Mechanical  debridement of nails 1-5 bilaterally performed using a nail nipper. Filed with dremel without incident.  5.  Continue diabetic shoes 6.  Patient is to return to the clinic in 3 months.   Zailey Audia M. Tyheim Vanalstyne, DPM [facility]  Dr. Elanor Cale M. Lyle Leisner, DPM    [address]                                    [place], [place] [address]                Office [phone]  Fax [phone] 

## 2022-04-30 ENCOUNTER — Telehealth: Payer: Self-pay | Admitting: Podiatry

## 2022-04-30 NOTE — Telephone Encounter (Signed)
Pts wife called and left message @ 1229 today upset because pt has not been able to get his diabetic shoes and it has been 5 months. She said the doctors office has tried to contact the dept and no one is answering or returning calls. She needs the paperwork sent to Dr Vista Mink asap.

## 2022-05-01 NOTE — Telephone Encounter (Signed)
Please f/u. Thanks Dr. Amalia Hailey

## 2022-06-08 ENCOUNTER — Encounter (INDEPENDENT_AMBULATORY_CARE_PROVIDER_SITE_OTHER): Payer: Self-pay

## 2022-06-18 NOTE — Telephone Encounter (Signed)
Spoke with pts wife and informed her that the faxes have been sent over every week since 04/16/22 but we have not gotten it back yet. Pts wife is going to contact the treating office and call back tomorrow.

## 2022-06-25 ENCOUNTER — Telehealth: Payer: Self-pay | Admitting: Podiatry

## 2022-06-25 NOTE — Telephone Encounter (Signed)
[facility] called left message on voicemail, they have not received paperwork for patients  diabetic shoes - can you refax the paperwork to [phone]

## 2022-07-07 ENCOUNTER — Ambulatory Visit (INDEPENDENT_AMBULATORY_CARE_PROVIDER_SITE_OTHER): Payer: Self-pay | Admitting: Podiatry

## 2022-07-07 DIAGNOSIS — Z91199 Patient's noncompliance with other medical treatment and regimen due to unspecified reason: Secondary | ICD-10-CM

## 2022-07-07 NOTE — Progress Notes (Signed)
   Complete physical exam  Patient: Donald Silva   DOB: 05/30/2001   66 y.o. Male  MRN: [id number]  Subjective:    No chief complaint on file.   Donald Silva is a 66 y.o. male who presents today for a complete physical exam. She reports consuming a {diet types:17450} diet. {types:19826} She generally feels {DESC; WELL/FAIRLY WELL/POORLY:18703}. She reports sleeping {DESC; WELL/FAIRLY WELL/POORLY:18703}. She {does/does not:200015} have additional problems to discuss today.    Most recent fall risk assessment:    02/04/2022   10:42 AM  Fall Risk   Falls in the past year? 0  Number falls in past yr: 0  Injury with Fall? 0  Risk for fall due to : No Fall Risks  Follow up Falls evaluation completed     Most recent depression screenings:    02/04/2022   10:42 AM 12/26/2020   10:46 AM  PHQ 2/9 Scores  PHQ - 2 Score 0 0  PHQ- 9 Score 5     {VISON DENTAL STD PSA (Optional):27386}  {History (Optional):23778}  Patient Care Team: Jessup, Joy, NP as PCP - General (Nurse Practitioner)   Outpatient Medications Prior to Visit  Medication Sig   fluticasone (FLONASE) 50 MCG/ACT nasal spray Place 2 sprays into both nostrils in the morning and at bedtime. After 7 days, reduce to once daily.   norgestimate-ethinyl estradiol (SPRINTEC 28) 0.25-35 MG-MCG tablet Take 1 tablet by mouth daily.   Nystatin POWD Apply liberally to affected area 2 times per day   spironolactone (ALDACTONE) 100 MG tablet Take 1 tablet (100 mg total) by mouth daily.   No facility-administered medications prior to visit.    ROS        Objective:     There were no vitals taken for this visit. {Vitals History (Optional):23777}  Physical Exam   No results found for any visits on 03/12/22. {Show previous labs (optional):23779}    Assessment & Plan:    Routine Health Maintenance and Physical Exam  Immunization History  Administered Date(s) Administered   DTaP 08/13/1999, 10/09/1999,  12/18/1999, 09/02/2000, 03/18/2004   Hepatitis A 01/13/2008, 01/18/2009   Hepatitis B 05/31/1999, 07/08/1999, 12/18/1999   HiB (PRP-OMP) 08/13/1999, 10/09/1999, 12/18/1999, 09/02/2000   IPV 08/13/1999, 10/09/1999, 06/07/2000, 03/18/2004   Influenza,inj,Quad PF,6+ Mos 04/20/2014   Influenza-Unspecified 07/20/2012   MMR 06/07/2001, 03/18/2004   Meningococcal Polysaccharide 01/18/2012   Pneumococcal Conjugate-13 09/02/2000   Pneumococcal-Unspecified 12/18/1999, 03/02/2000   Tdap 01/18/2012   Varicella 06/07/2000, 01/13/2008    Health Maintenance  Topic Date Due   HIV Screening  Never done   Hepatitis C Screening  Never done   INFLUENZA VACCINE  03/10/2022   PAP-Cervical Cytology Screening  03/12/2022 (Originally 05/29/2020)   PAP SMEAR-Modifier  03/12/2022 (Originally 05/29/2020)   TETANUS/TDAP  03/12/2022 (Originally 01/17/2022)   HPV VACCINES  Discontinued   COVID-19 Vaccine  Discontinued    Discussed health benefits of physical activity, and encouraged her to engage in regular exercise appropriate for her age and condition.  Problem List Items Addressed This Visit   None Visit Diagnoses     Annual physical exam    -  Primary   Cervical cancer screening       Need for Tdap vaccination          No follow-ups on file.     Joy Jessup, NP   

## 2022-07-14 ENCOUNTER — Ambulatory Visit (INDEPENDENT_AMBULATORY_CARE_PROVIDER_SITE_OTHER): Payer: Medicare Other | Admitting: Podiatry

## 2022-07-14 DIAGNOSIS — M79674 Pain in right toe(s): Secondary | ICD-10-CM

## 2022-07-14 DIAGNOSIS — B351 Tinea unguium: Secondary | ICD-10-CM | POA: Diagnosis not present

## 2022-07-14 DIAGNOSIS — M79675 Pain in left toe(s): Secondary | ICD-10-CM

## 2022-07-14 NOTE — Progress Notes (Signed)
    Subjective: Patient is a 66 y.o. male presenting to the office today with a chief complaint of painful callus lesion(s) noted to the bilateral feet. Walking and bearing weight increases the pain.  Patient complains of elongated, thickened nails that cause pain while ambulating in shoes. He is unable to trim his own nails. Patient presents today for further treatment and evaluation.  Past Medical History:  Diagnosis Date   CKD (chronic kidney disease) stage 5, GFR less than 15 ml/min (HCC)    COVID-19    Diabetes mellitus without complication (HCC)    Dyspnea    GERD (gastroesophageal reflux disease)    Hypertension    Lower extremity edema    Myocardial infarction ([place])     Objective:  Physical Exam General: Alert and oriented x3 in no acute distress  Dermatology: Hyperkeratotic lesion(s) present on the bilateral feet. Pain on palpation with a central nucleated core noted. Skin is warm, dry and supple bilateral lower extremities. Negative for open lesions or macerations. Nails are tender, long, thickened and dystrophic with subungual debris, consistent with onychomycosis, 1-5 left, 2-4 right. No signs of infection noted.  Vascular: Palpable pedal pulses bilaterally. No edema or erythema noted. Capillary refill within normal limits.  Neurological: Epicritic and protective threshold diminished bilaterally.   Musculoskeletal Exam: Pain on palpation at the keratotic lesion(s) noted. 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 Onychodystrophic nails 1-5 bilateral with hyperkeratosis of nails.  2. Onychomycosis of nail due to dermatophyte bilateral 3. Pre-ulcerative callus lesions noted to the bilateral feet x 4 4. 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 Patient evaluated. 2. Excisional debridement of keratoic lesion(s) using a chisel blade was performed without incident as a courtesy to the patient 3. Dressed with light dressing. 4. Mechanical  debridement of nails 1-5 bilaterally performed using a nail nipper. Filed with dremel without incident.  5.  Continue diabetic shoes 6.  Patient is to return to the clinic in 3 months.   Edrick Kins, DPM [facility]  Dr. Edrick Kins, DPM    [address], [place] [address]                Office [phone]  Fax [phone]

## 2022-08-23 ENCOUNTER — Emergency Department
Admission: EM | Admit: 2022-08-23 | Discharge: 2022-08-23 | Disposition: A | Discharge status: Home / Self Care | Payer: 59

## 2022-08-23 NOTE — ED Notes (Addendum)
First Nurse note:  Pt wife brought pt up here to get assistance into getting him into a nursing home as she cannot take care of him anymore- pt has been more agitated and aggressive and is not sleeping at night- charge nurse aware of situation

## 2022-08-23 NOTE — ED Notes (Signed)
Pt is A&O x4 and is able to make his own decisions- pt does not want to be here and does not want to be seen by a Dr

## 2022-10-13 ENCOUNTER — Ambulatory Visit: Payer: 59 | Admitting: Podiatry
